# Patient Record
Sex: Female | Born: 1955 | Race: Black or African American | Hispanic: No | Marital: Single | Smoking: Former smoker
Health system: Southern US, Community
[De-identification: ages and names within clinical notes are randomized; demographics above are authoritative.]

## PROBLEM LIST (undated history)

## (undated) DIAGNOSIS — I10 Essential (primary) hypertension: Secondary | ICD-10-CM

## (undated) DIAGNOSIS — F32A Depression, unspecified: Secondary | ICD-10-CM

## (undated) DIAGNOSIS — J449 Chronic obstructive pulmonary disease, unspecified: Secondary | ICD-10-CM

## (undated) DIAGNOSIS — F329 Major depressive disorder, single episode, unspecified: Secondary | ICD-10-CM

## (undated) DIAGNOSIS — M1990 Unspecified osteoarthritis, unspecified site: Secondary | ICD-10-CM

## (undated) HISTORY — DX: Unspecified osteoarthritis, unspecified site: M19.90

## (undated) HISTORY — PX: ECTOPIC PREGNANCY SURGERY: SHX613

## (undated) HISTORY — PX: ABDOMINAL HYSTERECTOMY: SHX81

---

## 2009-02-09 ENCOUNTER — Emergency Department (HOSPITAL_COMMUNITY): Admission: EM | Admit: 2009-02-09 | Discharge: 2009-02-09 | Payer: Self-pay | Admitting: Emergency Medicine

## 2009-08-26 IMAGING — CR DG CHEST 2V
2 series · 2 of 2 positions shown · non-contrast
Comparison: None

CLINICAL DATA: Syncope

CHEST - 2 VIEW

[w chest pa]
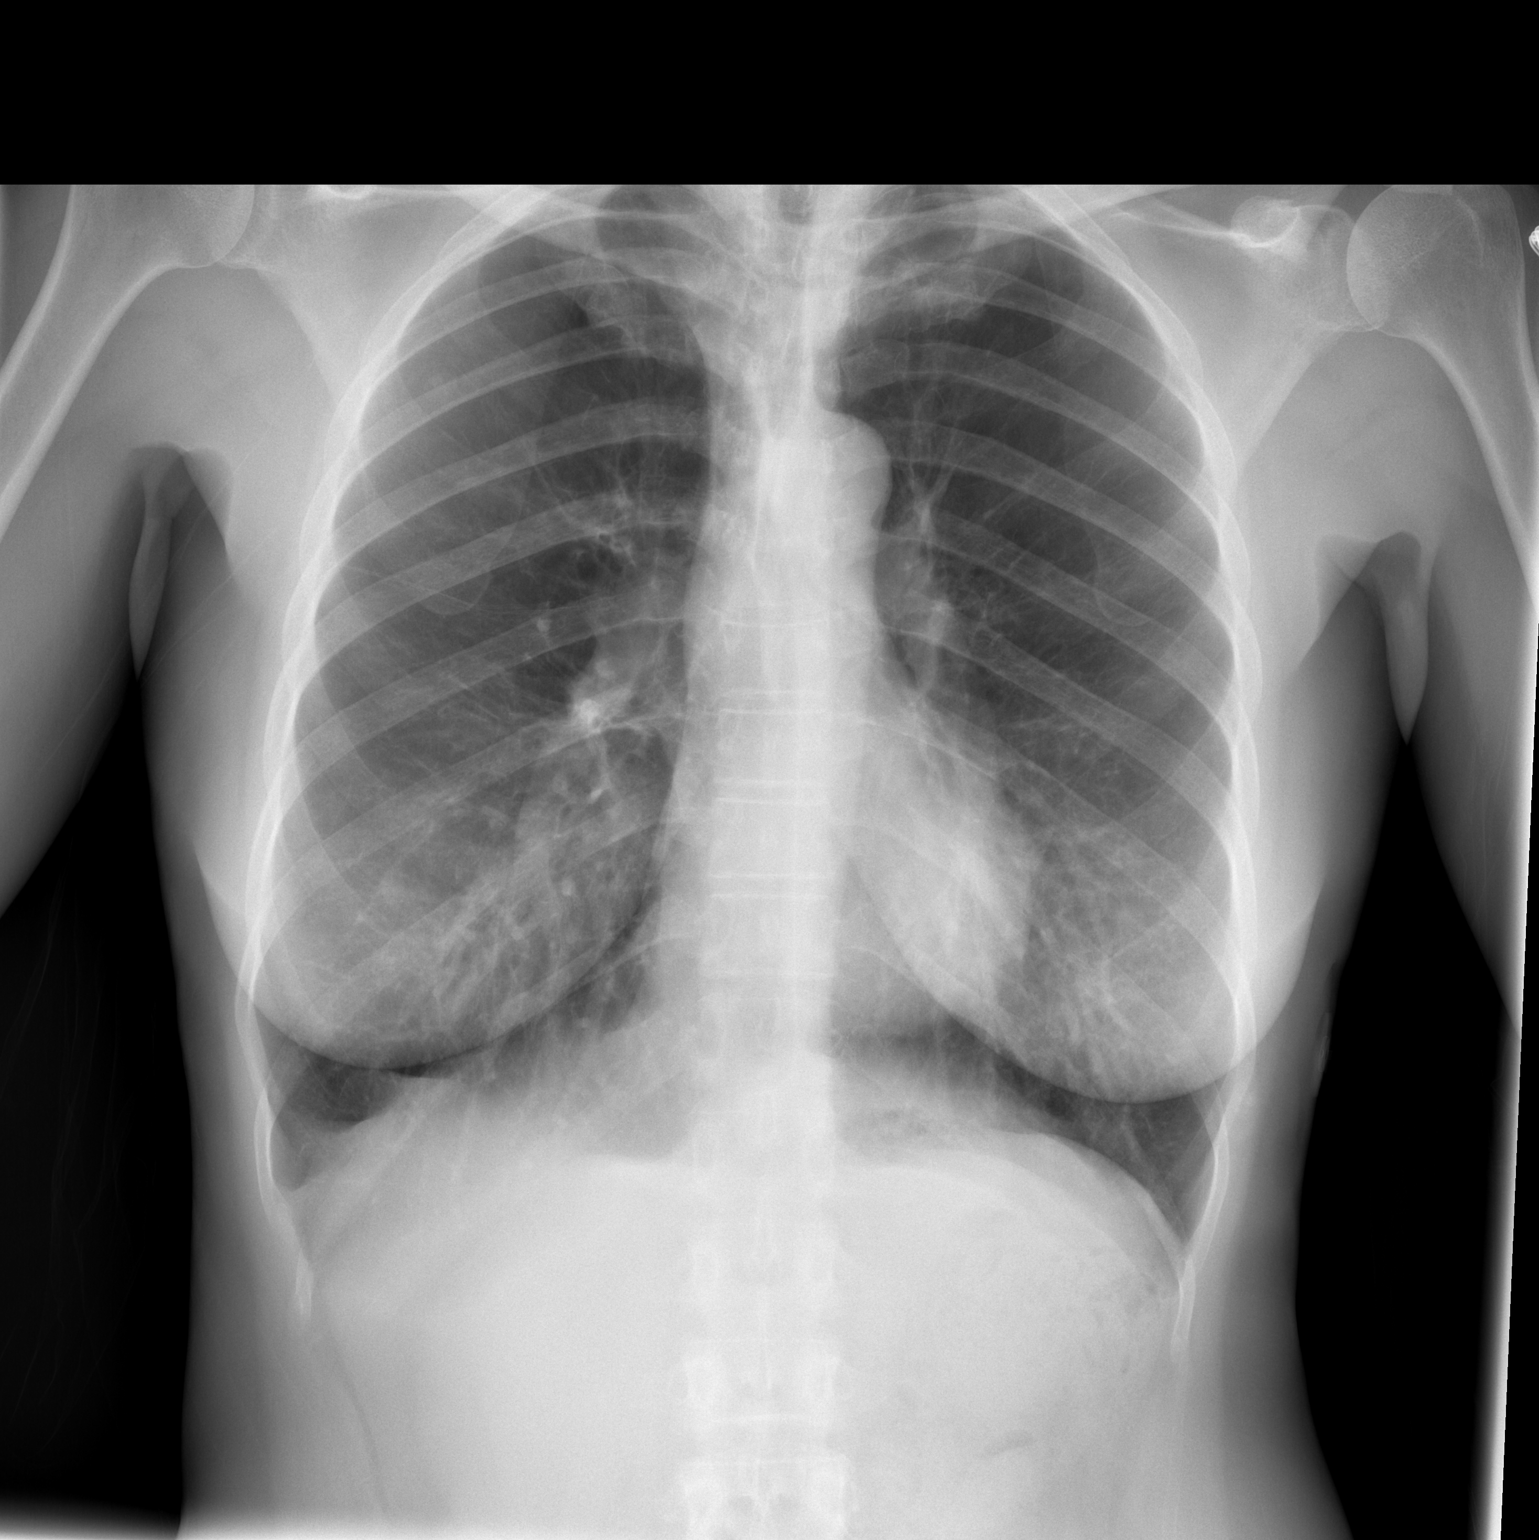

[w chest lat]
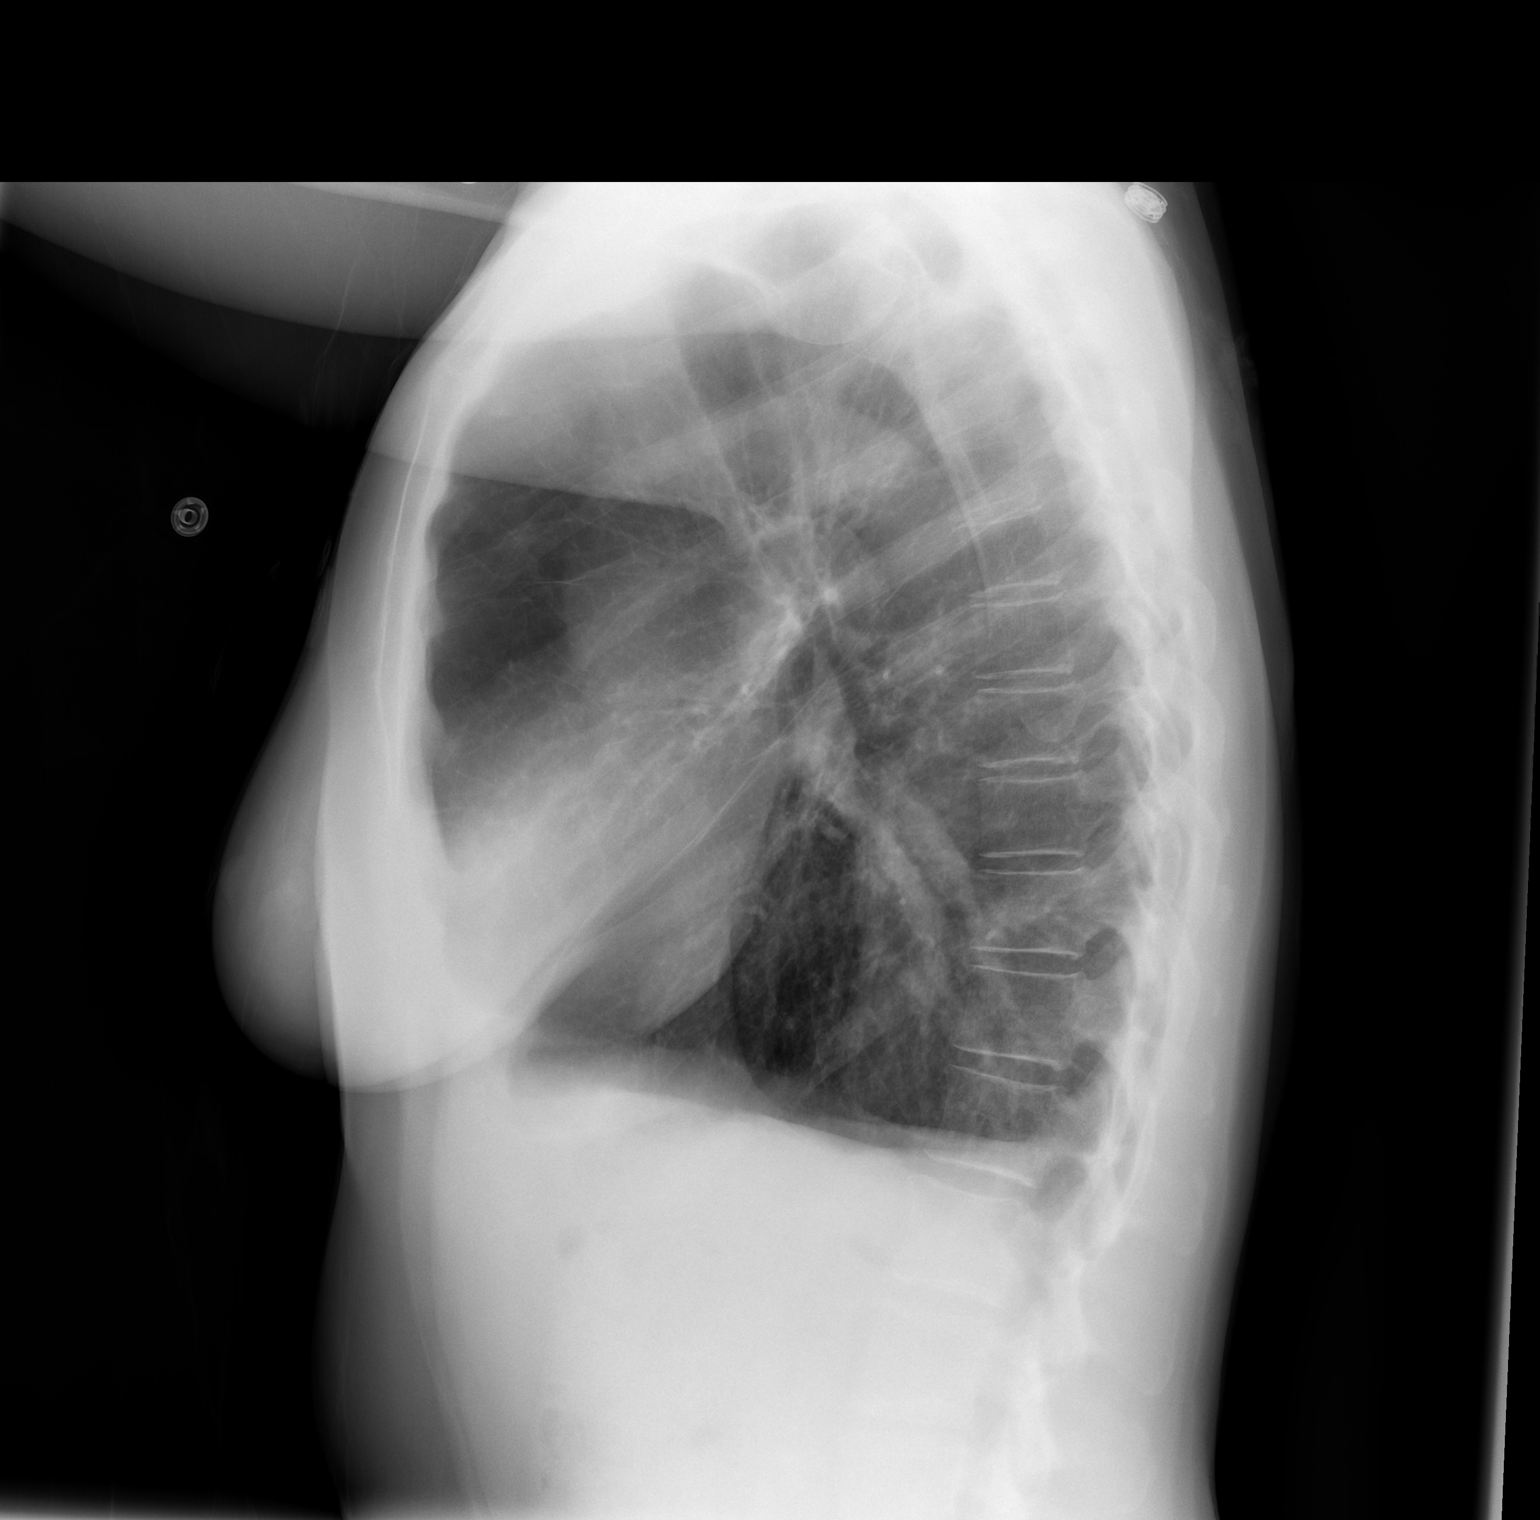

[2 of 2 positions shown; findings below may reference images not displayed]

FINDINGS: Both lungs are hyperinflated.  Flattening of the
hemidiaphragms and increased AP diameter of the chest are noted.
Attenuation of the lung markings in the upper lung fields is
present.  All of these findings suggest underlying COPD.  The heart
and mediastinal contours are within normal limits.  There is mild
bronchial wall thickening.  No airspace disease, effusion,
lymphadenopathy or pneumothorax is identified.

No acute osseous abnormality identified.
IMPRESSION: COPD.

## 2011-01-11 LAB — POCT I-STAT, CHEM 8
Calcium, Ion: 1.01 mmol/L — ABNORMAL LOW (ref 1.12–1.32)
Chloride: 102 mEq/L (ref 96–112)
HCT: 39 % (ref 36.0–46.0)
Potassium: 3.8 mEq/L (ref 3.5–5.1)

## 2011-01-11 LAB — CBC
MCHC: 33.4 g/dL (ref 30.0–36.0)
RDW: 13.3 % (ref 11.5–15.5)

## 2011-01-11 LAB — DIFFERENTIAL
Basophils Absolute: 0 10*3/uL (ref 0.0–0.1)
Basophils Relative: 0 % (ref 0–1)
Neutro Abs: 10.7 10*3/uL — ABNORMAL HIGH (ref 1.7–7.7)
Neutrophils Relative %: 91 % — ABNORMAL HIGH (ref 43–77)

## 2013-02-04 ENCOUNTER — Inpatient Hospital Stay: Payer: Self-pay | Admitting: Internal Medicine

## 2013-02-04 LAB — COMPREHENSIVE METABOLIC PANEL
Alkaline Phosphatase: 56 U/L (ref 50–136)
BUN: 6 mg/dL — ABNORMAL LOW (ref 7–18)
Bilirubin,Total: 0.4 mg/dL (ref 0.2–1.0)
Creatinine: 0.52 mg/dL — ABNORMAL LOW (ref 0.60–1.30)
EGFR (Non-African Amer.): >60
Glucose: 114 mg/dL — ABNORMAL HIGH (ref 65–99)
Osmolality: 278 (ref 275–301)
SGOT(AST): 43 U/L — ABNORMAL HIGH (ref 15–37)
SGPT (ALT): 37 U/L (ref 12–78)
Sodium: 140 mmol/L (ref 136–145)
Total Protein: 7.7 g/dL (ref 6.4–8.2)

## 2013-02-04 LAB — CBC
HGB: 13.9 g/dL (ref 12.0–16.0)
MCHC: 33.3 g/dL (ref 32.0–36.0)
MCV: 87 fL (ref 80–100)
RBC: 4.82 10*6/uL (ref 3.80–5.20)
RDW: 14.2 % (ref 11.5–14.5)
WBC: 3.1 10*3/uL — ABNORMAL LOW (ref 3.6–11.0)

## 2013-02-04 LAB — MAGNESIUM: Magnesium: 1.8 mg/dL

## 2013-02-04 LAB — TROPONIN I: Troponin-I: <0.02 ng/mL

## 2013-02-05 LAB — BASIC METABOLIC PANEL
Anion Gap: 6 — ABNORMAL LOW (ref 7–16)
BUN: 14 mg/dL (ref 7–18)
Calcium, Total: 9.1 mg/dL (ref 8.5–10.1)
Glucose: 144 mg/dL — ABNORMAL HIGH (ref 65–99)
Potassium: 4.1 mmol/L (ref 3.5–5.1)
Sodium: 141 mmol/L (ref 136–145)

## 2013-02-05 LAB — CBC WITH DIFFERENTIAL/PLATELET
Basophil %: 0.4 %
Eosinophil #: 0 10*3/uL (ref 0.0–0.7)
Eosinophil %: 0.1 %
MCHC: 33.2 g/dL (ref 32.0–36.0)
MCV: 87 fL (ref 80–100)
Monocyte #: 0.2 x10 3/mm (ref 0.2–0.9)
Monocyte %: 4.4 %
Neutrophil #: 2.9 10*3/uL (ref 1.4–6.5)
Neutrophil %: 78.8 %
Platelet: 261 10*3/uL (ref 150–440)
RBC: 4.48 10*6/uL (ref 3.80–5.20)
RDW: 14.3 % (ref 11.5–14.5)

## 2013-02-05 LAB — LIPID PANEL
Ldl Cholesterol, Calc: 128 mg/dL — ABNORMAL HIGH (ref 0–100)
VLDL Cholesterol, Calc: 10 mg/dL (ref 5–40)

## 2013-08-21 IMAGING — CR DG CHEST 1V PORT
1 series · 2 of 2 positions shown · non-contrast
Comparison: none

REASON FOR EXAM: dyspnea
COMMENTS:

PROCEDURE:     DXR - DXR PORTABLE CHEST SINGLE VIEW  - February 04, 2013 [DATE]
RESULT:     Comparison: None.

[Series 1: ap · 0.17mm/px · 2 of 2 slices shown]
[im 1/2]
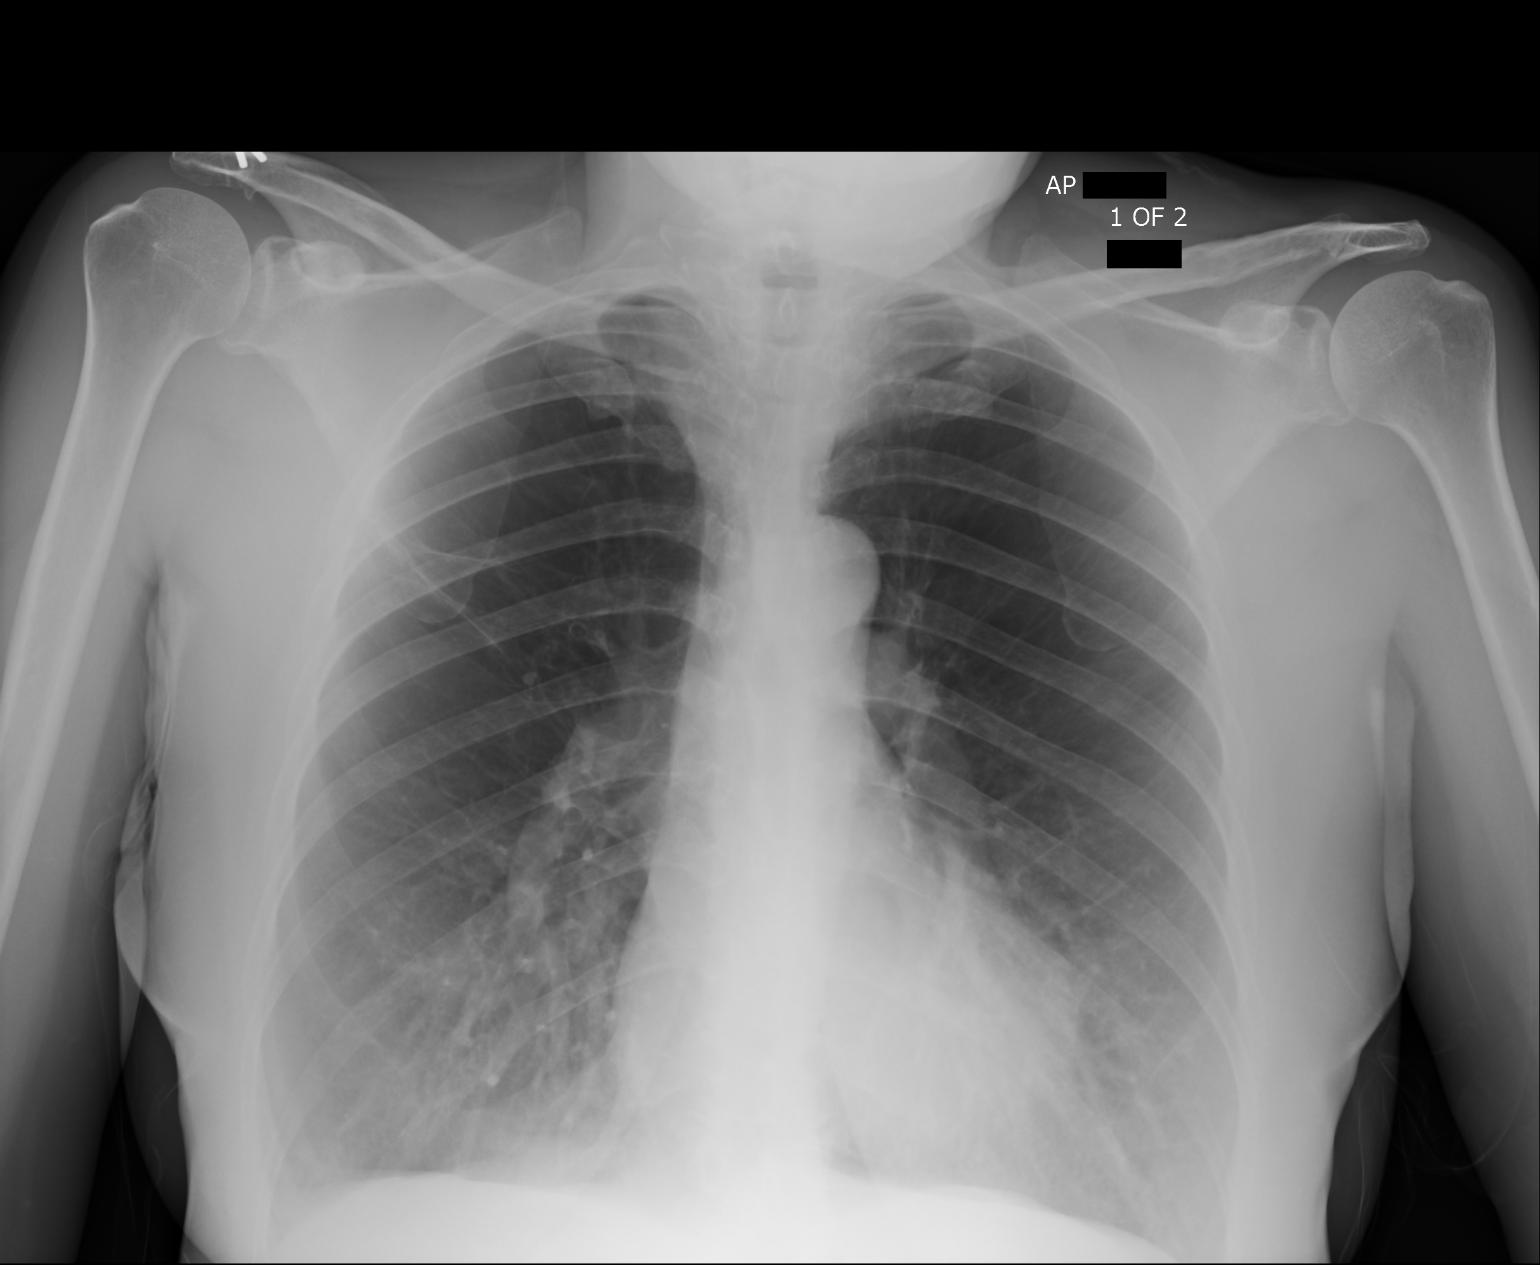
[im 2/2]
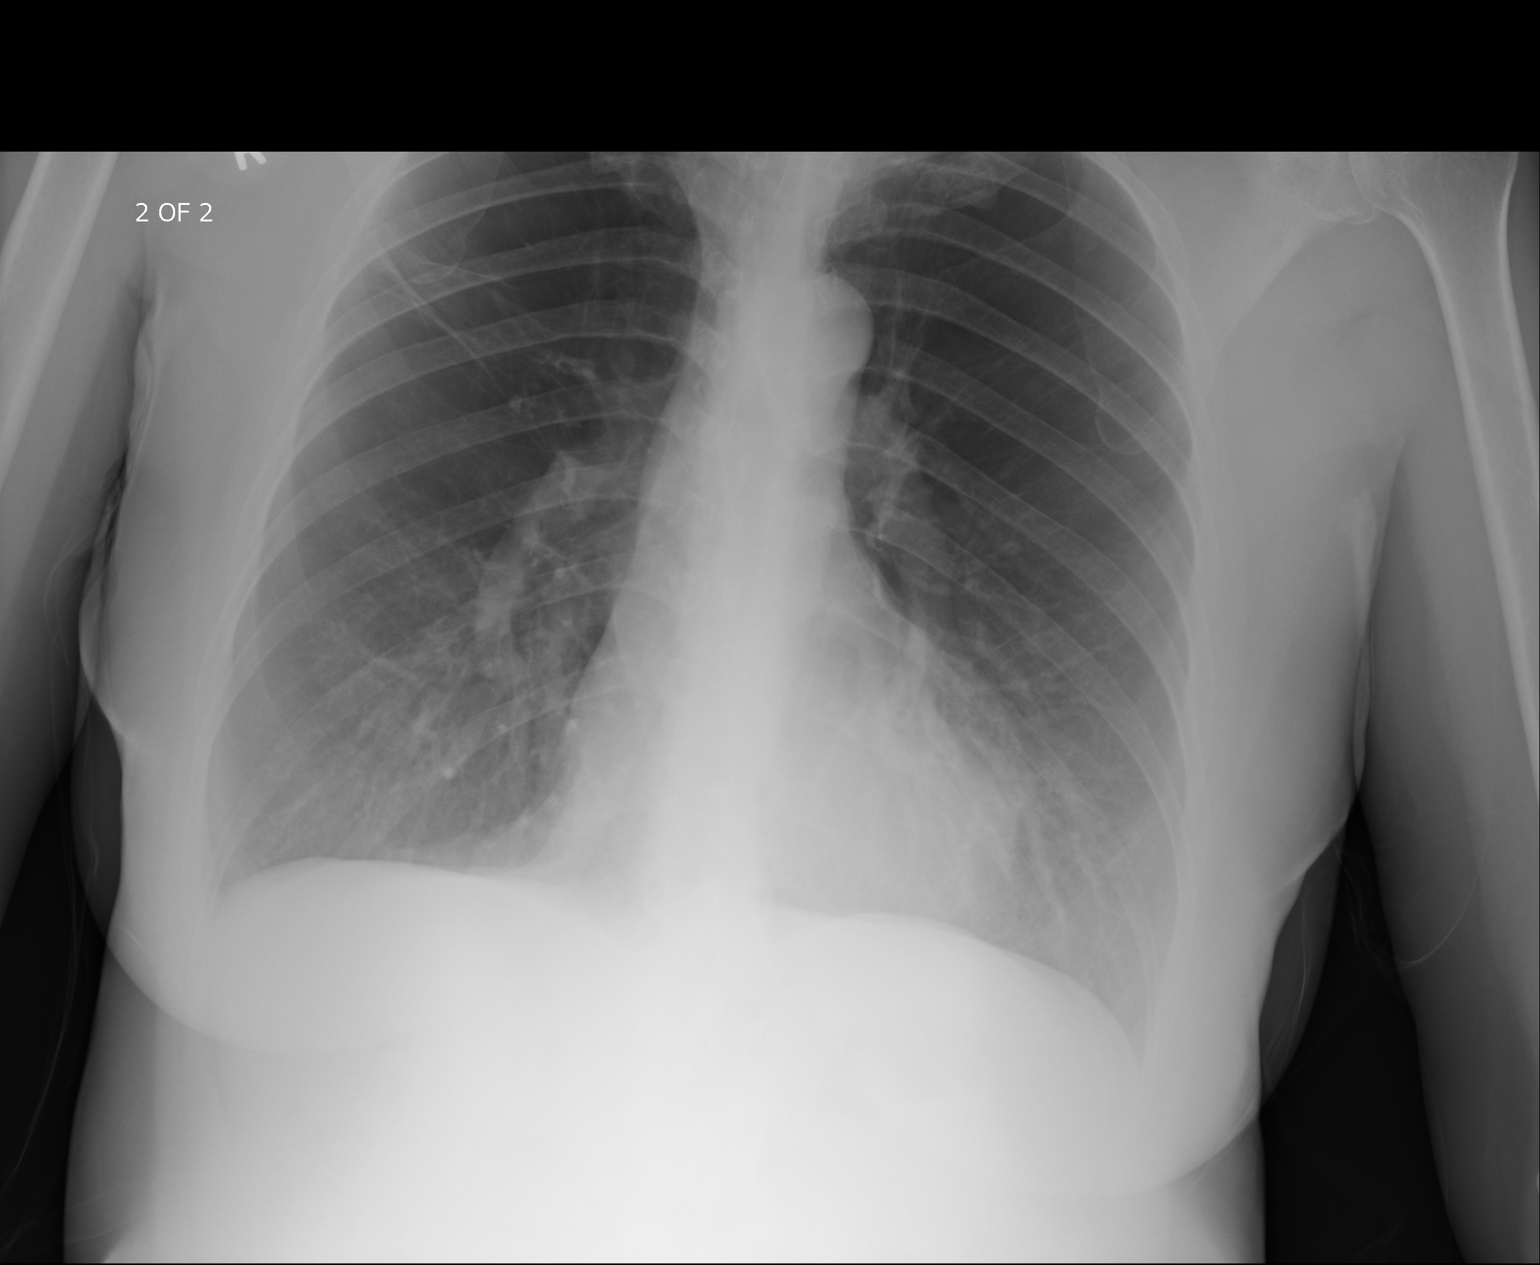

[2 of 2 positions shown; findings below may reference images not displayed]

FINDINGS: The heart and mediastinum are within normal limits. The lungs are
hyperinflated. Fine reticular opacities in the right mid upper lung are
likely secondary to atelectasis or scarring. Otherwise, no focal pulmonary
opacities.
IMPRESSION: Findings of COPD. Otherwise, no acute cardiopulmonary disease.

[REDACTED]

## 2013-08-26 IMAGING — CT CT CHEST W/ CM
1 series · 15 of 32 positions shown, 19 images · IV contrast (agent unspecified)
Comparison: none

REASON FOR EXAM: r/o PE, worsening shortness of breath and hypoxia
COMMENTS:

PROCEDURE:     CT  - CT CHEST WITH CONTRAST  - February 09, 2013 [DATE]
RESULT:
TECHNIQUE: Helical 3 mm sections were obtained from the thoracic inlet
through the lung bases status post intravenous administration of 100 mL of
Wsovue-CBQ.

[Series 7: lung windows · axial · 0.93mm/px · z∈[+784,+1036]mm · 15 of 96 slices shown, 19 images]
[im 8/96  mediastinal]
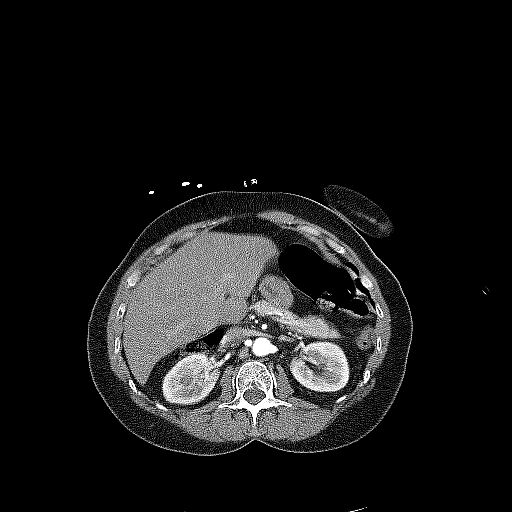
[im 8/96  lung]
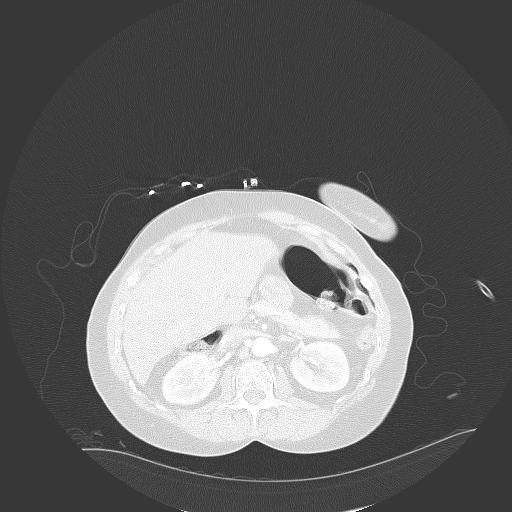
[im 15/96  lung]
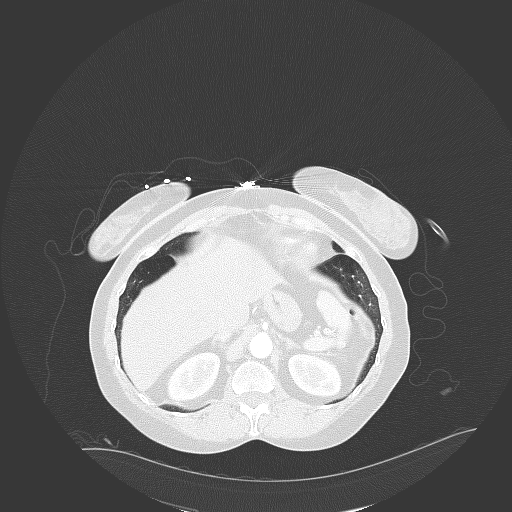
[im 20/96  lung]
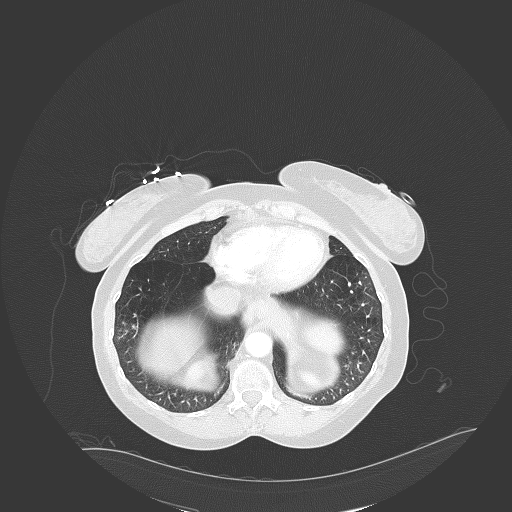
[im 25/96  lung]
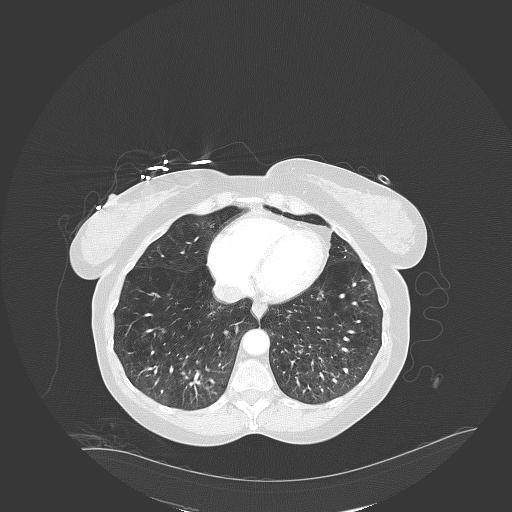
[im 32/96  mediastinal]
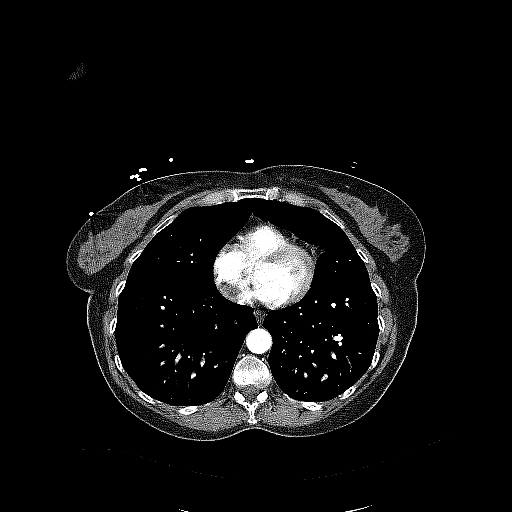
[im 32/96  lung]
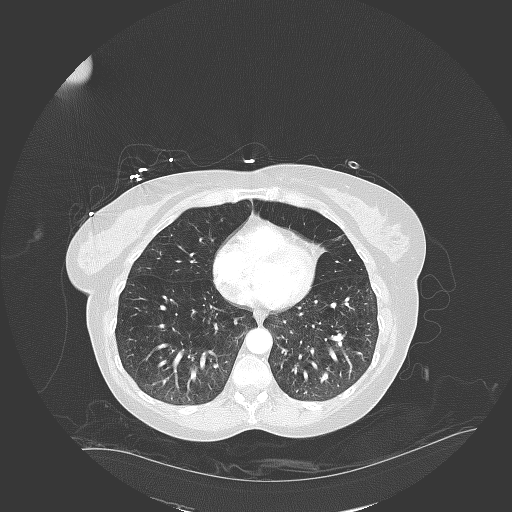
[im 39/96  lung]
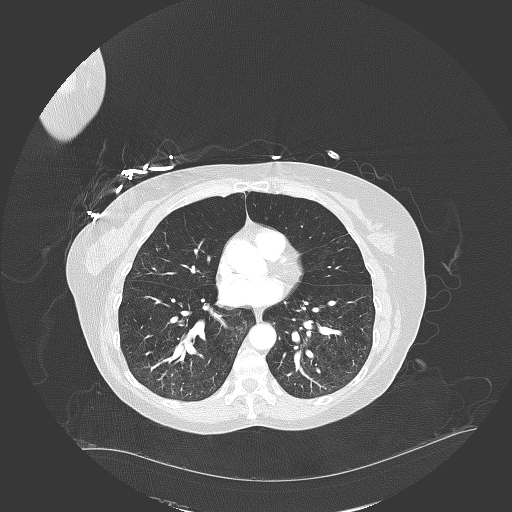
[im 46/96  lung]
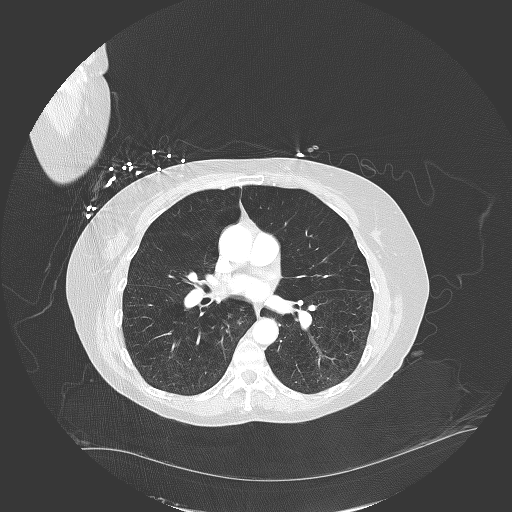
[im 51/96  lung]
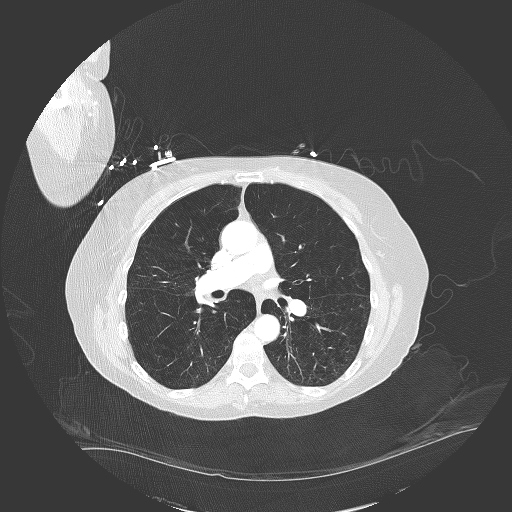
[im 57/96  mediastinal]
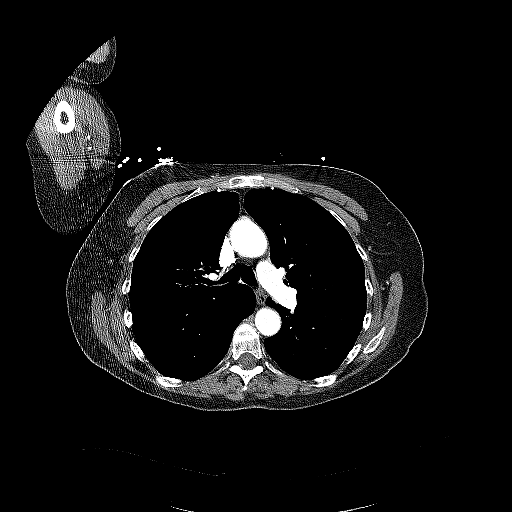
[im 57/96  lung]
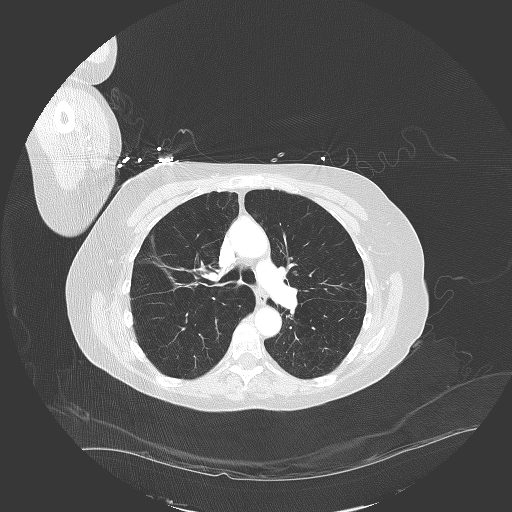
[im 60/96  lung]
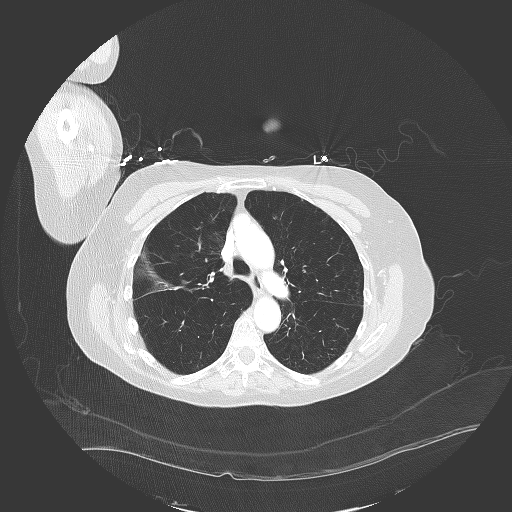
[im 67/96  lung]
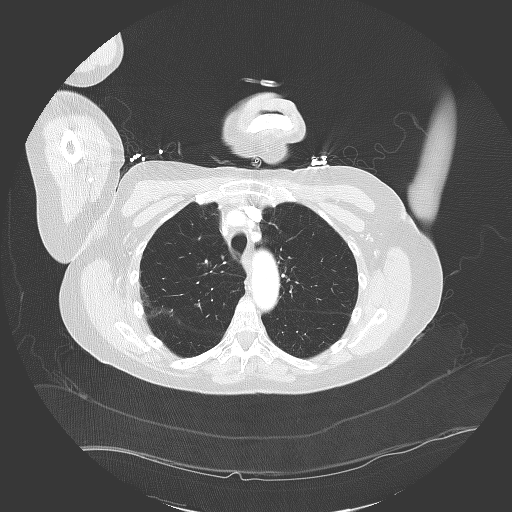
[im 74/96  lung]
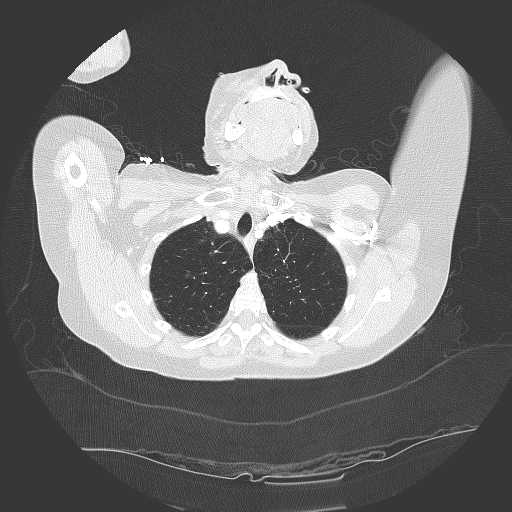
[im 78/96  mediastinal]
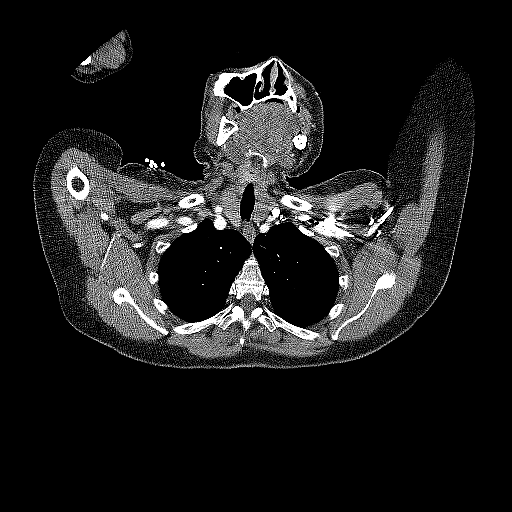
[im 78/96  lung]
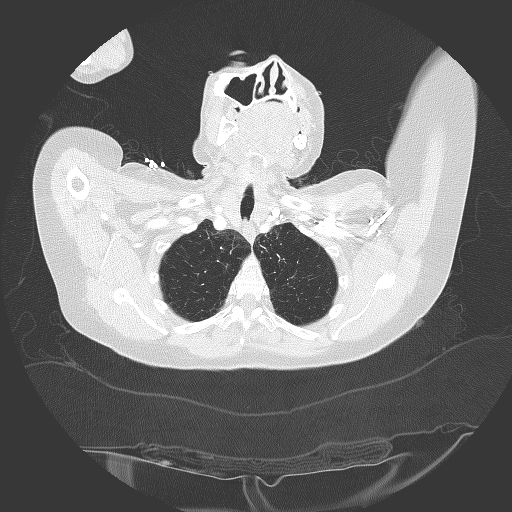
[im 85/96  lung]
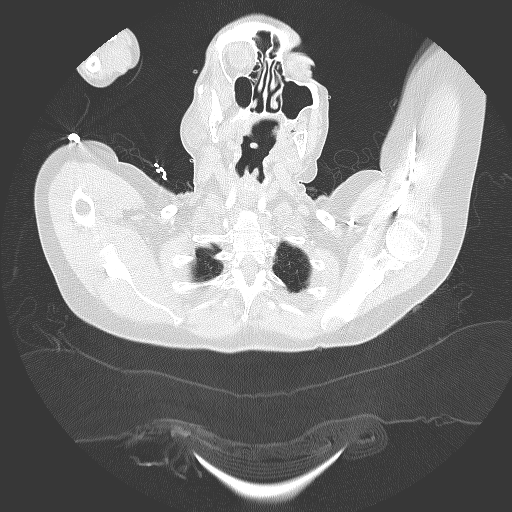
[im 92/96  lung]
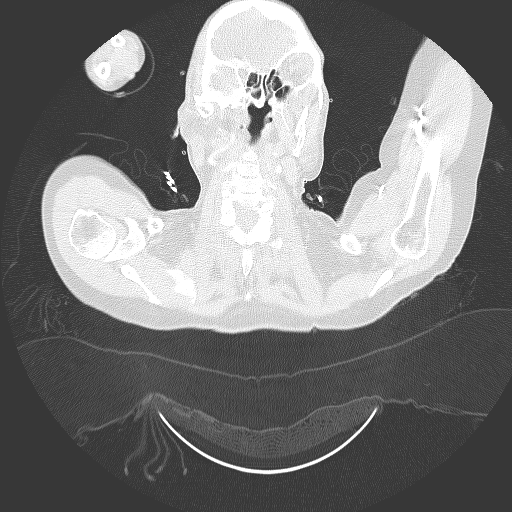

[15 of 32 positions shown; findings below may reference images not displayed]

FINDINGS: Evaluation of the oropharynx demonstrates findings which may
represent increased soft tissue density along the posterolateral
parapharyngeal region on the left. This finding may be secondary to
positioning and clinical correlation recommended.

The mediastinum and hilar regions and structures demonstrate no evidence of
mediastinal or hilar masses or adenopathy. There is no evidence of filling
defects within the main, lobar, or segmental pulmonary arteries.

Emphysematous changes are identified throughout both lungs with greatest
confluence in the lung apices. Interstitial and emphysematous changes are
identified within the lung bases. A 7.5 mm nodule projects in the posterior
to central portion of the left lower lobe in the region of the medial median
basal segment measured on image #75 of the lung window series. A linear area
of increased density projects within the lateral segment right middle lobe.

The visualized upper abdominal viscera demonstrate low attenuating foci
within the kidneys like representing cysts and otherwise grossly
unremarkable.
IMPRESSION: 1. No CT evidence of pulmonary arterial embolic disease.
2. Moderate emphysematous changes within the upper lobe regions.
3. Moderate emphysematous and interstitial changes within the lung bases.
4. Indeterminate pulmonary nodule left lung base. Clinical correlation
recommended and, if warranted, surveillance evaluation is recommended.
5. Likely scarring within the right middle lobe.

## 2013-11-09 ENCOUNTER — Inpatient Hospital Stay: Payer: Self-pay | Admitting: Internal Medicine

## 2013-11-09 LAB — COMPREHENSIVE METABOLIC PANEL
ALT: 18 U/L (ref 12–78)
ANION GAP: 9 (ref 7–16)
AST: 23 U/L (ref 15–37)
Albumin: 3 g/dL — ABNORMAL LOW (ref 3.4–5.0)
Alkaline Phosphatase: 99 U/L
BUN: 18 mg/dL (ref 7–18)
Bilirubin,Total: 1.1 mg/dL — ABNORMAL HIGH (ref 0.2–1.0)
CALCIUM: 9 mg/dL (ref 8.5–10.1)
Chloride: 97 mmol/L — ABNORMAL LOW (ref 98–107)
Co2: 25 mmol/L (ref 21–32)
Creatinine: 0.91 mg/dL (ref 0.60–1.30)
EGFR (Non-African Amer.): >60
Glucose: 97 mg/dL (ref 65–99)
OSMOLALITY: 264 (ref 275–301)
Potassium: 3.2 mmol/L — ABNORMAL LOW (ref 3.5–5.1)
SODIUM: 131 mmol/L — AB (ref 136–145)
TOTAL PROTEIN: 7.5 g/dL (ref 6.4–8.2)

## 2013-11-09 LAB — CBC WITH DIFFERENTIAL/PLATELET
Basophil #: 0.1 10*3/uL (ref 0.0–0.1)
Basophil %: 0.5 %
EOS ABS: 0 10*3/uL (ref 0.0–0.7)
Eosinophil %: 0 %
HCT: 39.6 % (ref 35.0–47.0)
HGB: 13 g/dL (ref 12.0–16.0)
Lymphocyte #: 0.9 10*3/uL — ABNORMAL LOW (ref 1.0–3.6)
Lymphocyte %: 3.9 %
MCH: 28 pg (ref 26.0–34.0)
MCHC: 32.8 g/dL (ref 32.0–36.0)
MCV: 86 fL (ref 80–100)
MONOS PCT: 3.7 %
Monocyte #: 0.9 x10 3/mm (ref 0.2–0.9)
NEUTROS ABS: 21.8 10*3/uL — AB (ref 1.4–6.5)
Neutrophil %: 91.9 %
Platelet: 238 10*3/uL (ref 150–440)
RBC: 4.63 10*6/uL (ref 3.80–5.20)
RDW: 15 % — ABNORMAL HIGH (ref 11.5–14.5)
WBC: 23.8 10*3/uL — AB (ref 3.6–11.0)

## 2013-11-09 LAB — TROPONIN I: Troponin-I: <0.02 ng/mL

## 2013-11-10 LAB — CBC WITH DIFFERENTIAL/PLATELET
BASOS ABS: 0.1 10*3/uL (ref 0.0–0.1)
Basophil %: 0.5 %
EOS PCT: 0 %
Eosinophil #: 0 10*3/uL (ref 0.0–0.7)
HCT: 38.8 % (ref 35.0–47.0)
HGB: 12.7 g/dL (ref 12.0–16.0)
Lymphocyte #: 0.9 10*3/uL — ABNORMAL LOW (ref 1.0–3.6)
Lymphocyte %: 4.4 %
MCH: 28.7 pg (ref 26.0–34.0)
MCHC: 32.7 g/dL (ref 32.0–36.0)
MCV: 88 fL (ref 80–100)
MONO ABS: 0.8 x10 3/mm (ref 0.2–0.9)
Monocyte %: 3.6 %
NEUTROS PCT: 91.5 %
Neutrophil #: 19.5 10*3/uL — ABNORMAL HIGH (ref 1.4–6.5)
PLATELETS: 214 10*3/uL (ref 150–440)
RBC: 4.42 10*6/uL (ref 3.80–5.20)
RDW: 15.3 % — ABNORMAL HIGH (ref 11.5–14.5)
WBC: 21.3 10*3/uL — AB (ref 3.6–11.0)

## 2013-11-10 LAB — BASIC METABOLIC PANEL
ANION GAP: 15 (ref 7–16)
BUN: 19 mg/dL — ABNORMAL HIGH (ref 7–18)
CHLORIDE: 100 mmol/L (ref 98–107)
CREATININE: 1.05 mg/dL (ref 0.60–1.30)
Calcium, Total: 8.7 mg/dL (ref 8.5–10.1)
Co2: 20 mmol/L — ABNORMAL LOW (ref 21–32)
GFR CALC NON AF AMER: 58 — AB
Glucose: 72 mg/dL (ref 65–99)
OSMOLALITY: 271 (ref 275–301)
Potassium: 2.9 mmol/L — ABNORMAL LOW (ref 3.5–5.1)
Sodium: 135 mmol/L — ABNORMAL LOW (ref 136–145)

## 2013-11-10 LAB — CK-MB
CK-MB: 1.8 ng/mL (ref 0.5–3.6)
CK-MB: 2.6 ng/mL (ref 0.5–3.6)

## 2013-11-10 LAB — THEOPHYLLINE LEVEL: Theophylline: 7.2 ug/mL — ABNORMAL LOW (ref 10.0–20.0)

## 2013-11-10 LAB — TROPONIN I

## 2013-11-10 LAB — MAGNESIUM: MAGNESIUM: 1.3 mg/dL — AB

## 2013-11-11 LAB — MAGNESIUM: Magnesium: 2.6 mg/dL — ABNORMAL HIGH

## 2013-11-11 LAB — BASIC METABOLIC PANEL
ANION GAP: 7 (ref 7–16)
BUN: 13 mg/dL (ref 7–18)
CALCIUM: 8.7 mg/dL (ref 8.5–10.1)
CREATININE: 0.72 mg/dL (ref 0.60–1.30)
Chloride: 99 mmol/L (ref 98–107)
Co2: 25 mmol/L (ref 21–32)
GLUCOSE: 85 mg/dL (ref 65–99)
OSMOLALITY: 262 (ref 275–301)
Potassium: 2.9 mmol/L — ABNORMAL LOW (ref 3.5–5.1)
SODIUM: 131 mmol/L — AB (ref 136–145)

## 2013-11-11 LAB — POTASSIUM: Potassium: 3.6 mmol/L (ref 3.5–5.1)

## 2013-11-12 LAB — BASIC METABOLIC PANEL
ANION GAP: 6 — AB (ref 7–16)
BUN: 14 mg/dL (ref 7–18)
CALCIUM: 8.9 mg/dL (ref 8.5–10.1)
CO2: 24 mmol/L (ref 21–32)
CREATININE: 0.7 mg/dL (ref 0.60–1.30)
Chloride: 102 mmol/L (ref 98–107)
EGFR (African American): >60
EGFR (Non-African Amer.): >60
GLUCOSE: 125 mg/dL — AB (ref 65–99)
Osmolality: 266 (ref 275–301)
Potassium: 3.4 mmol/L — ABNORMAL LOW (ref 3.5–5.1)
SODIUM: 132 mmol/L — AB (ref 136–145)

## 2013-11-12 LAB — CBC WITH DIFFERENTIAL/PLATELET
BASOS ABS: 0 10*3/uL (ref 0.0–0.1)
Basophil %: 0.1 %
EOS ABS: 0 10*3/uL (ref 0.0–0.7)
EOS PCT: 0 %
HCT: 34.3 % — ABNORMAL LOW (ref 35.0–47.0)
HGB: 11.5 g/dL — ABNORMAL LOW (ref 12.0–16.0)
LYMPHS ABS: 0.3 10*3/uL — AB (ref 1.0–3.6)
LYMPHS PCT: 2.6 %
MCH: 28.8 pg (ref 26.0–34.0)
MCHC: 33.4 g/dL (ref 32.0–36.0)
MCV: 86 fL (ref 80–100)
MONOS PCT: 6.2 %
Monocyte #: 0.8 x10 3/mm (ref 0.2–0.9)
NEUTROS ABS: 11.8 10*3/uL — AB (ref 1.4–6.5)
NEUTROS PCT: 91.1 %
Platelet: 225 10*3/uL (ref 150–440)
RBC: 3.99 10*6/uL (ref 3.80–5.20)
RDW: 15.1 % — ABNORMAL HIGH (ref 11.5–14.5)
WBC: 12.9 10*3/uL — ABNORMAL HIGH (ref 3.6–11.0)

## 2013-11-14 LAB — CULTURE, BLOOD (SINGLE)

## 2014-05-26 IMAGING — CR DG CHEST 2V
1 series · 2 of 2 positions shown · non-contrast
Comparison: 02/04/2013 chest radiograph

CLINICAL DATA: 57-year-old female with shortness of breath.

EXAM:
CHEST  2 VIEW

[Series 1: w chest lat · 0.14mm/px · 2 of 2 slices shown]
[im 1/2]
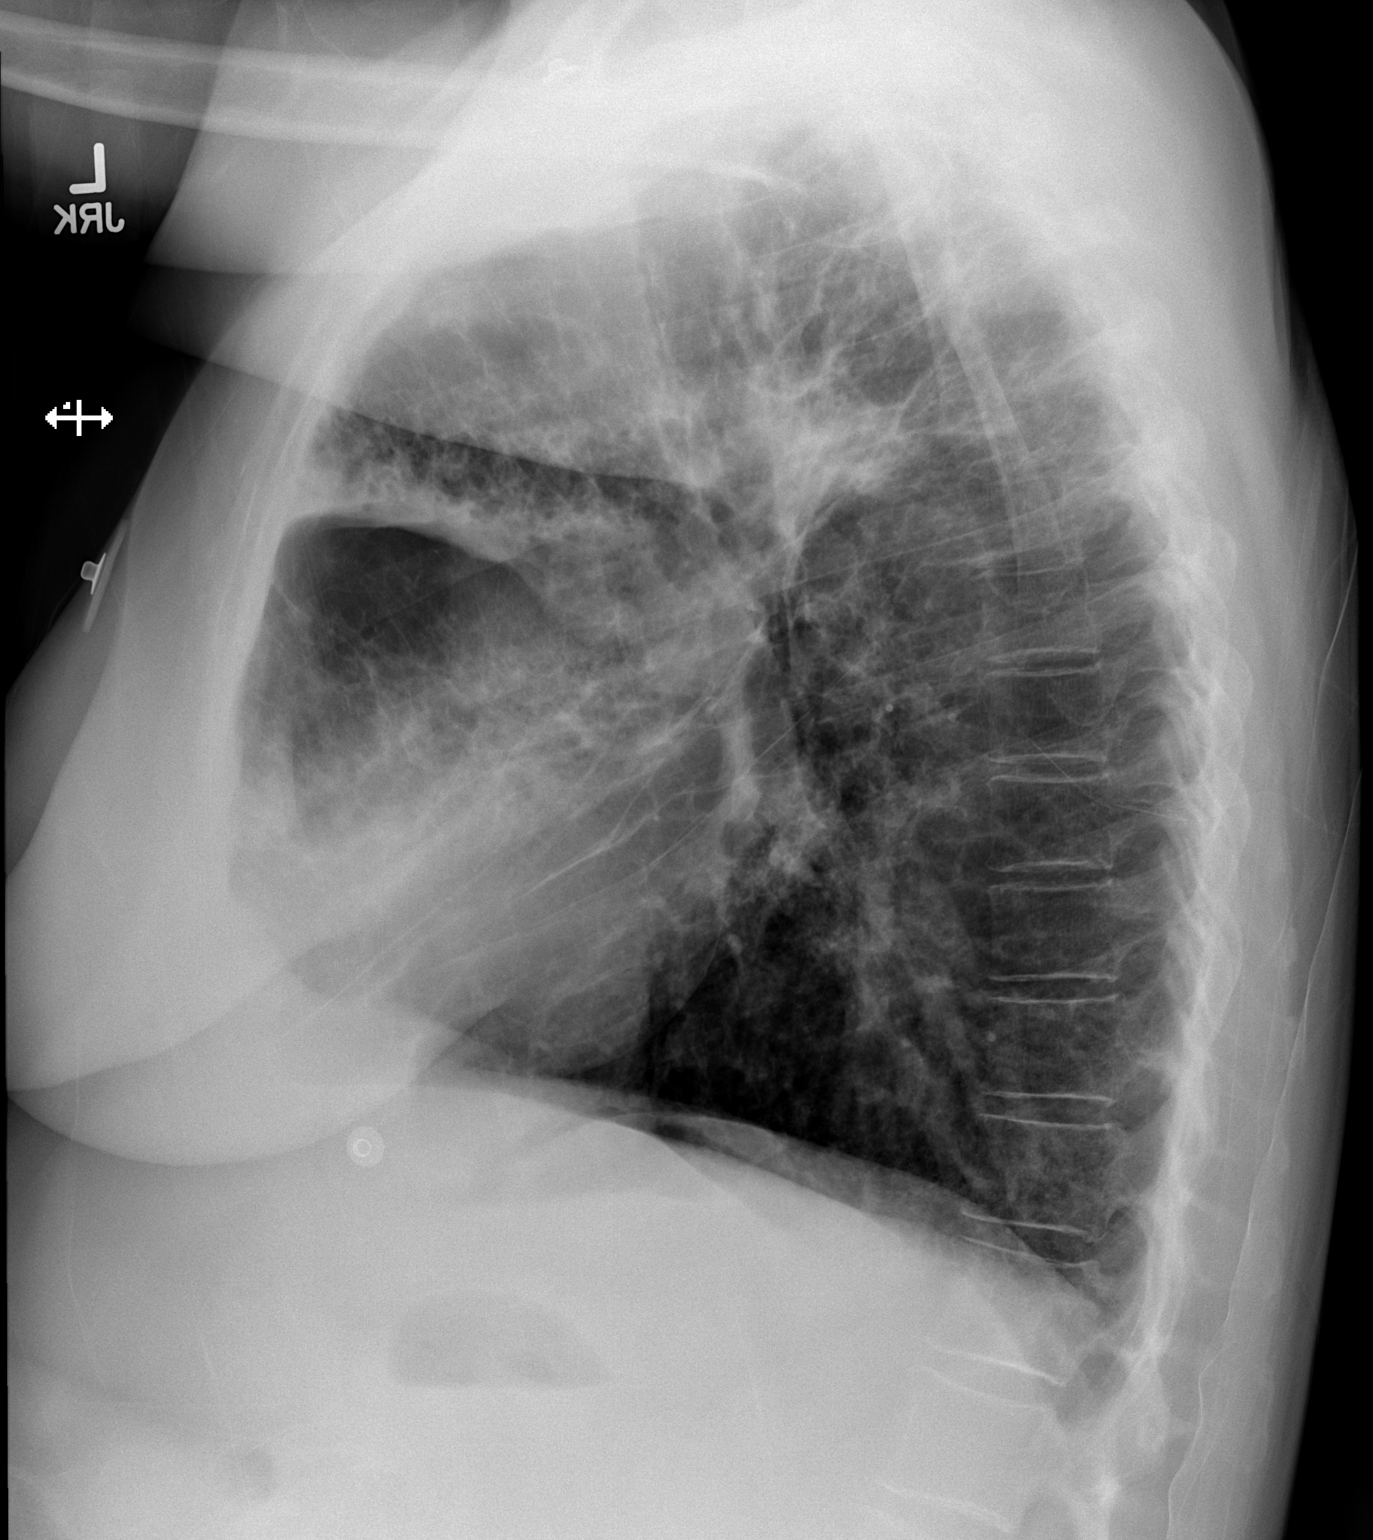
[im 2/2]
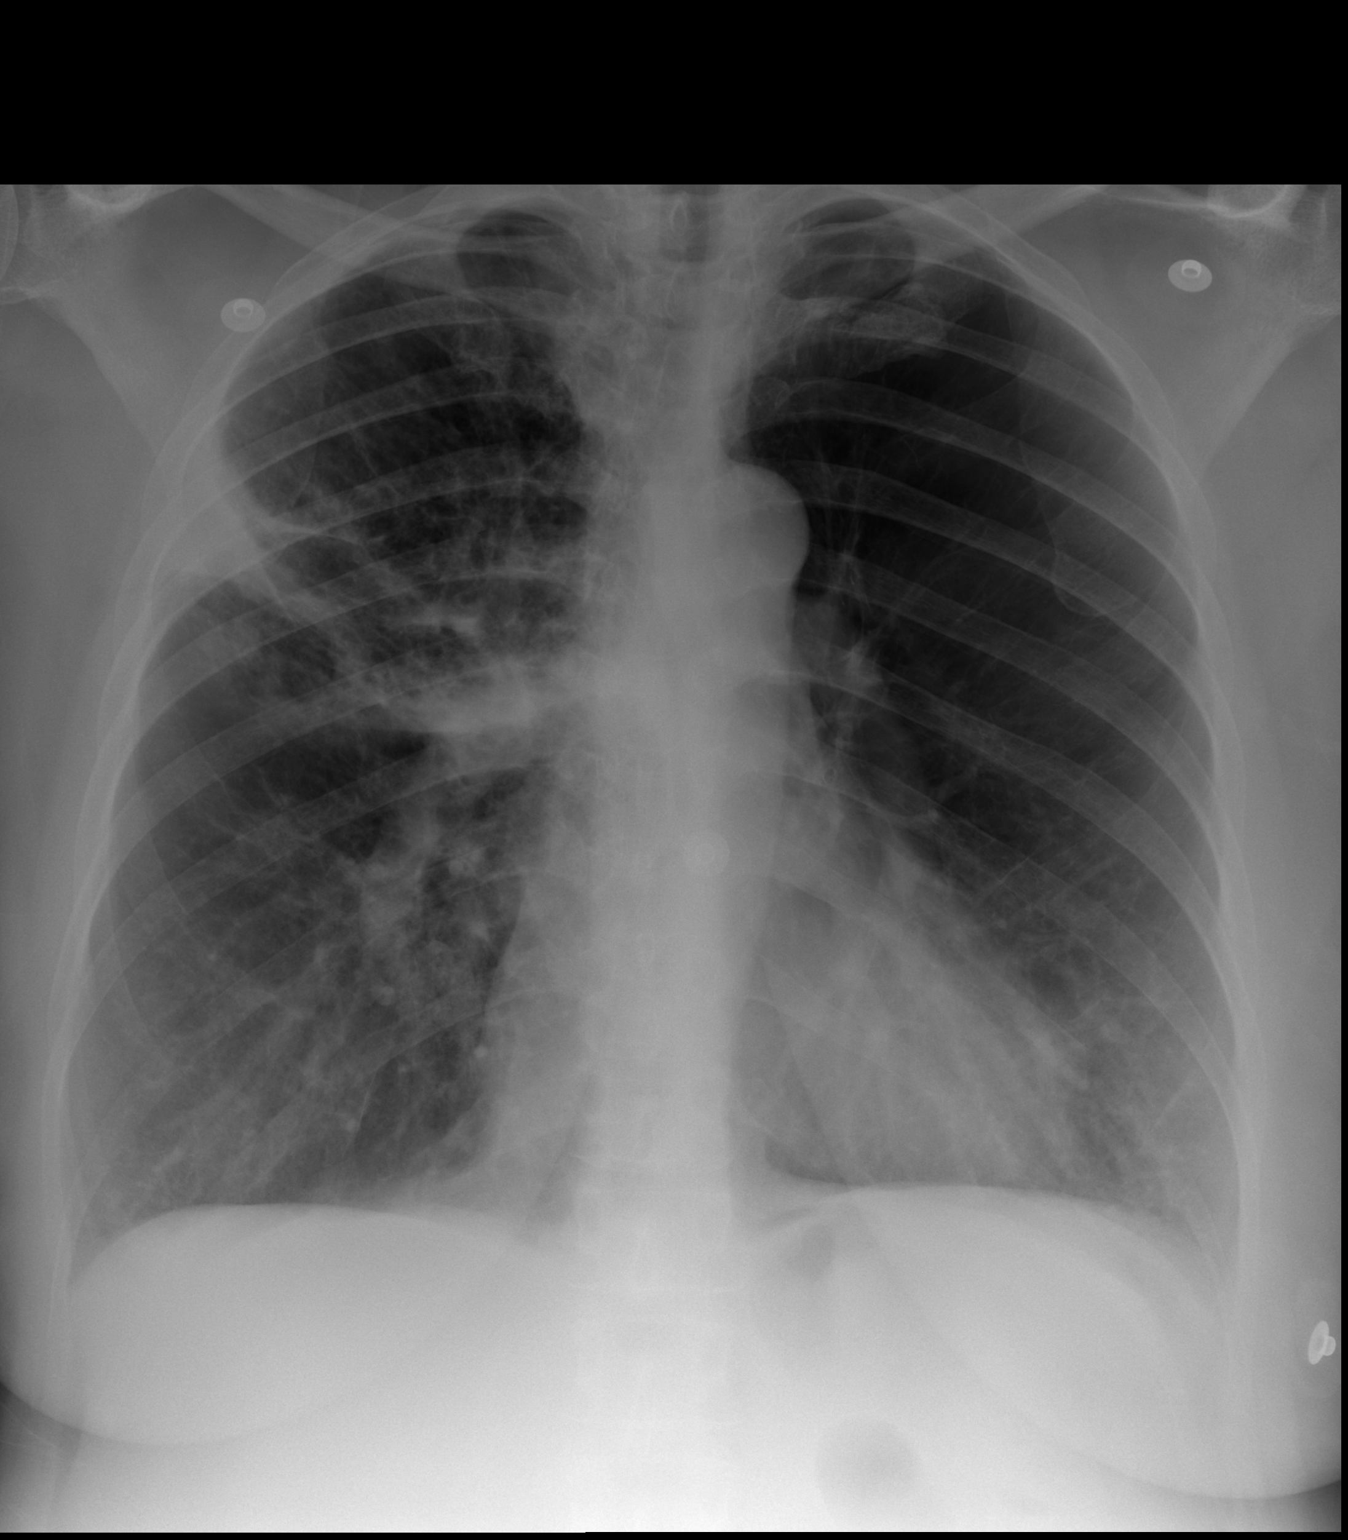

[2 of 2 positions shown; findings below may reference images not displayed]

FINDINGS: Airspace disease within the right upper and middle lobes noted
compatible with pneumonia.

Cardiomediastinal silhouette is otherwise clear.

The left lung is clear.

COPD/ emphysema identified.

There is no evidence of pneumothorax or pleural effusion.
IMPRESSION: Right upper and middle lobe airspace disease likely representing
pneumonia. Radiographic follow-up to resolution is recommended.

COPD/emphysema.

## 2015-01-23 NOTE — Discharge Summary (Signed)
PATIENT NAME:  Carla Rivera, Carla Rivera MR#:  [id number] DATE OF BIRTH:  1956/04/22  DATE OF ADMISSION:  02/04/2013 DATE OF DISCHARGE:  02/10/2013  DISCHARGE DIAGNOSES: 1.  Acute respiratory failure, likely due to chronic obstructive pulmonary disease exacerbation, now much-improved.  2. Chronic obstructive pulmonary disease exacerbation, improving on steroids and other medication.  3.  Hypertension, improving on hydrochlorothiazide.  4.  Hypokalemia, repleted and resolved.  5.  Depression, on Celexa and improving mood. 6.  Hypoxia, due to chronic obstructive pulmonary and respiratory failure, requires oxygen on ambulation.   SECONDARY DIAGNOSIS: Chronic obstructive pulmonary disease.   CONSULTATIONS: None.   PROCEDURES/RADIOLOGY: CT scan of the chest with contrast on 10th of May showed: Moderate emphysematous disease in the upper lobes. Moderate emphysematous and interstitial changes within the lung bases. Indeterminate pulmonary nodule at the left lung base. Scarring  within the right middle lobe.   Chest x-ray on 5th of May showed findings consistent with COPD. No acute cardiopulmonary disease.   MAJOR LABORATORY PANEL: None.   HISTORY AND SHORT HOSPITAL COURSE: The patient is a 59 year old female with the above-mentioned medical problems who was admitted for acute-on-chronic respiratory failure secondary to COPD exacerbation. Please see Dr. Mathews Robinsons dictated history and physical for further details.   The patient was started on steroids, Symbicort, Spiriva, and breathing treatments. CT scan of the chest was also obtained for ruling out pulmonary embolism and evaluate her lungs. It was negative for a pulmonary embolism. She was found to have uncontrolled hypertension, for which hydrochlorothiazide was started and seemed to help her blood pressure. She also had some hypokalemia which was repleted and resolved, and she was found to be depressed, for which she was started on Celexa at a low dose,  and she seemed to be responding well. On the 11th of May she was close to her baseline and is being discharged on  1 liter of oxygen via nasal cannula, mainly at ambulation, in stable condition.   PHYSICAL EXAMINATION: VITAL SIGNS: On the date of discharge her vital signs were as follows: Temperature 98.5, heart rate 89 per minute, respirations 18 per minute, blood pressure 134/82. She was saturating 95% on room air at rest, but she was desaturating to 85% to 87% on exertion and was placed on 1 liter of   oxygen via nasal cannula.   Pertinent physical examination on the date of discharge:   CARDIOVASCULAR: S1, S2 normal. No murmurs, rubs, or gallops.  LUNGS: Clear to auscultation bilaterally. No wheezing, rales, rhonchi, or crepitations.  ABDOMEN: Soft, benign.  NEUROLOGIC: Nonfocal examination.   Rest of the examination remained at baseline.   DISCHARGE MEDICATIONS: 1.  Symbicort 2 puffs inhaled twice a day.  2.  Prednisone 60 mg p.o. daily, taper 10 mg daily until finished.  3.  Citalopram 10 mg p.o. daily.  4.  Theophylline 200 mg p.o. b.i.d.  5.  Spiriva, once daily.  6.  Hydrochlorothiazide 25 mg p.o. daily.   DISCHARGE DIET: Low-sodium, low-fat, low-cholesterol.   DISCHARGE ACTIVITY: As tolerated.   DISCHARGE INSTRUCTIONS AND FOLLOWUP: The patient was instructed to follow up with her primary care physician, Dr. Maryellen Rivera, in 1 to 2 weeks. She will need followup with pulmonary, Dr. Meredeth Rivera, in 2 to 4 weeks. She will need 1 liter of oxygen via nasal cannula, mainly on ambulation, set up by care management.   Total time discharging this patient was 45 minutes.    ____________________________ Carla Rivera. Sherryll Burger, MD vss:dm D: 02/10/2013 11:40:38 ET T: 02/11/2013  08:54:48 ET JOB#: [id number]  cc: Carla Rivera Rivera. Sherryll BurgerShah, MD, <Dictator> Carla ShellerErnest B. Maryellen PileEason, MD Carla E. Meredeth IdeFleming, MD Carla SiaVIPUL Rivera [facility] MD ELECTRONICALLY SIGNED 02/15/2013 14:14

## 2015-01-23 NOTE — H&P (Signed)
PATIENT NAME:  Carla Rivera, Carla Rivera MR#:  [id number] DATE OF BIRTH:  [birth year]  DATE OF ADMISSION:  02/04/2013  PRIMARY CARE PHYSICIAN:  Toy Cookey, MD  CHIEF COMPLAINT: Shortness of breath.   HISTORY OF PRESENT ILLNESS: This is a 59 year old female who has been having trouble with bronchitis and COPD. She let it get out of hand.  It has been going on for a week, gradually getting worse throughout the week. She went to urgent care today and was sent in to the ER for further evaluation. She got to the point where she just could not breathe. She has been congested, coughing up whitish phlegm. She was found to have a pulse ox of 88% on room air and dropped down to 86 with ambulation. Hospitalist services were contacted for further evaluation.   PAST MEDICAL HISTORY: Chronic obstructive pulmonary disease.   PAST SURGICAL HISTORY: Tubal pregnancy.   ALLERGIES: No known drug allergies.   MEDICATIONS: As per prescription writer include Symbicort 164.5 two puffs twice a day.   SOCIAL HISTORY: Smoking history: One pack per day, then cut back to half a day. States that she stopped smoking around 2 weeks ago. No alcohol. No drug use. She works in English as a second language teacher at [employer].   FAMILY HISTORY: Mother died at 38 of multiple issues. She was on dialysis and had hypertension. Father unknown medical history.   REVIEW OF SYSTEMS: CONSTITUTIONAL: No fever and no sweats, but she does have hot flashes. Positive for weakness.  EYES: She does wear reading glasses.  EARS, NOSE, MOUTH, AND THROAT: No hearing loss. No sore throat. No difficulty swallowing.  CARDIOVASCULAR: No chest pain. No palpitations.  RESPIRATORY: Positive for shortness of breath. Positive for cough, whitish phlegm. No hemoptysis.  GASTROINTESTINAL: No nausea. No vomiting. No abdominal pain. No diarrhea. No constipation. No bright red blood per rectum. No melena.  GENITOURINARY: No burning on urination. No hematuria.  MUSCULOSKELETAL:  No joint pain or muscle pain.  INTEGUMENT: No rashes or eruptions.  NEUROLOGIC: No fainting or blackouts.  PSYCHIATRIC: Positive for depression.  No thoughts of suicidal ideation or homicidal ideation.  ENDOCRINE: No thyroid problems.  HEMATOLOGIC/LYMPHATIC:  No anemia.   PHYSICAL EXAMINATION: VITAL SIGNS:  Temperature 98, pulse 108, respirations 20, blood pressure 153/70, and pulse ox 88% on room air, dropped to 86% with ambulation.  GENERAL: No respiratory distress.  EYES: Conjunctivae and lids normal. Pupils equal, round and reactive to light. Extraocular muscles intact. No nystagmus.  EARS, NOSE, MOUTH, AND THROAT: Tympanic membranes no erythema. Nasal mucosa no erythema. Throat no erythema. No exudate seen. Lips and gums no lesions. Positive whitish exudate on tongue. NECK: No JVD. No bruits. No lymphadenopathy. No thyromegaly. No thyroid nodules palpated.  LUNGS:  Decreased breath sounds bilaterally. Poor inspiratory effort. Wheeze throughout entire lung field. No use of accessory muscles to breathe.  HEART:  S1 and S2 normal. No gallops, rubs or murmurs heard. Carotid upstroke 2+ bilaterally. No bruits. Dorsalis pedis pulses 2+ bilaterally. No edema of the lower extremities.  ABDOMEN: Soft, nontender. No organosplenomegaly. Normoactive bowel sounds. No masses felt.  LYMPHATIC: No lymph nodes in the neck.  MUSCULOSKELETAL: No clubbing, edema or cyanosis.  SKIN: No rashes or lesions.  NEUROLOGIC: Cranial nerves II through XII grossly intact. Deep tendon reflexes 2+ bilateral lower extremities.  PSYCHIATRIC: The patient is oriented to person, place and time.   LABORATORY AND DIAGNOSTICS:  Troponin negative. Glucose 114, BUN 6, creatinine 0.52, sodium 140, potassium 3.1, chloride 105, CO2 29  AND calcium 8.9. Liver function tests: AST slightly elevated at 43. White blood cell count 3.1, H and H 13.9 and 41.7 and platelet count of 242.   Chest x-ray:  Findings of COPD.  No acute  cardiopulmonary disease.   Venous pH shows a pH of 7.45.   EKG shows sinus tachycardia at 102 beats per minute.  No acute ST-T wave changes.   ASSESSMENT AND PLAN: 1.  Acute respiratory failure: Pulse ox 88% on room air, 86% with ambulation. We will give oxygen supplementation.  2.  Chronic obstructive pulmonary disease exacerbation.  We will give Solu-Medrol 125 mg IV x 1 and 40 mg IV q. 6 hours, DuoNeb nebulizer solution, continue Symbicort, add Spiriva.  3.  Tobacco abuse. Smoking cessation counseling done, 3 minutes by me. Continue smoking cessation. No need for a nicotine patch.  4.  Hypokalemia. Will replace potassium IV and orally.  5.  Depression.  The patient is interested in a medication for this.  Will start low-dose Celexa 10 mg daily.  6.  Possible thrush versus denture glue. We will give nystatin swish and swallow.  TIME SPENT ON ADMISSION: 50 minutes.  ____________________________ Herschell Dimesichard J. Renae GlossWieting, MD rjw:sb D: 02/04/2013 15:45:24 ET T: 02/04/2013 15:56:48 ET JOB#: [id number]  cc: Herschell Dimesichard J. Renae GlossWieting, MD, <Dictator> Serita ShellerErnest B. Maryellen PileEason, MD Salley ScarletICHARD J Ajai Harville MD ELECTRONICALLY SIGNED 02/04/2013 21:36

## 2015-01-24 NOTE — Discharge Summary (Signed)
PATIENT NAME:  Carla Rivera, KAPLER MR#:  [id number] DATE OF BIRTH:  Dec 19, 1955  DATE OF ADMISSION:  11/09/2013 DATE OF DISCHARGE:  11/12/2013  DISCHARGE DIAGNOSES:  1. Pneumonia and chronic obstructive pulmonary disease exacerbation, on home oxygen chronically at 1 to 1.5 liters per minute.  2.  Diarrhea and nausea, viral gastroenteritis.  3.  Dehydration. 4.  Malnutrition.   CONDITION ON DISCHARGE: Stable.   STATUS CODE:  FULL CODE.   MEDICATIONS ON DISCHARGE: 1.  Theophylline 200 mg oral tablet extended-release every 12 hours.  2.  Citalopram 10 mg oral tablet once a day.  3.  Spiriva 10 mg capsule, inhalation once a day.  4.  Prednisone 10 mg, start at 59 and taper x 10 mg daily until complete.  5.  Loperamide 2 mg oral capsule 3 times a day. 6.  Metoclopramide 5 mg oral 4 times a day as needed for nausea and vomiting.  7.  Symbicort 2 puffs inhalation 2 times a day. 9.  KCl 20 mEq extended-release for 7 days.  10.  Levaquin 750 mg every 24 hours for three days.  11.  Ensure 240 mg oral 2 times a day.   HOME HEALTH ON DISCHARGE: Yes.   HOME HEALTH SERVICES: Physical therapy and nurse aide.  HOME OXYGEN ON DISCHARGE:  Yes, oxygen delivery at home: 2 liters nasal cannula.   DIET ON DISCHARGE:  Regular.   DIET SUPPLEMENT: Ensure; she was advised to have 2 times a day.   DISCHARGE ACTIVITY:  Activity limitation as tolerated.   TIME FRAME TO FOLLOWUP:  Within 1 to 2 weeks with primary care physician, Dr. Tim Lair.   HISTORY OF PRESENT ILLNESS:  A 59 year old female with past medical history of  COPD and hypertension is presenting to ER with chief complaint of cough and shortness of breath for the past 2 days. The patient also has reported that she has been vomiting for the past 1 day and feeling dry and tired, and denies any fever or sick contacts. No further complaint. In ER, the patient had chest x-ray which showed right upper and middle lobe pneumonia. She was given IV  fluid and blood cultures were obtained. First dose of IV levofloxacin was given in ER.   HOSPITAL COURSE AND STAY:   1.  Right upper lobe and middle lobe pneumonia. Blood cultures were negative. She was on IV levofloxacin. She came back to her baseline after treatment with levofloxacin to 1 to 2 liters and so we discharged her home minutes, completing levofloxacin course at home.  2.  Hypokalemia.  Replaced IV and orally, discharge at home.  3. Mild malnutrition. The albumin was low, we started on Ensure and advised to continue at home.  4. Dehydration from vomiting. Gave her Zofran for symptomatic support. She also had some loose stool in the hospital, maybe it was viral gastroenteritis, but with IV fluids, she improved and we discharged with Imodium.  5.  Hypertension. Blood pressure was normal in the hospital, we held hydrochlorothiazide.  6. Chronic pulmonary disease exacerbation. We continued Duo-Neb and IV steroids. We changed to oral on discharge. Theophylline was continued.  Discharged home with home health aide.   LABORATORY DATA: Troponin was on admission less than 0.02. WBC was 23.8, hemoglobin 13 and platelet count was 238. Creatinine 0.91, sodium 131 and potassium 3.2. Blood cultures was negative. Chest x-ray, PA and lateral, on 7 February; right upper and middle lobe airspace disease, likely representing pneumonia. Potassium was 2.9 on admission.  Magnesium was 1.3, (Dictation Anomaly)<<MISSING TEXT was 8.4 on 10th of February, so we decided to discharge.   Total time spent on this discharge: 40 minutes.   ____________________________ Hope PigeonVaibhavkumar G. Elisabeth PigeonVachhani, MD vgv:NTS D: 11/16/2013 23:56:19 ET T: 11/17/2013 01:22:32 ET JOB#: [id number]  cc: Hope PigeonVaibhavkumar G. Elisabeth PigeonVachhani, MD, <Dictator> Altamese DillingVAIBHAVKUMAR Owynn Mosqueda MD ELECTRONICALLY SIGNED 11/23/2013 8:41

## 2015-01-24 NOTE — H&P (Signed)
PATIENT NAME:  Carla Rivera, Tyreka MR#:  [id number] DATE OF BIRTH:  [birth year]  DATE OF ADMISSION:  11/09/2013  PRIMARY CARE PHYSICIAN:  Dr. Toy CookeyErnest Eason.  REFERRING PHYSICIAN:  Dr. Shaune PollackLord.   CHIEF COMPLAINT:  Cough and shortness of breath.   HISTORY OF PRESENT ILLNESS:  The patient is a 59 year old African American female with past medical history of COPD and hypertension is presenting to the ER with a chief complaint of cough and shortness of breath for the past two days.  The patient also has reported that she has been vomiting for the past one day and feeling dry and tired.  Denies any fever or sick contacts.  No other complaints.  In the ER, the patient's chest x-ray has revealed right upper and middle lobe pneumonia.  She was given IV fluids and also blood cultures were obtained.  The patient was given first dose of IV levofloxacin for pneumonia.  The patient's potassium is low, regarding which IV fluids are started with potassium supplementation.  During my examination the patient is still feeling weak and tired, but denies any shortness of breath.  Her son's girlfriend is at bedside.  Denies any chest pain, dizziness or loss of consciousness.  No other complaints.   PAST MEDICAL HISTORY:  COPD and hypertension.   PAST SURGICAL HISTORY:  Ectopic pregnancy removal.   ALLERGIES:  NO KNOWN ALLERGIES.   PSYCHOSOCIAL HISTORY:  Lives at home.  She used to smoke and cut back to half pack a day.  Denies alcohol or illicit drug usage.   FAMILY HISTORY:  Mother died at age 60 from multiple issues.  She had end-stage renal disease on dialysis and hypertension.   HOME MEDICATIONS:  Theophylline 1 tablet by mouth every 12 hours, hydrochlorothiazide 25 mg once daily, citalopram 10 mg by mouth once daily.   REVIEW OF SYSTEMS:  CONSTITUTIONAL:  Denies any fever, but complaining of fatigue and weakness.  EYES:  Denies blurry vision, double vision.  EARS, NOSE, THROAT:  Denies epistaxis, discharge.   RESPIRATION:  Complaining of cough and shortness of breath, has history of COPD.  CARDIOVASCULAR:  No chest pain, palpitations.  GASTROINTESTINAL:  Complaining of nausea and vomiting.  Denies diarrhea or abdominal pain.  No hematemesis.  GENITOURINARY:  No dysuria or hematuria.  GYNECOLOGIC AND BREAST:  Denies breast mass or vaginal discharge. ENDOCRINE:  Denies polyuria, nocturia, thyroid problems.  HEMATOLOGIC AND LYMPHATIC:  No anemia, easy bruising, bleeding.  INTEGUMENTARY:  No acne, rash, lesions.  MUSCULOSKELETAL:  No joint pain in the neck and back.  Denies gout.  NEUROLOGIC:  No vertigo or ataxia.  PSYCHIATRIC:  No ADD, OCD.   PHYSICAL EXAMINATION: VITAL SIGNS:  Temperature 99.4, pulse 112, respirations 22, blood pressure 113/56, pulse ox 93%.  GENERAL APPEARANCE:  Not under acute distress, dry-looking thin African American female in no acute distress.  HEENT:  Normocephalic, atraumatic.  Pupils are equally reacting to light and accommodation.  No scleral icterus.  No conjunctival injection.  No sinus tenderness.  No postnasal drip.  Dry mucous membranes.  NECK:  Supple.  No JVD.  No thyromegaly.  Range of motion is intact.  LUNGS:  Positive crackles on the right side of the lung, moderate air entry.  No wheezing.  CARDIAC:  S1, S2 normal.  Regular rate and rhythm.  No murmurs.  GASTROINTESTINAL:  Soft.  Bowel sounds are positive in all four quadrants.  Nontender, nondistended.  No hepatosplenomegaly.  No masses felt.  NEUROLOGIC:  Awake, alert,  oriented x 3.  Motor and sensory grossly intact.  Reflexes are 2+.  Cranial nerves II through XII are intact.  EXTREMITIES:  No edema.  No cyanosis.  No clubbing.  SKIN:  Warm to touch.  Dry in nature.  No rashes.  No lesions.  MUSCULOSKELETAL:  No joint effusion, tenderness, erythema. PSYCHIATRIC:  Normal mood and affect.   LABORATORY AND IMAGING STUDIES:  LFTs are normal except albumin at 3.0, bili total is at 1.1.  Troponin less than  0.02.  WBC 23.8, hemoglobin 13.0, hematocrit 39.6, platelets 238.  Chem-8, sodium 131, potassium 3.2, chloride 97, CO2 25.  The rest of the Chem-8 is normal.  A 12-lead EKG has revealed sinus tachycardia at 120 beats per minute.  Normal PR and QRS interval.  No acute ST-T wave changes.  Chest x-ray, PA and lateral views:  Right upper and middle lobe air space disease likely representing pneumonia, positive COPD and emphysema.   ASSESSMENT AND PLAN:  A 59 year old African American female who came to the ER with a chief complaint of shortness of breath, productive cough for two days and vomiting for one day will be admitted with the following assessment and plan.  1.  Right upper lobe and middle lobe pneumonia.  Blood cultures and sputum cultures were ordered.  The patient will be on IV levofloxacin.  2.  Hypokalemia.  We will provide her IV fluids with potassium supplement.  3.  Mild malnutrition.  Albumin is at 3.0.  We will check prealbumin. 4.  Dehydration from vomiting.  We will provide her Zofran and IV fluids.  5.  Hypertension.  Blood pressure is on the low-normal side.  We will hold off on the hydrochlorothiazide as the patient is dehydrated and also hypokalemic.  6.  Chronic obstructive pulmonary disease, not in exacerbation.  We will provide her DuoNeb nebulizer treatments q. 6 hours as needed.   Diagnosis and plan of care was discussed in detail with the patient and her son's girlfriend at bedside.    We will provide her gastrointestinal and deep vein thrombosis prophylaxis.   Total time spent on admission is 45 minutes.    ____________________________ Ramonita Lab, MD ag:ea D: 11/10/2013 01:17:00 ET T: 11/10/2013 03:11:06 ET JOB#: [id number]  cc: Alden Server B. Maryellen Pile, MD Ramonita Lab, MD, <Dictator>  Ramonita Lab MD ELECTRONICALLY SIGNED 11/22/2013 1:06

## 2016-12-11 ENCOUNTER — Emergency Department: Payer: Self-pay

## 2016-12-11 ENCOUNTER — Encounter: Payer: Self-pay | Admitting: Emergency Medicine

## 2016-12-11 ENCOUNTER — Inpatient Hospital Stay
Admission: EM | Admit: 2016-12-11 | Discharge: 2016-12-12 | DRG: 192 | Disposition: A | Discharge status: Home / Self Care | Payer: Self-pay | Attending: Internal Medicine | Admitting: Internal Medicine

## 2016-12-11 DIAGNOSIS — Z7951 Long term (current) use of inhaled steroids: Secondary | ICD-10-CM

## 2016-12-11 DIAGNOSIS — F329 Major depressive disorder, single episode, unspecified: Secondary | ICD-10-CM | POA: Diagnosis present

## 2016-12-11 DIAGNOSIS — J441 Chronic obstructive pulmonary disease with (acute) exacerbation: Principal | ICD-10-CM | POA: Diagnosis present

## 2016-12-11 DIAGNOSIS — F32A Depression, unspecified: Secondary | ICD-10-CM | POA: Diagnosis present

## 2016-12-11 DIAGNOSIS — Z8249 Family history of ischemic heart disease and other diseases of the circulatory system: Secondary | ICD-10-CM

## 2016-12-11 DIAGNOSIS — I10 Essential (primary) hypertension: Secondary | ICD-10-CM | POA: Diagnosis present

## 2016-12-11 DIAGNOSIS — Z79899 Other long term (current) drug therapy: Secondary | ICD-10-CM

## 2016-12-11 DIAGNOSIS — Z880 Allergy status to penicillin: Secondary | ICD-10-CM

## 2016-12-11 DIAGNOSIS — R0602 Shortness of breath: Secondary | ICD-10-CM

## 2016-12-11 DIAGNOSIS — Z87891 Personal history of nicotine dependence: Secondary | ICD-10-CM

## 2016-12-11 HISTORY — DX: Chronic obstructive pulmonary disease, unspecified: J44.9

## 2016-12-11 HISTORY — DX: Major depressive disorder, single episode, unspecified: F32.9

## 2016-12-11 HISTORY — DX: Depression, unspecified: F32.A

## 2016-12-11 HISTORY — DX: Essential (primary) hypertension: I10

## 2016-12-11 LAB — CBC
HCT: 38.8 % (ref 35.0–47.0)
Hemoglobin: 13.5 g/dL (ref 12.0–16.0)
MCH: 29.9 pg (ref 26.0–34.0)
MCHC: 34.8 g/dL (ref 32.0–36.0)
MCV: 85.9 fL (ref 80.0–100.0)
PLATELETS: 241 10*3/uL (ref 150–440)
RBC: 4.52 MIL/uL (ref 3.80–5.20)
RDW: 13.3 % (ref 11.5–14.5)
WBC: 3.8 10*3/uL (ref 3.6–11.0)

## 2016-12-11 LAB — BASIC METABOLIC PANEL
Anion gap: 6 (ref 5–15)
BUN: 13 mg/dL (ref 6–20)
CO2: 31 mmol/L (ref 22–32)
CREATININE: 0.59 mg/dL (ref 0.44–1.00)
Calcium: 9 mg/dL (ref 8.9–10.3)
Chloride: 103 mmol/L (ref 101–111)
GFR calc Af Amer: >60 mL/min (ref >60)
GFR calc non Af Amer: >60 mL/min (ref >60)
GLUCOSE: 120 mg/dL — AB (ref 65–99)
Potassium: 3.1 mmol/L — ABNORMAL LOW (ref 3.5–5.1)
Sodium: 140 mmol/L (ref 135–145)

## 2016-12-11 LAB — TROPONIN I

## 2016-12-11 LAB — BRAIN NATRIURETIC PEPTIDE: B Natriuretic Peptide: 19 pg/mL (ref 0.0–100.0)

## 2016-12-11 LAB — FIBRIN DERIVATIVES D-DIMER (ARMC ONLY): Fibrin derivatives D-dimer (ARMC): 579.71 — ABNORMAL HIGH (ref 0.00–499.00)

## 2016-12-11 MED ORDER — IPRATROPIUM-ALBUTEROL 0.5-2.5 (3) MG/3ML IN SOLN
3.0000 mL | Freq: Once | RESPIRATORY_TRACT | Status: AC
  Administered 2016-12-11: 3 mL via RESPIRATORY_TRACT
  Filled 2016-12-11: qty 3

## 2016-12-11 MED ORDER — PREDNISONE 20 MG PO TABS
60.0000 mg | ORAL_TABLET | Freq: Once | ORAL | Status: AC
  Administered 2016-12-11: 60 mg via ORAL
  Filled 2016-12-11: qty 3

## 2016-12-11 MED ORDER — IOPAMIDOL (ISOVUE-370) INJECTION 76%
75.0000 mL | Freq: Once | INTRAVENOUS | Status: AC | PRN
  Administered 2016-12-11: 75 mL via INTRAVENOUS

## 2016-12-11 MED ORDER — POTASSIUM CHLORIDE CRYS ER 20 MEQ PO TBCR
20.0000 meq | EXTENDED_RELEASE_TABLET | Freq: Once | ORAL | Status: AC
  Administered 2016-12-11: 20 meq via ORAL
  Filled 2016-12-11: qty 1

## 2016-12-11 MED ORDER — ALBUTEROL SULFATE (2.5 MG/3ML) 0.083% IN NEBU: 5.0000 mg | INHALATION_SOLUTION | Freq: Once | RESPIRATORY_TRACT | Status: DC

## 2016-12-11 NOTE — H&P (Signed)
[facility] [place] at [facility]   PATIENT NAME: Carla Rivera    MR#:  [id number]  DATE OF BIRTH:  1956-07-15  DATE OF ADMISSION:  12/11/2016  PRIMARY CARE PHYSICIAN: Derwood Kaplan, MD   REQUESTING/REFERRING PHYSICIAN: Huel Cote, MD  CHIEF COMPLAINT:   Chief Complaint  Patient presents with  . Shortness of Breath    HISTORY OF PRESENT ILLNESS:  Carla Rivera  is a 61 y.o. female who presents with Aggressive shortness of breath over the last 2-3 days. Patient states this is primarily worse when she is up and about, but that it has been getting worse. She has a history of COPD and in the ED her workup was largely within normal limits in terms of infection or cardiac pathology. She is felt to have COPD exacerbation and hospitalists were called for admission.  PAST MEDICAL HISTORY:   Past Medical History:  Diagnosis Date  . COPD (chronic obstructive pulmonary disease) (HCC)   . Depression   . HTN (hypertension)     PAST SURGICAL HISTORY:   Past Surgical History:  Procedure Laterality Date  . ABDOMINAL HYSTERECTOMY    . ECTOPIC PREGNANCY SURGERY      SOCIAL HISTORY:   Social History  Substance Use Topics  . Smoking status: Former Games developer  . Smokeless tobacco: Never Used  . Alcohol use No    FAMILY HISTORY:   Family History  Problem Relation Age of Onset  . Renal Disease Mother   . Hypertension Mother     DRUG ALLERGIES:   Allergies  Allergen Reactions  . Penicillins Rash and Other (See Comments)    Has patient had a PCN reaction causing immediate rash, facial/tongue/throat swelling, SOB or lightheadedness with hypotension: No Has patient had a PCN reaction causing severe rash involving mucus membranes or skin necrosis: No Has patient had a PCN reaction that required hospitalization No Has patient had a PCN reaction occurring within the last 10 years: No If all of the above answers are "NO", then may proceed with  Cephalosporin use.     MEDICATIONS AT HOME:   Prior to Admission medications   Medication Sig Start Date End Date Taking? Authorizing Provider  citalopram (CELEXA) 10 MG tablet Take 10 mg by mouth daily.  11/26/16  Yes Historical Provider, MD    REVIEW OF SYSTEMS:  Review of Systems  Constitutional: Negative for chills, fever, malaise/fatigue and weight loss.  HENT: Negative for ear pain, hearing loss and tinnitus.   Eyes: Negative for blurred vision, double vision, pain and redness.  Respiratory: Positive for shortness of breath and wheezing. Negative for cough and hemoptysis.   Cardiovascular: Negative for chest pain, palpitations, orthopnea and leg swelling.  Gastrointestinal: Negative for abdominal pain, constipation, diarrhea, nausea and vomiting.  Genitourinary: Negative for dysuria, frequency and hematuria.  Musculoskeletal: Negative for back pain, joint pain and neck pain.  Skin:       No acne, rash, or lesions  Neurological: Negative for dizziness, tremors, focal weakness and weakness.  Endo/Heme/Allergies: Negative for polydipsia. Does not bruise/bleed easily.  Psychiatric/Behavioral: Negative for depression. The patient is not nervous/anxious and does not have insomnia.      VITAL SIGNS:   Vitals:   12/11/16 2030 12/11/16 2100 12/11/16 2200 12/11/16 2226  BP: (!) 141/80 (!) 147/68 (!) 149/81   Pulse: 66 68 78 (!) 130  Resp: 16 (!) 23  (!) 40  Temp:      TempSrc:  SpO2: 100% 96% 98% (!) 85%  Weight:      Height:       Wt Readings from Last 3 Encounters:  12/11/16 63.5 kg (140 lb)    PHYSICAL EXAMINATION:  Physical Exam  Vitals reviewed. Constitutional: She is oriented to person, place, and time. She appears well-developed and well-nourished. No distress.  HENT:  Head: Normocephalic and atraumatic.  Mouth/Throat: Oropharynx is clear and moist.  Eyes: Conjunctivae and EOM are normal. Pupils are equal, round, and reactive to light. No scleral icterus.   Neck: Normal range of motion. Neck supple. No JVD present. No thyromegaly present.  Cardiovascular: Normal rate, regular rhythm and intact distal pulses.  Exam reveals no gallop and no friction rub.   No murmur heard. Respiratory: Effort normal. No respiratory distress. She has wheezes. She has no rales.  GI: Soft. Bowel sounds are normal. She exhibits no distension. There is no tenderness.  Musculoskeletal: Normal range of motion. She exhibits no edema.  No arthritis, no gout  Lymphadenopathy:    She has no cervical adenopathy.  Neurological: She is alert and oriented to person, place, and time. No cranial nerve deficit.  No dysarthria, no aphasia  Skin: Skin is warm and dry. No rash noted. No erythema.  Psychiatric: She has a normal mood and affect. Her behavior is normal. Judgment and thought content normal.    LABORATORY PANEL:   CBC  Recent Labs Lab 12/11/16 1911  WBC 3.8  HGB 13.5  HCT 38.8  PLT 241   ------------------------------------------------------------------------------------------------------------------  Chemistries   Recent Labs Lab 12/11/16 1911  NA 140  K 3.1*  CL 103  CO2 31  GLUCOSE 120*  BUN 13  CREATININE 0.59  CALCIUM 9.0   ------------------------------------------------------------------------------------------------------------------  Cardiac Enzymes  Recent Labs Lab 12/11/16 1911  TROPONINI <0.03   ------------------------------------------------------------------------------------------------------------------  RADIOLOGY:  Dg Chest 2 View  Result Date: 12/11/2016 CLINICAL DATA:  COPD, shortness of breath, chest pain EXAM: CHEST  2 VIEW COMPARISON:  11/09/2013 FINDINGS: Scarring in the right upper lobe. No focal consolidation. No pleural effusion or pneumothorax. The heart is normal in size. Visualized osseous structures are within normal limits. IMPRESSION: No evidence of acute cardiopulmonary disease. Electronically Signed    By: Charline Bills M.D.   On: 12/11/2016 19:38   Ct Angio Chest Pe W Or Wo Contrast  Result Date: 12/11/2016 CLINICAL DATA:  Increased shortness of breath x3 days EXAM: CT ANGIOGRAPHY CHEST WITH CONTRAST TECHNIQUE: Multidetector CT imaging of the chest was performed using the standard protocol during bolus administration of intravenous contrast. Multiplanar CT image reconstructions and MIPs were obtained to evaluate the vascular anatomy. CONTRAST:  75 mL Isovue 370 IV COMPARISON:  Chest radiographs dated 12/11/2016. CTA chest dated 02/09/2013. FINDINGS: Cardiovascular: Satisfactory evaluation of the bilateral pulmonary arteries to the lobar level. No evidence of pulmonary embolism. No evidence of thoracic aortic aneurysm or dissection. The heart is normal in size.  No pericardial effusion. Mediastinum/Nodes: No suspicious mediastinal lymphadenopathy. Visualized right thyroid is mildly enlarged/ heterogeneous. Lungs/Pleura: Mild linear scarring in the right upper lobe. Moderate to severe centrilobular emphysematous changes, upper lobe predominant. No suspicious pulmonary nodules. No focal consolidation. No pleural effusion or pneumothorax. Upper Abdomen: Visualized upper abdomen is unremarkable. Musculoskeletal: Visualized osseous structures are within normal limits. Review of the MIP images confirms the above findings. IMPRESSION: No evidence of pulmonary embolism. Moderate to severe centrilobular emphysema. No evidence of acute cardiopulmonary disease. Electronically Signed   By: Roselie Awkward.D.  On: 12/11/2016 21:44    EKG:   Orders placed or performed during the hospital encounter of 12/11/16  . EKG 12-Lead  . EKG 12-Lead  . ED EKG  . ED EKG    IMPRESSION AND PLAN:  Principal Problem:   COPD exacerbation (HCC) - we will treat her with steroids, when necessary duo nebs, azithromycin, when necessary antitussive Active Problems:   HTN (hypertension) - patient not on medication at home  for this, we will treat when necessary here   Depression - continue home dose antidepressant  All the records are reviewed and case discussed with ED provider. Management plans discussed with the patient and/or family.  DVT PROPHYLAXIS: SubQ lovenox  GI PROPHYLAXIS: None  ADMISSION STATUS: Inpatient  CODE STATUS: Full Code Status History    This patient does not have a recorded code status. Please follow your organizational policy for patients in this situation.      TOTAL TIME TAKING CARE OF THIS PATIENT: 45 minutes.    Tosh Glaze FIELDING 12/11/2016, 10:53 PM  [facility]  Office  [phone]  CC: Primary care physician; Derwood KaplanEason,  Ernest B, MD

## 2016-12-11 NOTE — ED Triage Notes (Signed)
Pt to triage via wheelchair. Pt reports hx of copd and has been having increasing shortness of breath for the last 3 days. Pt denies cough or congestion. Pt does report chest pain earlier this morning but none at this time. Pt talking in full and complete sentences at this time.

## 2016-12-11 NOTE — ED Notes (Signed)
Pt taken to ct 

## 2016-12-11 NOTE — ED Notes (Signed)
Pt reports that she started having some sob late Thursday into Friday morning, pt states that she has felt this way before, pt has small amount of fluid in her legs bilat, pt reports that she does take a fluid pill, pt reports hx of copd. Pt is sitting upright for comfort with breathing. Pt reports that she is feeling better. Breath sounds are diminished throughout

## 2016-12-11 NOTE — ED Provider Notes (Signed)
Time Seen: Approximately 1918*  I have reviewed the triage notes  Chief Complaint: Shortness of Breath   History of Present Illness: Carla Rivera is a 61 y.o. female who has a history of COPD and is had previous home oxygen therapy. She states she was taken off the oxygen after she went back to work. She states that she has no nebulizer, etc. at home for treatment of her COPD. She's had some increased shortness of breath over the last 3 days especially with exertion. Some brief chest discomfort earlier today seemingly related to being short of breath. She denies any current chest pain, nausea, vomiting. She denies any previous cardiac history. She denies any leg pain or swelling or history of pulmonary emboli risk factors and states that she has stopped smoking.   Past Medical History:  Diagnosis Date  . COPD (chronic obstructive pulmonary disease) (HCC)     There are no active problems to display for this patient.   Past Surgical History:  Procedure Laterality Date  . ABDOMINAL HYSTERECTOMY    . ECTOPIC PREGNANCY SURGERY      Past Surgical History:  Procedure Laterality Date  . ABDOMINAL HYSTERECTOMY    . ECTOPIC PREGNANCY SURGERY        Allergies:  Penicillins  Family History: History reviewed. No pertinent family history.  Social History: Social History  Substance Use Topics  . Smoking status: Former Games developer  . Smokeless tobacco: Never Used  . Alcohol use No     Review of Systems:   10 point review of systems was performed and was otherwise negative:  Constitutional: NoObjective fever Eyes: No visual disturbances ENT: No sore throat, ear pain Cardiac: No chest pain Respiratory: Shortness of breath especially with exertion Abdomen: No abdominal pain, no vomiting, No diarrhea Endocrine: No weight loss, No night sweats Extremities: No peripheral edema, cyanosis Skin: No rashes, easy bruising Neurologic: No focal weakness, trouble with speech or  swollowing Urologic: No dysuria, Hematuria, or urinary frequency   Physical Exam:  ED Triage Vitals  Enc Vitals Group     BP 12/11/16 1904 (!) 171/71     Pulse Rate 12/11/16 1904 84     Resp 12/11/16 1904 18     Temp 12/11/16 1904 98.3 F (36.8 C)     Temp Source 12/11/16 1904 Oral     SpO2 12/11/16 1904 93 %     Weight 12/11/16 1905 140 lb (63.5 kg)     Height 12/11/16 1905 5' (1.524 m)     Head Circumference --      Peak Flow --      Pain Score 12/11/16 1905 0     Pain Loc --      Pain Edu? --      Excl. in GC? --     General: Awake , Alert , and Oriented times 3; GCS 15 No signs of respiratory distress and speaks in interrupted sentences. No audible wheezing at the bedside Head: Normal cephalic , atraumatic Eyes: Pupils equal , round, reactive to light Nose/Throat: No nasal drainage, patent upper airway without erythema or exudate.  Neck: Supple, Full range of motion, No anterior adenopathy or palpable thyroid masses Lungs: Mild and expiratory wheezing heard primarily at the apices without rales or rhonchi noted  Heart: Regular rate, regular rhythm without murmurs , gallops , or rubs Abdomen: Soft, non tender without rebound, guarding , or rigidity; bowel sounds positive and symmetric in all 4 quadrants. No organomegaly .  Extremities: 2 plus symmetric pulses. No edema, clubbing or cyanosis Neurologic: normal ambulation, Motor symmetric without deficits, sensory intact Skin: warm, dry, no rashes   Labs:   All laboratory work was reviewed including any pertinent negatives or positives listed below:  Labs Reviewed  BASIC METABOLIC PANEL - Abnormal; Notable for the following:       Result Value   Potassium 3.1 (*)    Glucose, Bld 120 (*)    All other components within normal limits  CBC  TROPONIN I  BRAIN NATRIURETIC PEPTIDE  FIBRIN DERIVATIVES D-DIMER ([facility] ONLY)    EKG: * ED ECG REPORT I, Jennye MoccasinBrian S Quigley, the attending physician, personally viewed and  interpreted this ECG.  Date: 12/11/2016 EKG Time:1907 Rate: *80 Rhythm: normal sinus rhythm QRS Axis: normal Intervals: normal ST/T Wave abnormalities: Nonspecific T wave abnormalities Narrative Interpretation: unremarkable No significant change from previous EKGs  Radiology:  ""Dg Chest 2 View  Result Date: 12/11/2016 CLINICAL DATA:  COPD, shortness of breath, chest pain EXAM: CHEST  2 VIEW COMPARISON:  11/09/2013 FINDINGS: Scarring in the right upper lobe. No focal consolidation. No pleural effusion or pneumothorax. The heart is normal in size. Visualized osseous structures are within normal limits. IMPRESSION: No evidence of acute cardiopulmonary disease. Electronically Signed   By: Charline BillsSriyesh  Krishnan M.D.   On: 12/11/2016 19:38   Ct Angio Chest Pe W Or Wo Contrast  Result Date: 12/11/2016 CLINICAL DATA:  Increased shortness of breath x3 days EXAM: CT ANGIOGRAPHY CHEST WITH CONTRAST TECHNIQUE: Multidetector CT imaging of the chest was performed using the standard protocol during bolus administration of intravenous contrast. Multiplanar CT image reconstructions and MIPs were obtained to evaluate the vascular anatomy. CONTRAST:  75 mL Isovue 370 IV COMPARISON:  Chest radiographs dated 12/11/2016. CTA chest dated 02/09/2013. FINDINGS: Cardiovascular: Satisfactory evaluation of the bilateral pulmonary arteries to the lobar level. No evidence of pulmonary embolism. No evidence of thoracic aortic aneurysm or dissection. The heart is normal in size.  No pericardial effusion. Mediastinum/Nodes: No suspicious mediastinal lymphadenopathy. Visualized right thyroid is mildly enlarged/ heterogeneous. Lungs/Pleura: Mild linear scarring in the right upper lobe. Moderate to severe centrilobular emphysematous changes, upper lobe predominant. No suspicious pulmonary nodules. No focal consolidation. No pleural effusion or pneumothorax. Upper Abdomen: Visualized upper abdomen is unremarkable. Musculoskeletal:  Visualized osseous structures are within normal limits. Review of the MIP images confirms the above findings. IMPRESSION: No evidence of pulmonary embolism. Moderate to severe centrilobular emphysema. No evidence of acute cardiopulmonary disease. Electronically Signed   By: Charline BillsSriyesh  Krishnan M.D.   On: 12/11/2016 21:44  " "  I personally reviewed the radiologic studies    ED Course: Patient received 2 DuoNeb labs without much in improvement. Her pulse oximetry on room air remains at 96% though with any form of ambulation she drops down to 80-85% on room air. Lung exam shows improvement. Her D-dimer test is borderline elevated and she underwent chest CT which did not show any signs of pulmonary embolism though shows more extensive emphysematous findings and just a plain x-ray.     Assessment: * Acute exacerbation of chronic obstructive pulmonary disease    Plan:  Inpatient            Jennye MoccasinBrian S Quigley, MD 12/11/16 2239

## 2016-12-11 NOTE — ED Notes (Signed)
Pt ambulated around nsg station, pt unable to walk all the way around due to sob and labored breathing, rate up to 40, sats down to 82% on RA, rate of 120, dr Huel CoteQuigley aware

## 2016-12-11 NOTE — ED Notes (Signed)
Pt back from ct

## 2016-12-11 NOTE — ED Notes (Signed)
Pt ambulated around nsg station, pt's sat down to 85% on RA, heart rate of 130, and resp of 40

## 2016-12-12 LAB — CBC
HCT: 37.6 % (ref 35.0–47.0)
HEMOGLOBIN: 12.7 g/dL (ref 12.0–16.0)
MCH: 29.4 pg (ref 26.0–34.0)
MCHC: 33.7 g/dL (ref 32.0–36.0)
MCV: 87.2 fL (ref 80.0–100.0)
Platelets: 247 10*3/uL (ref 150–440)
RBC: 4.31 MIL/uL (ref 3.80–5.20)
RDW: 13.8 % (ref 11.5–14.5)
WBC: 4.1 10*3/uL (ref 3.6–11.0)

## 2016-12-12 LAB — BASIC METABOLIC PANEL
ANION GAP: 9 (ref 5–15)
BUN: 13 mg/dL (ref 6–20)
CALCIUM: 8.8 mg/dL — AB (ref 8.9–10.3)
CHLORIDE: 106 mmol/L (ref 101–111)
CO2: 25 mmol/L (ref 22–32)
Creatinine, Ser: 0.7 mg/dL (ref 0.44–1.00)
GFR calc non Af Amer: >60 mL/min (ref >60)
Glucose, Bld: 234 mg/dL — ABNORMAL HIGH (ref 65–99)
Potassium: 3.5 mmol/L (ref 3.5–5.1)
Sodium: 140 mmol/L (ref 135–145)

## 2016-12-12 MED ORDER — ONDANSETRON HCL 4 MG/2ML IJ SOLN: 4.0000 mg | Freq: Four times a day (QID) | INTRAMUSCULAR | Status: DC | PRN

## 2016-12-12 MED ORDER — IPRATROPIUM-ALBUTEROL 0.5-2.5 (3) MG/3ML IN SOLN
3.0000 mL | RESPIRATORY_TRACT | Status: DC | PRN
  Administered 2016-12-12: 3 mL via RESPIRATORY_TRACT
  Filled 2016-12-12: qty 3

## 2016-12-12 MED ORDER — ACETAMINOPHEN 650 MG RE SUPP: 650.0000 mg | Freq: Four times a day (QID) | RECTAL | Status: DC | PRN

## 2016-12-12 MED ORDER — AZITHROMYCIN 500 MG PO TABS
500.0000 mg | ORAL_TABLET | Freq: Every day | ORAL | Status: DC
  Administered 2016-12-12 (×2): 500 mg via ORAL
  Filled 2016-12-12 (×2): qty 1

## 2016-12-12 MED ORDER — CITALOPRAM HYDROBROMIDE 20 MG PO TABS
10.0000 mg | ORAL_TABLET | Freq: Every day | ORAL | Status: DC
  Administered 2016-12-12: 10 mg via ORAL
  Filled 2016-12-12: qty 1

## 2016-12-12 MED ORDER — HYDRALAZINE HCL 20 MG/ML IJ SOLN: 10.0000 mg | INTRAMUSCULAR | Status: DC | PRN

## 2016-12-12 MED ORDER — ENOXAPARIN SODIUM 40 MG/0.4ML ~~LOC~~ SOLN: 40.0000 mg | SUBCUTANEOUS | Status: DC

## 2016-12-12 MED ORDER — ACETAMINOPHEN 325 MG PO TABS: 650.0000 mg | ORAL_TABLET | Freq: Four times a day (QID) | ORAL | Status: DC | PRN

## 2016-12-12 MED ORDER — GUAIFENESIN-DM 100-10 MG/5ML PO SYRP
5.0000 mL | ORAL_SOLUTION | ORAL | Status: DC | PRN
  Filled 2016-12-12: qty 5

## 2016-12-12 MED ORDER — IPRATROPIUM-ALBUTEROL 0.5-2.5 (3) MG/3ML IN SOLN: 3.0000 mL | RESPIRATORY_TRACT | 0 refills | Status: DC | PRN

## 2016-12-12 MED ORDER — PREDNISONE 20 MG PO TABS
40.0000 mg | ORAL_TABLET | Freq: Every day | ORAL | Status: DC
  Administered 2016-12-12: 40 mg via ORAL
  Filled 2016-12-12: qty 2

## 2016-12-12 MED ORDER — ONDANSETRON HCL 4 MG PO TABS: 4.0000 mg | ORAL_TABLET | Freq: Four times a day (QID) | ORAL | Status: DC | PRN

## 2016-12-12 MED ORDER — PREDNISONE 10 MG PO TABS: 50.0000 mg | ORAL_TABLET | Freq: Every day | ORAL | 0 refills | Status: DC

## 2016-12-12 NOTE — Discharge Summary (Addendum)
[facility] - [place] at [facility]   PATIENT NAME: Carla Rivera    MR#:  [id number]  DATE OF BIRTH:  08-20-1956  DATE OF ADMISSION:  12/11/2016 ADMITTING PHYSICIAN: Oralia Manis, MD  DATE OF DISCHARGE: 12/12/16  PRIMARY CARE PHYSICIAN: Derwood Kaplan, MD    ADMISSION DIAGNOSIS:  Shortness of breath [R06.02] COPD exacerbation (HCC) [J44.1]  DISCHARGE DIAGNOSIS:  COPD exacerbation acute on chronic  SECONDARY DIAGNOSIS:   Past Medical History:  Diagnosis Date  . COPD (chronic obstructive pulmonary disease) (HCC)   . Depression   . HTN (hypertension)     HOSPITAL COURSE:   Carla Rivera  is a 61 y.o. female who presents with Aggressive shortness of breath over the last 2-3 days. Patient states this is primarily worse when she is up and about, but that it has been getting worse. She has a history of COPD.  * COPD exacerbation (HCC) - we will treat her with steroids, when necessary duo nebs,  when necessary antitussive -no indications for abxs. Afebrile sats 100% on RA  * HTN (hypertension) - patient not on medication at home for this, we will treat when necessary here  * Depression - continue home dose antidepressant  *DVT prophylaxis lovenox  Stale for d/c  CONSULTS OBTAINED:    DRUG ALLERGIES:   Allergies  Allergen Reactions  . Penicillins Rash and Other (See Comments)    Has patient had a PCN reaction causing immediate rash, facial/tongue/throat swelling, SOB or lightheadedness with hypotension: No Has patient had a PCN reaction causing severe rash involving mucus membranes or skin necrosis: No Has patient had a PCN reaction that required hospitalization No Has patient had a PCN reaction occurring within the last 10 years: No If all of the above answers are "NO", then may proceed with Cephalosporin use.     DISCHARGE MEDICATIONS:   Current Discharge Medication List    START taking these medications   Details   ipratropium-albuterol (DUONEB) 0.5-2.5 (3) MG/3ML SOLN Take 3 mLs by nebulization every 4 (four) hours as needed. Qty: 360 mL, Refills: 0    predniSONE (DELTASONE) 10 MG tablet Take 5 tablets (50 mg total) by mouth daily with breakfast. Start 50 mg daily then taper 10 mg daily and stop Qty: 15 tablet, Refills: 0      CONTINUE these medications which have NOT CHANGED   Details  albuterol (PROVENTIL HFA;VENTOLIN HFA) 108 (90 Base) MCG/ACT inhaler Inhale 2 puffs into the lungs every 6 (six) hours as needed for wheezing or shortness of breath.    budesonide-formoterol (SYMBICORT) 160-4.5 MCG/ACT inhaler Inhale 2 puffs into the lungs 2 (two) times daily.    citalopram (CELEXA) 10 MG tablet Take 10 mg by mouth daily.  Refills: 3        If you experience worsening of your admission symptoms, develop shortness of breath, life threatening emergency, suicidal or homicidal thoughts you must seek medical attention immediately by calling 911 or calling your MD immediately  if symptoms less severe.  You Must read complete instructions/literature along with all the possible adverse reactions/side effects for all the Medicines you take and that have been prescribed to you. Take any new Medicines after you have completely understood and accept all the possible adverse reactions/side effects.   Please note  You were cared for by a hospitalist during your hospital stay. If you have any questions about your discharge medications or the care you received while you were in the hospital after  you are discharged, you can call the unit and asked to speak with the hospitalist on call if the hospitalist that took care of you is not available. Once you are discharged, your primary care physician will handle any further medical issues. Please note that NO REFILLS for any discharge medications will be authorized once you are discharged, as it is imperative that you return to your primary care physician (or establish a  relationship with a primary care physician if you do not have one) for your aftercare needs so that they can reassess your need for medications and monitor your lab values. Today   SUBJECTIVE   Doing well  VITAL SIGNS:  Blood pressure (!) 145/77, pulse (!) 103, temperature 97.7 F (36.5 C), temperature source Oral, resp. rate 18, height 5' (1.524 m), weight 63.5 kg (140 lb), SpO2 100 %.  I/O:  No intake or output data in the 24 hours ending 12/12/16 1136  PHYSICAL EXAMINATION:  GENERAL:  61 y.o.-year-old patient lying in the bed with no acute distress.  EYES: Pupils equal, round, reactive to light and accommodation. No scleral icterus. Extraocular muscles intact.  HEENT: Head atraumatic, normocephalic. Oropharynx and nasopharynx clear.  NECK:  Supple, no jugular venous distention. No thyroid enlargement, no tenderness.  LUNGS: Normal breath sounds bilaterally, no wheezing, rales,rhonchi or crepitation. No use of accessory muscles of respiration.  CARDIOVASCULAR: S1, S2 normal. No murmurs, rubs, or gallops.  ABDOMEN: Soft, non-tender, non-distended. Bowel sounds present. No organomegaly or mass.  EXTREMITIES: No pedal edema, cyanosis, or clubbing.  NEUROLOGIC: Cranial nerves II through XII are intact. Muscle strength 5/5 in all extremities. Sensation intact. Gait not checked.  PSYCHIATRIC: The patient is alert and oriented x 3.  SKIN: No obvious rash, lesion, or ulcer.   DATA REVIEW:   CBC   Recent Labs Lab 12/12/16 0316  WBC 4.1  HGB 12.7  HCT 37.6  PLT 247    Chemistries   Recent Labs Lab 12/12/16 0316  NA 140  K 3.5  CL 106  CO2 25  GLUCOSE 234*  BUN 13  CREATININE 0.70  CALCIUM 8.8*    Microbiology Results   No results found for this or any previous visit (from the past 240 hour(s)).  RADIOLOGY:  Dg Chest 2 View  Result Date: 12/11/2016 CLINICAL DATA:  COPD, shortness of breath, chest pain EXAM: CHEST  2 VIEW COMPARISON:  11/09/2013 FINDINGS: Scarring  in the right upper lobe. No focal consolidation. No pleural effusion or pneumothorax. The heart is normal in size. Visualized osseous structures are within normal limits. IMPRESSION: No evidence of acute cardiopulmonary disease. Electronically Signed   By: Charline BillsSriyesh  Krishnan M.D.   On: 12/11/2016 19:38   Ct Angio Chest Pe W Or Wo Contrast  Result Date: 12/11/2016 CLINICAL DATA:  Increased shortness of breath x3 days EXAM: CT ANGIOGRAPHY CHEST WITH CONTRAST TECHNIQUE: Multidetector CT imaging of the chest was performed using the standard protocol during bolus administration of intravenous contrast. Multiplanar CT image reconstructions and MIPs were obtained to evaluate the vascular anatomy. CONTRAST:  75 mL Isovue 370 IV COMPARISON:  Chest radiographs dated 12/11/2016. CTA chest dated 02/09/2013. FINDINGS: Cardiovascular: Satisfactory evaluation of the bilateral pulmonary arteries to the lobar level. No evidence of pulmonary embolism. No evidence of thoracic aortic aneurysm or dissection. The heart is normal in size.  No pericardial effusion. Mediastinum/Nodes: No suspicious mediastinal lymphadenopathy. Visualized right thyroid is mildly enlarged/ heterogeneous. Lungs/Pleura: Mild linear scarring in the right upper lobe. Moderate to severe  centrilobular emphysematous changes, upper lobe predominant. No suspicious pulmonary nodules. No focal consolidation. No pleural effusion or pneumothorax. Upper Abdomen: Visualized upper abdomen is unremarkable. Musculoskeletal: Visualized osseous structures are within normal limits. Review of the MIP images confirms the above findings. IMPRESSION: No evidence of pulmonary embolism. Moderate to severe centrilobular emphysema. No evidence of acute cardiopulmonary disease. Electronically Signed   By: Charline Bills M.D.   On: 12/11/2016 21:44     Management plans discussed with the patient, family and they are in agreement.  CODE STATUS:     Code Status Orders         Start     Ordered   12/12/16 0024  Full code  Continuous     12/12/16 0023    Code Status History    Date Active Date Inactive Code Status Order ID Comments User Context   This patient has a current code status but no historical code status.      TOTAL TIME TAKING CARE OF THIS PATIENT: 40 minutes.    Austin Pongratz M.D on 12/12/2016 at 11:36 AM  Between 7am to 6pm - Pager - [phone] After 6pm go to [email or web address] - Social research officer, government  [facility]  Office  [phone]  CC: Primary care physician; Derwood Kaplan, MD

## 2016-12-12 NOTE — Care Management (Signed)
Patient discharged prior to [facility] evaluation.

## 2016-12-12 NOTE — ED Notes (Signed)
Report to debra for 146

## 2016-12-12 NOTE — Progress Notes (Signed)
Inpatient Diabetes Program Recommendations  AACE/ADA: New Consensus Statement on Inpatient Glycemic Control (2015)  Target Ranges:  Prepandial:   less than 140 mg/dL      Peak postprandial:   less than 180 mg/dL (1-2 hours)      Critically ill patients:  140 - 180 mg/dL   Results for Carla Rivera, Carla Rivera (MRN [id number]) as of 12/12/2016 08:57  Ref. Range 12/11/2016 19:11 12/12/2016 03:16  Glucose Latest Ref Range: 65 - 99 mg/dL 409120 (H) 811234 (H)   Review of Glycemic Control  Diabetes history: No Outpatient Diabetes medications: NA Current orders for Inpatient glycemic control: None  Inpatient Diabetes Program Recommendations: Correction (SSI): Lab glucose 234 mg/dl at 9:143:16 am today. While inpatient and ordered steroids, please consider ordering CBGs with Novolog correction scale ACHS. HgbA1C: Please consider ordering an A1C to evaluate glycemic control over the past 2-3 months.  Thanks, Orlando PennerMarie Melannie Metzner, RN, MSN, CDE Diabetes Coordinator Inpatient Diabetes Program [phone] (Team Pager from 8am to 5pm)

## 2016-12-12 NOTE — Progress Notes (Signed)
Discharge Instructions reviewed with patient and patient's visitor with patient's permission. Patient verbalized understanding. Patient to pick up meds at pharmacy indicated on D/C paperwork. Patient to be transported home by family via private vehicle.

## 2017-06-27 IMAGING — CR DG CHEST 2V
3 series · 3 of 3 positions shown · non-contrast
Comparison: 11/09/2013

CLINICAL DATA: COPD, shortness of breath, chest pain

EXAM:
CHEST  2 VIEW

[chest pa]
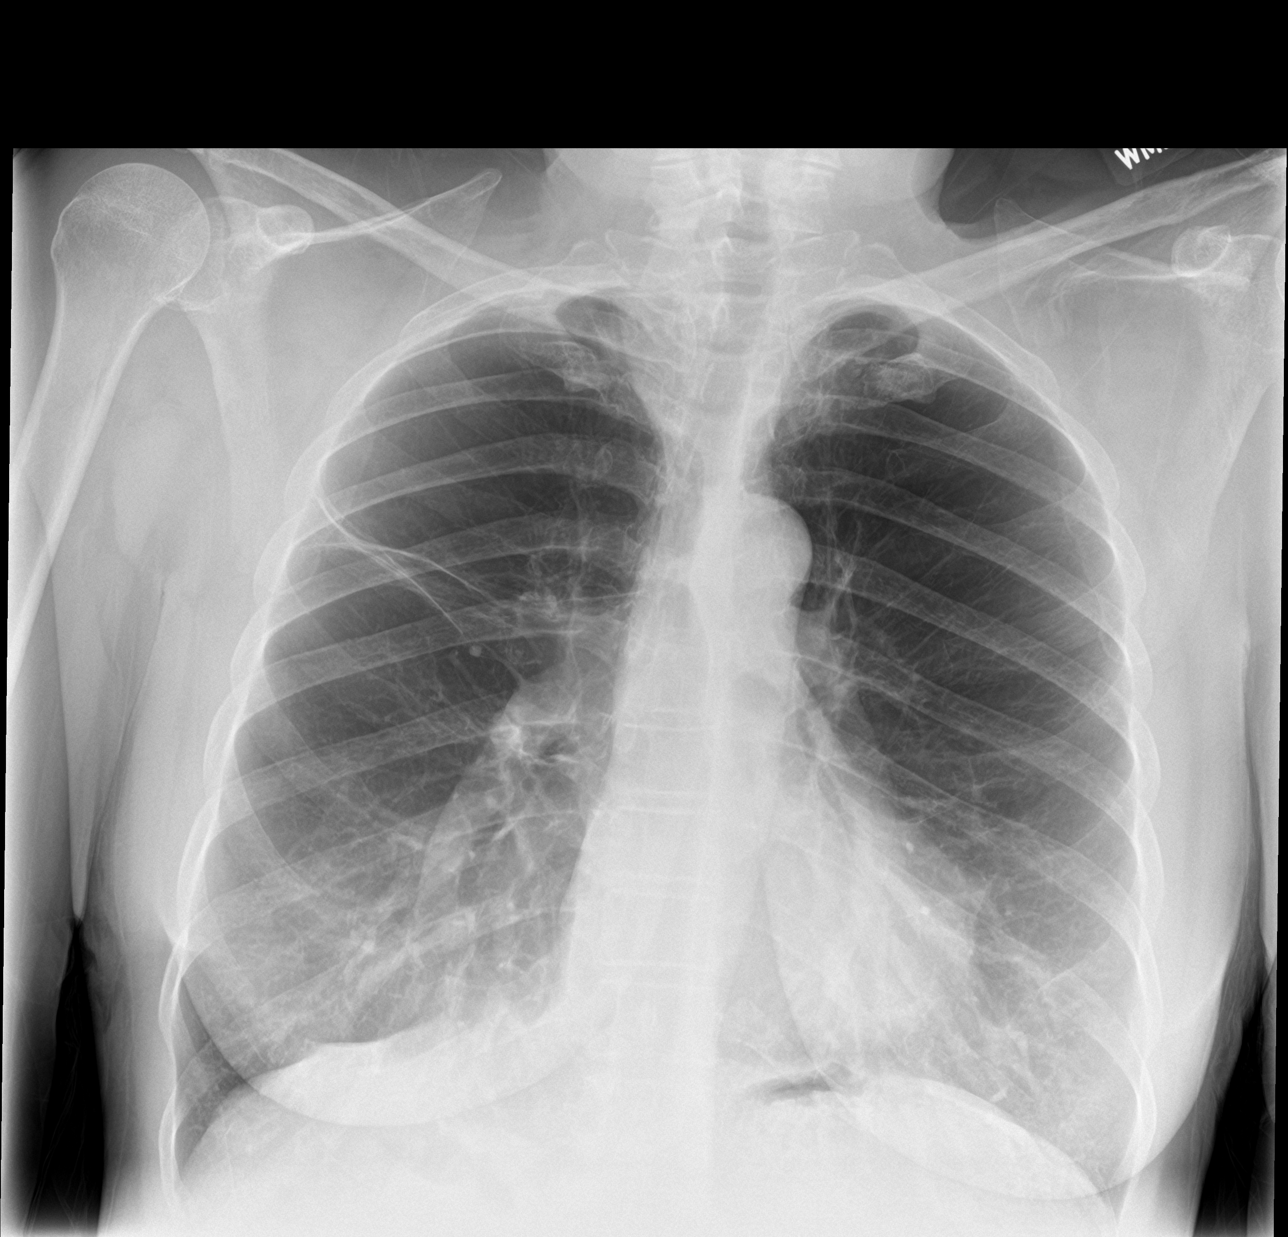

[chest lat (1 of 2)]
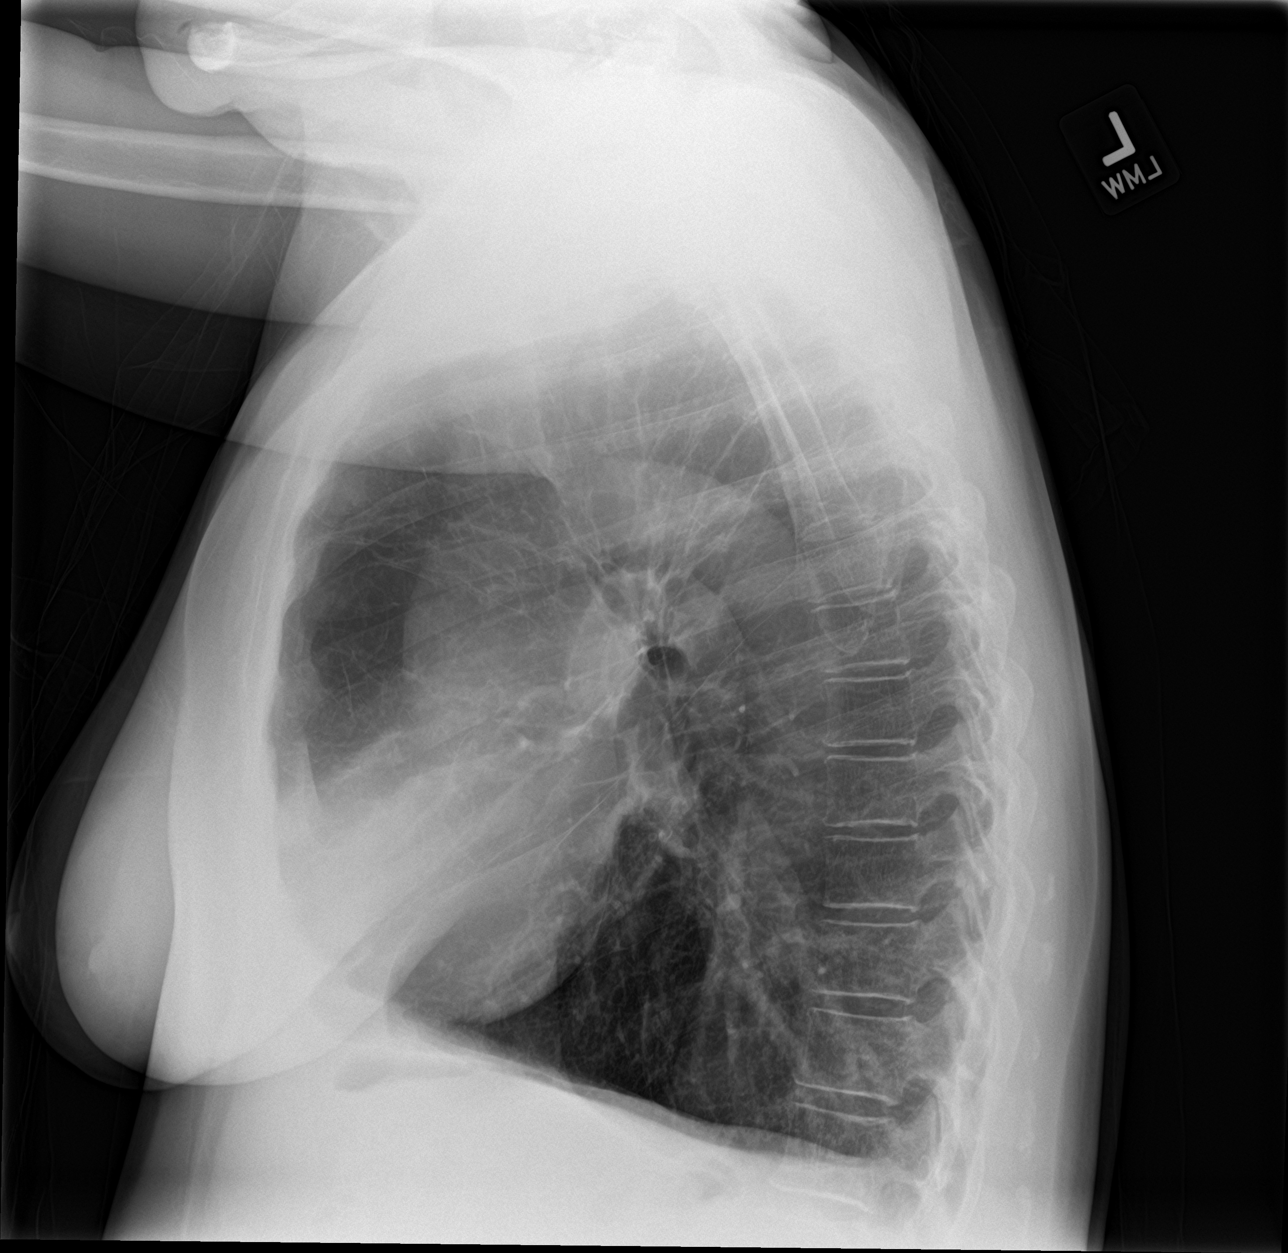

[chest lat (2 of 2)]
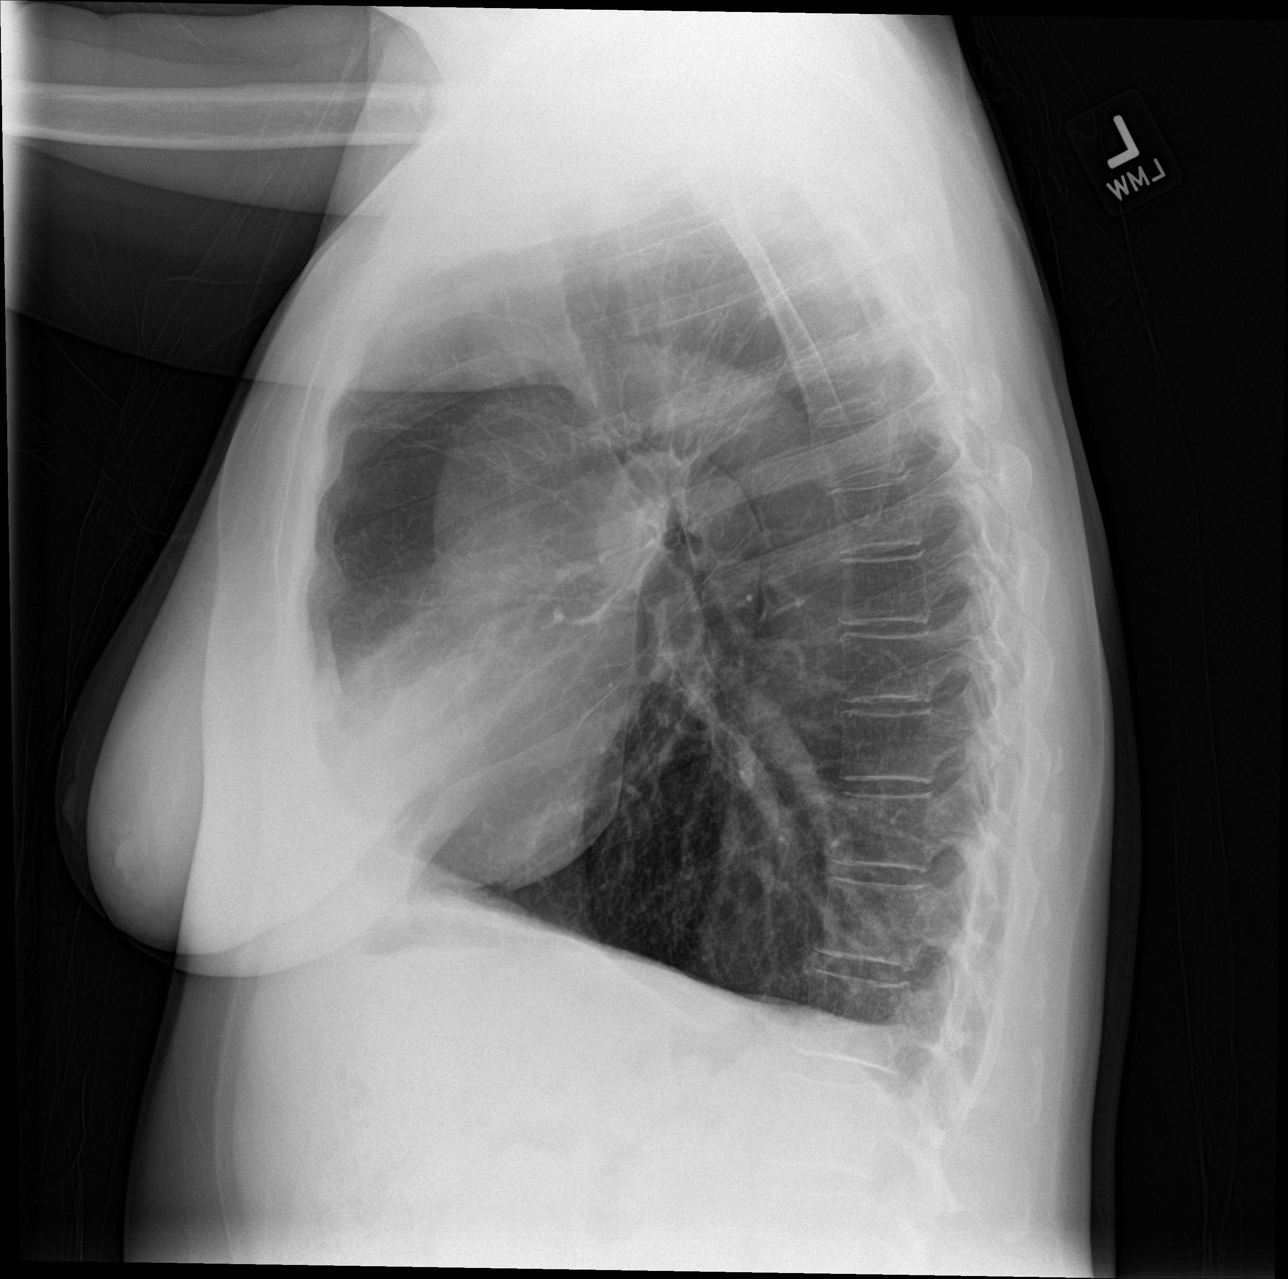

[3 of 3 positions shown; findings below may reference images not displayed]

FINDINGS: Scarring in the right upper lobe. No focal consolidation. No pleural
effusion or pneumothorax.

The heart is normal in size.

Visualized osseous structures are within normal limits.
IMPRESSION: No evidence of acute cardiopulmonary disease.

## 2017-06-27 IMAGING — CT CT ANGIO CHEST
2 of 6 series · 19 of 46 positions shown · IV contrast (APPLIED)
Comparison: Chest radiographs dated 12/11/2016. CTA chest dated
02/09/2013.

CLINICAL DATA: Increased shortness of breath x3 days

EXAM:
CT ANGIOGRAPHY CHEST WITH CONTRAST
TECHNIQUE: Multidetector CT imaging of the chest was performed using the
standard protocol during bolus administration of intravenous
contrast. Multiplanar CT image reconstructions and MIPs were
obtained to evaluate the vascular anatomy.
CONTRAST:  75 mL Isovue 370 IV

[Series 5: thins · axial · 0.65mm/px · z∈[-246,-10]mm · 17 of 260 slices shown]
[im 12/260  lung]
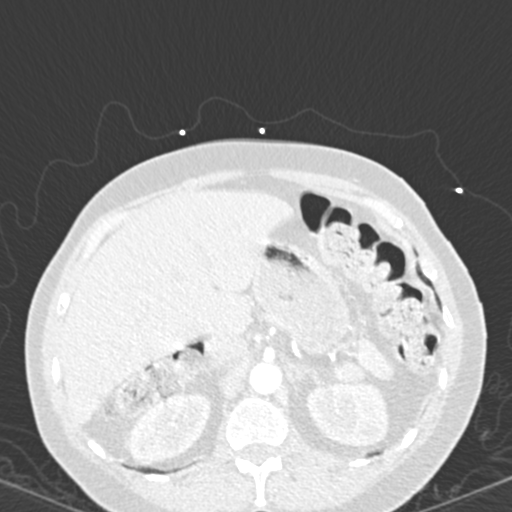
[im 23/260  soft-tissue]
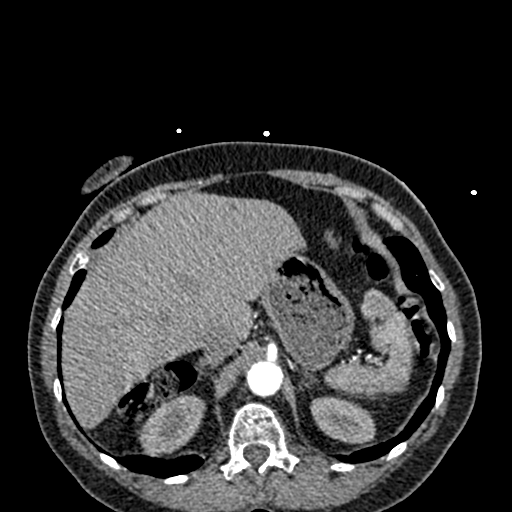
[im 46/260  lung]
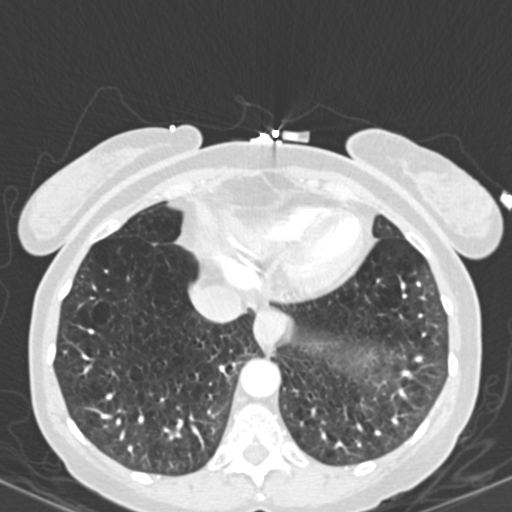
[im 57/260  soft-tissue]
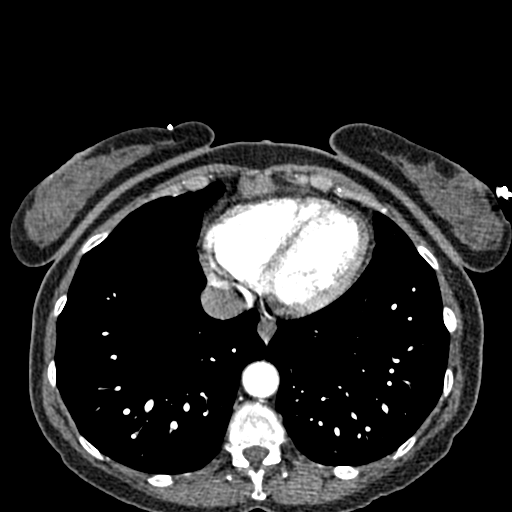
[im 68/260  lung]
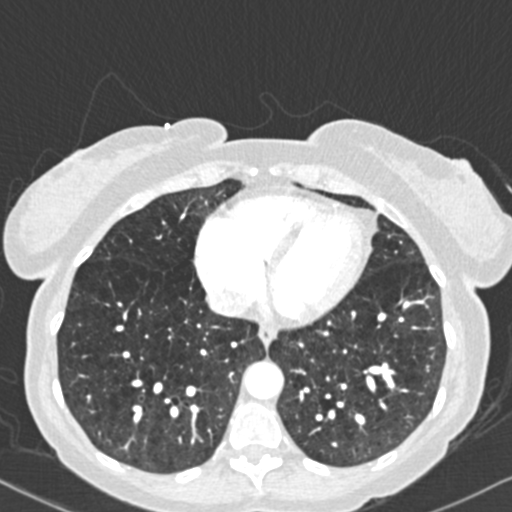
[im 91/260  soft-tissue]
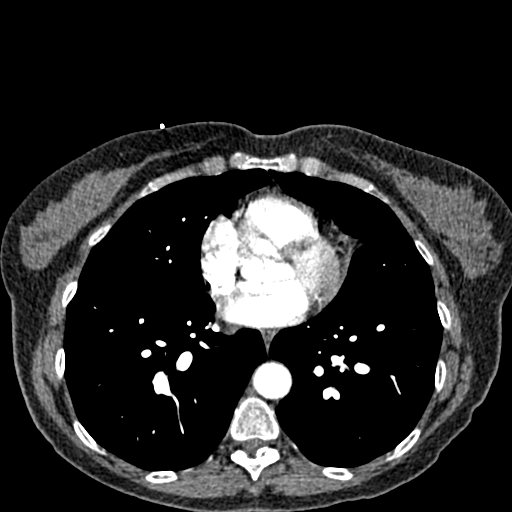
[im 102/260  lung]
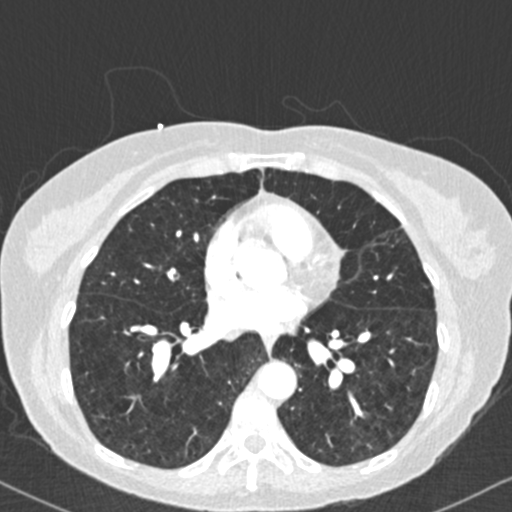
[im 113/260  soft-tissue]
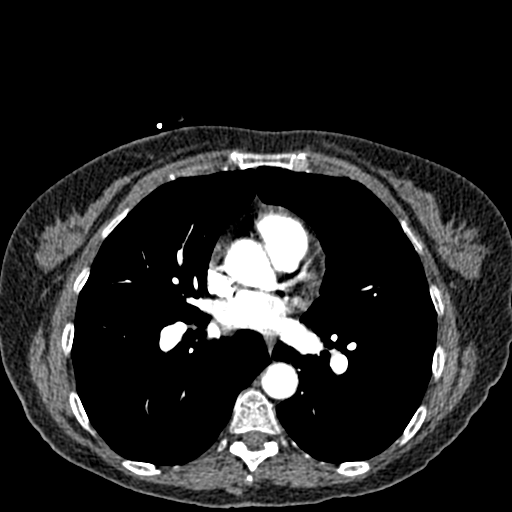
[im 136/260  lung]
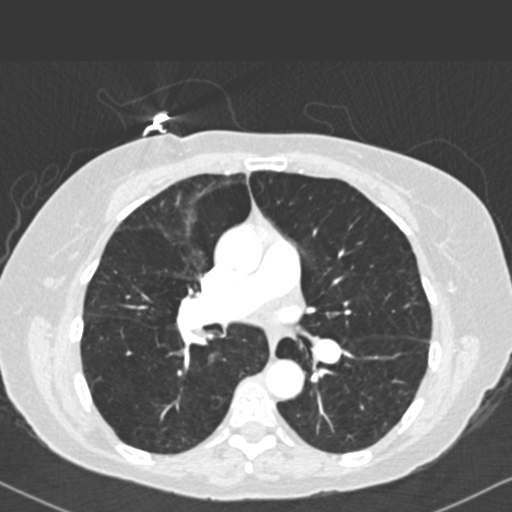
[im 147/260  soft-tissue]
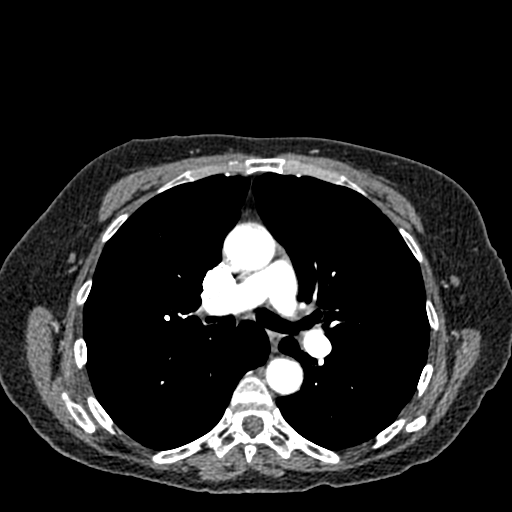
[im 158/260  lung]
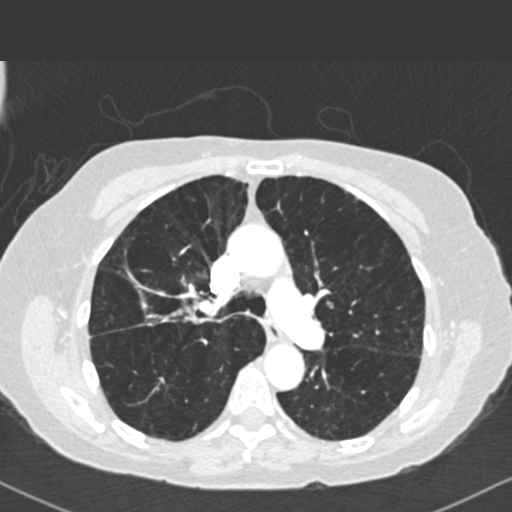
[im 169/260  soft-tissue]
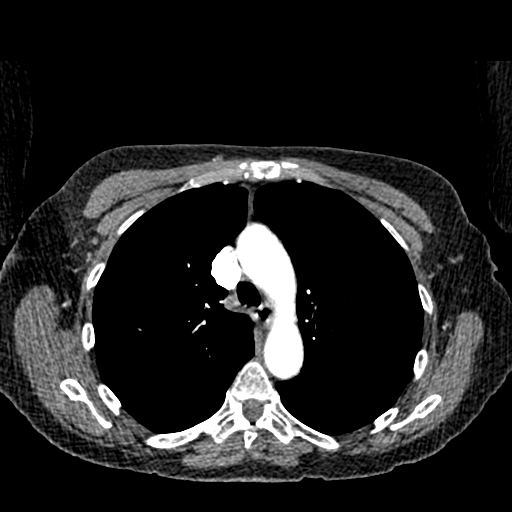
[im 192/260  lung]
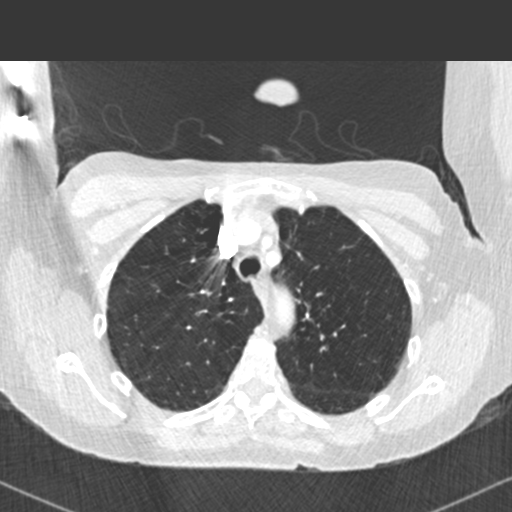
[im 203/260  soft-tissue]
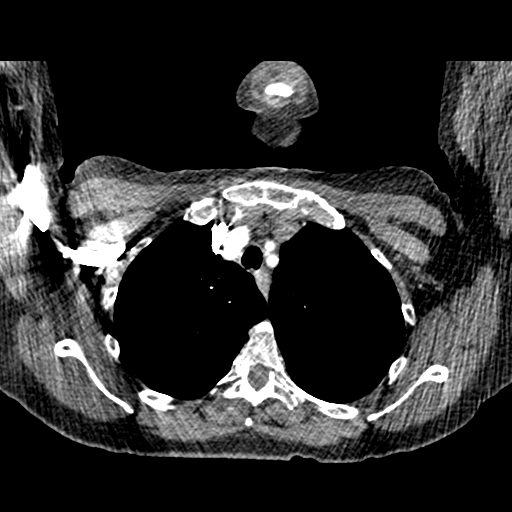
[im 214/260  lung]
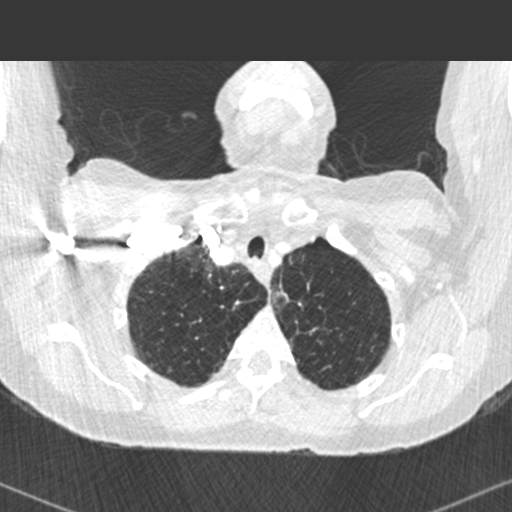
[im 237/260  soft-tissue]
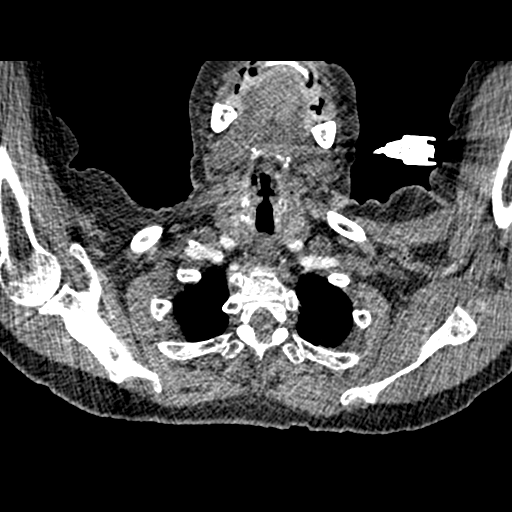
[im 248/260  lung]
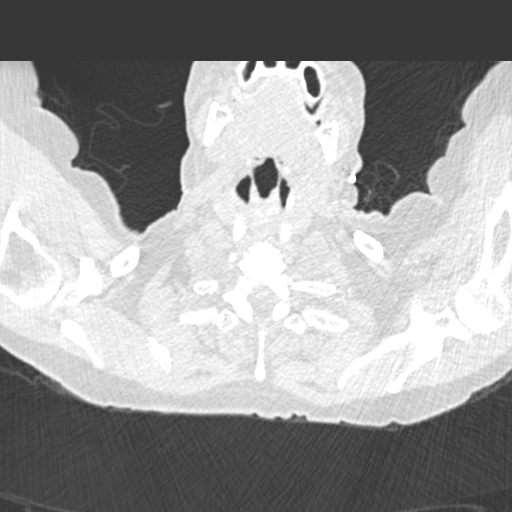

[Series 7: coronal mpr · coronal · 0.51mm/px · 2 of 81 slices shown]
[im 27/81  soft-tissue]
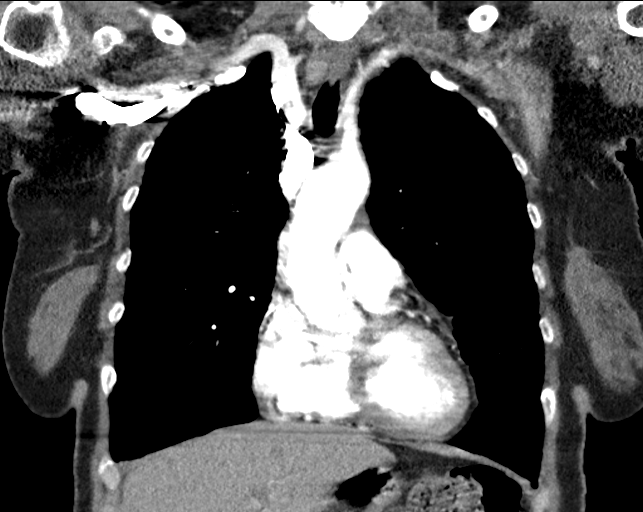
[im 54/81  soft-tissue]
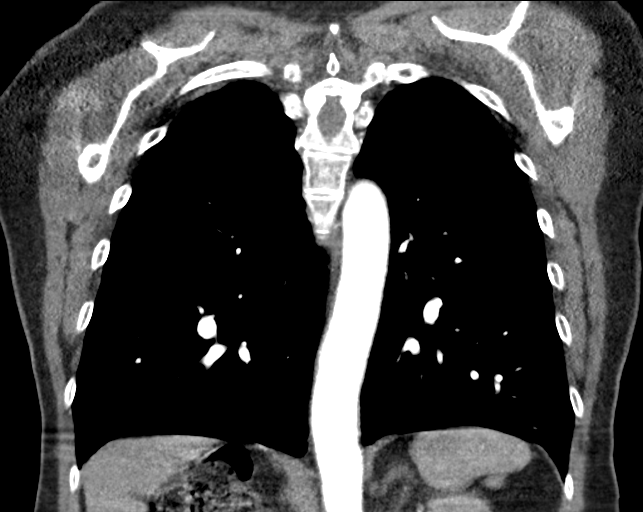

[19 of 46 positions shown; findings below may reference images not displayed]

FINDINGS: Cardiovascular: Satisfactory evaluation of the bilateral pulmonary
arteries to the lobar level. No evidence of pulmonary embolism.

No evidence of thoracic aortic aneurysm or dissection.

The heart is normal in size.  No pericardial effusion.

Mediastinum/Nodes: No suspicious mediastinal lymphadenopathy.

Visualized right thyroid is mildly enlarged/ heterogeneous.

Lungs/Pleura: Mild linear scarring in the right upper lobe.

Moderate to severe centrilobular emphysematous changes, upper lobe
predominant.

No suspicious pulmonary nodules.

No focal consolidation.

No pleural effusion or pneumothorax.

Upper Abdomen: Visualized upper abdomen is unremarkable.

Musculoskeletal: Visualized osseous structures are within normal
limits.

Review of the MIP images confirms the above findings.
IMPRESSION: No evidence of pulmonary embolism.

Moderate to severe centrilobular emphysema.

No evidence of acute cardiopulmonary disease.

## 2017-09-06 ENCOUNTER — Encounter: Payer: Self-pay | Admitting: Emergency Medicine

## 2017-09-06 ENCOUNTER — Emergency Department
Admission: EM | Admit: 2017-09-06 | Discharge: 2017-09-06 | Disposition: A | Discharge status: Home / Self Care | Payer: Self-pay | Attending: Emergency Medicine | Admitting: Emergency Medicine

## 2017-09-06 ENCOUNTER — Emergency Department: Payer: Self-pay

## 2017-09-06 DIAGNOSIS — J441 Chronic obstructive pulmonary disease with (acute) exacerbation: Secondary | ICD-10-CM | POA: Insufficient documentation

## 2017-09-06 DIAGNOSIS — I10 Essential (primary) hypertension: Secondary | ICD-10-CM | POA: Insufficient documentation

## 2017-09-06 DIAGNOSIS — Z87891 Personal history of nicotine dependence: Secondary | ICD-10-CM | POA: Insufficient documentation

## 2017-09-06 LAB — CBC
HCT: 45.7 % (ref 35.0–47.0)
HEMOGLOBIN: 14.9 g/dL (ref 12.0–16.0)
MCH: 28.2 pg (ref 26.0–34.0)
MCHC: 32.6 g/dL (ref 32.0–36.0)
MCV: 86.5 fL (ref 80.0–100.0)
Platelets: 274 10*3/uL (ref 150–440)
RBC: 5.28 MIL/uL — ABNORMAL HIGH (ref 3.80–5.20)
RDW: 13.9 % (ref 11.5–14.5)
WBC: 3.5 10*3/uL — AB (ref 3.6–11.0)

## 2017-09-06 LAB — BASIC METABOLIC PANEL
ANION GAP: 10 (ref 5–15)
BUN: 10 mg/dL (ref 6–20)
CALCIUM: 9.6 mg/dL (ref 8.9–10.3)
CO2: 29 mmol/L (ref 22–32)
Chloride: 101 mmol/L (ref 101–111)
Creatinine, Ser: 0.69 mg/dL (ref 0.44–1.00)
GFR calc Af Amer: >60 mL/min (ref >60)
GLUCOSE: 114 mg/dL — AB (ref 65–99)
Potassium: 3.8 mmol/L (ref 3.5–5.1)
SODIUM: 140 mmol/L (ref 135–145)

## 2017-09-06 LAB — TROPONIN I

## 2017-09-06 MED ORDER — IPRATROPIUM-ALBUTEROL 0.5-2.5 (3) MG/3ML IN SOLN
RESPIRATORY_TRACT | Status: AC
  Administered 2017-09-06: 3 mL via RESPIRATORY_TRACT
  Filled 2017-09-06: qty 9

## 2017-09-06 MED ORDER — IPRATROPIUM-ALBUTEROL 0.5-2.5 (3) MG/3ML IN SOLN
3.0000 mL | Freq: Once | RESPIRATORY_TRACT | Status: AC
  Administered 2017-09-06: 3 mL via RESPIRATORY_TRACT
  Filled 2017-09-06: qty 3

## 2017-09-06 MED ORDER — BUDESONIDE-FORMOTEROL FUMARATE 160-4.5 MCG/ACT IN AERO: 2.0000 | INHALATION_SPRAY | Freq: Two times a day (BID) | RESPIRATORY_TRACT | 12 refills | Status: DC

## 2017-09-06 MED ORDER — PREDNISONE 20 MG PO TABS
60.0000 mg | ORAL_TABLET | Freq: Once | ORAL | Status: AC
  Administered 2017-09-06: 60 mg via ORAL

## 2017-09-06 MED ORDER — PREDNISONE 10 MG (21) PO TBPK: ORAL_TABLET | Freq: Every day | ORAL | 0 refills | Status: DC

## 2017-09-06 MED ORDER — PREDNISONE 20 MG PO TABS
ORAL_TABLET | ORAL | Status: AC
  Administered 2017-09-06: 60 mg via ORAL
  Filled 2017-09-06: qty 3

## 2017-09-06 MED ORDER — ALBUTEROL SULFATE HFA 108 (90 BASE) MCG/ACT IN AERS: 2.0000 | INHALATION_SPRAY | Freq: Four times a day (QID) | RESPIRATORY_TRACT | 2 refills | Status: DC | PRN

## 2017-09-06 NOTE — ED Triage Notes (Addendum)
Pt to ED via POV with c/o SOB, hx of COPD and ran out of inhaler xfew days ago. Pt 95%on RA, hr 114 , RR even and unlabored, expir wheezes noted.

## 2017-09-06 NOTE — ED Provider Notes (Signed)
[facility] Emergency Department Provider Note       Time seen: ----------------------------------------- 4:28 PM on 09/06/2017 -----------------------------------------   I have reviewed the triage vital signs and the nursing notes.  HISTORY   Chief Complaint Shortness of Breath    HPI Carla Rivera is a 61 y.o. female with a history of COPD and hypertension who presents to the ED for shortness of breath.  Patient reports a history of COPD but ran out of her inhaler a few days ago.  Her previous doctor had retired.  She was taking Symbicort as well as albuterol and has not has COPD exacerbation in many months.  She denies fevers or chills, denies other complaints.  Past Medical History:  Diagnosis Date  . COPD (chronic obstructive pulmonary disease) (HCC)   . Depression   . HTN (hypertension)     Patient Active Problem List   Diagnosis Date Noted  . COPD exacerbation (HCC) 12/11/2016  . HTN (hypertension) 12/11/2016  . Depression 12/11/2016    Past Surgical History:  Procedure Laterality Date  . ABDOMINAL HYSTERECTOMY    . ECTOPIC PREGNANCY SURGERY      Allergies Penicillins  Social History Social History   Tobacco Use  . Smoking status: Former Games developermoker  . Smokeless tobacco: Never Used  Substance Use Topics  . Alcohol use: No  . Drug use: No    Review of Systems Constitutional: Negative for fever. Cardiovascular: Negative for chest pain. Respiratory: Positive for shortness of breath and cough Gastrointestinal: Negative for abdominal pain, vomiting and diarrhea. Genitourinary: Negative for dysuria. Musculoskeletal: Negative for back pain. Skin: Negative for rash. Neurological: Negative for headaches, focal weakness or numbness.  All systems negative/normal/unremarkable except as stated in the HPI  ____________________________________________   PHYSICAL EXAM:  VITAL SIGNS: ED Triage Vitals [09/06/17 1534]  Enc Vitals  Group     BP (!) 186/85     Pulse Rate (!) 111     Resp (!) 22     Temp 98.2 F (36.8 C)     Temp Source Oral     SpO2 95 %     Weight 128 lb (58.1 kg)     Height 5\' 4"  (1.626 m)     Head Circumference      Peak Flow      Pain Score 0     Pain Loc      Pain Edu?      Excl. in GC?     Constitutional: Alert and oriented. Well appearing and in no distress. Eyes: Conjunctivae are normal. Normal extraocular movements. ENT   Head: Normocephalic and atraumatic.   Nose: No congestion/rhinnorhea.   Mouth/Throat: Mucous membranes are moist.   Neck: No stridor. Cardiovascular: Normal rate, regular rhythm. No murmurs, rubs, or gallops. Respiratory: No tachypnea but wheezing bilaterally with prolonged expiration Gastrointestinal: Soft and nontender. Normal bowel sounds Musculoskeletal: Nontender with normal range of motion in extremities. No lower extremity tenderness nor edema. Neurologic:  Normal speech and language. No gross focal neurologic deficits are appreciated.  Skin:  Skin is warm, dry and intact. No rash noted. Psychiatric: Mood and affect are normal. Speech and behavior are normal.  ____________________________________________  EKG: Interpreted by me.  Sinus tachycardia with a rate of 115 bpm, normal PR interval, normal QRS, normal QT.  ____________________________________________  ED COURSE:  Pertinent labs & imaging results that were available during my care of the patient were reviewed by me and considered in my medical decision making (see chart  for details). Patient presents for COPD exacerbation, we will assess with labs and imaging as indicated.   Procedures ____________________________________________   LABS (pertinent positives/negatives)  Labs Reviewed  BASIC METABOLIC PANEL - Abnormal; Notable for the following components:      Result Value   Glucose, Bld 114 (*)    All other components within normal limits  CBC - Abnormal; Notable for the  following components:   WBC 3.5 (*)    RBC 5.28 (*)    All other components within normal limits  TROPONIN I    RADIOLOGY  Chest x-ray IMPRESSION: Extensive bullous emphysematous type change with areas of scarring. No edema or consolidation. Stable cardiac silhouette.  Emphysema (ICD10-J43.9). ____________________________________________  DIFFERENTIAL DIAGNOSIS   Pneumonia, URI, COPD exacerbation, PE, pneumothorax  FINAL ASSESSMENT AND PLAN  COPD exacerbation   Plan: Patient had presented for difficulty breathing. Patient's labs are reassuring. Patient's imaging does reveal extensive emphysematous changes but no consolidation.  She has run out of her inhalers and I will prescribe inhalers as well as steroids.  She will be referred to medication management for help with filling her medicines.   Emily FilbertWilliams, Jaiyanna Safran E, MD   Note: This note was generated in part or whole with voice recognition software. Voice recognition is usually quite accurate but there are transcription errors that can and very often do occur. I apologize for any typographical errors that were not detected and corrected.     Emily FilbertWilliams, Yashar Inclan E, MD 09/06/17 [phone]

## 2018-03-23 IMAGING — CR DG CHEST 2V
2 series · 2 of 2 positions shown · non-contrast
Comparison: Chest radiograph December 11, 2016 and chest CT December 11, 2016

CLINICAL DATA: Shortness of breath.

EXAM:
CHEST  2 VIEW

[chest pa]
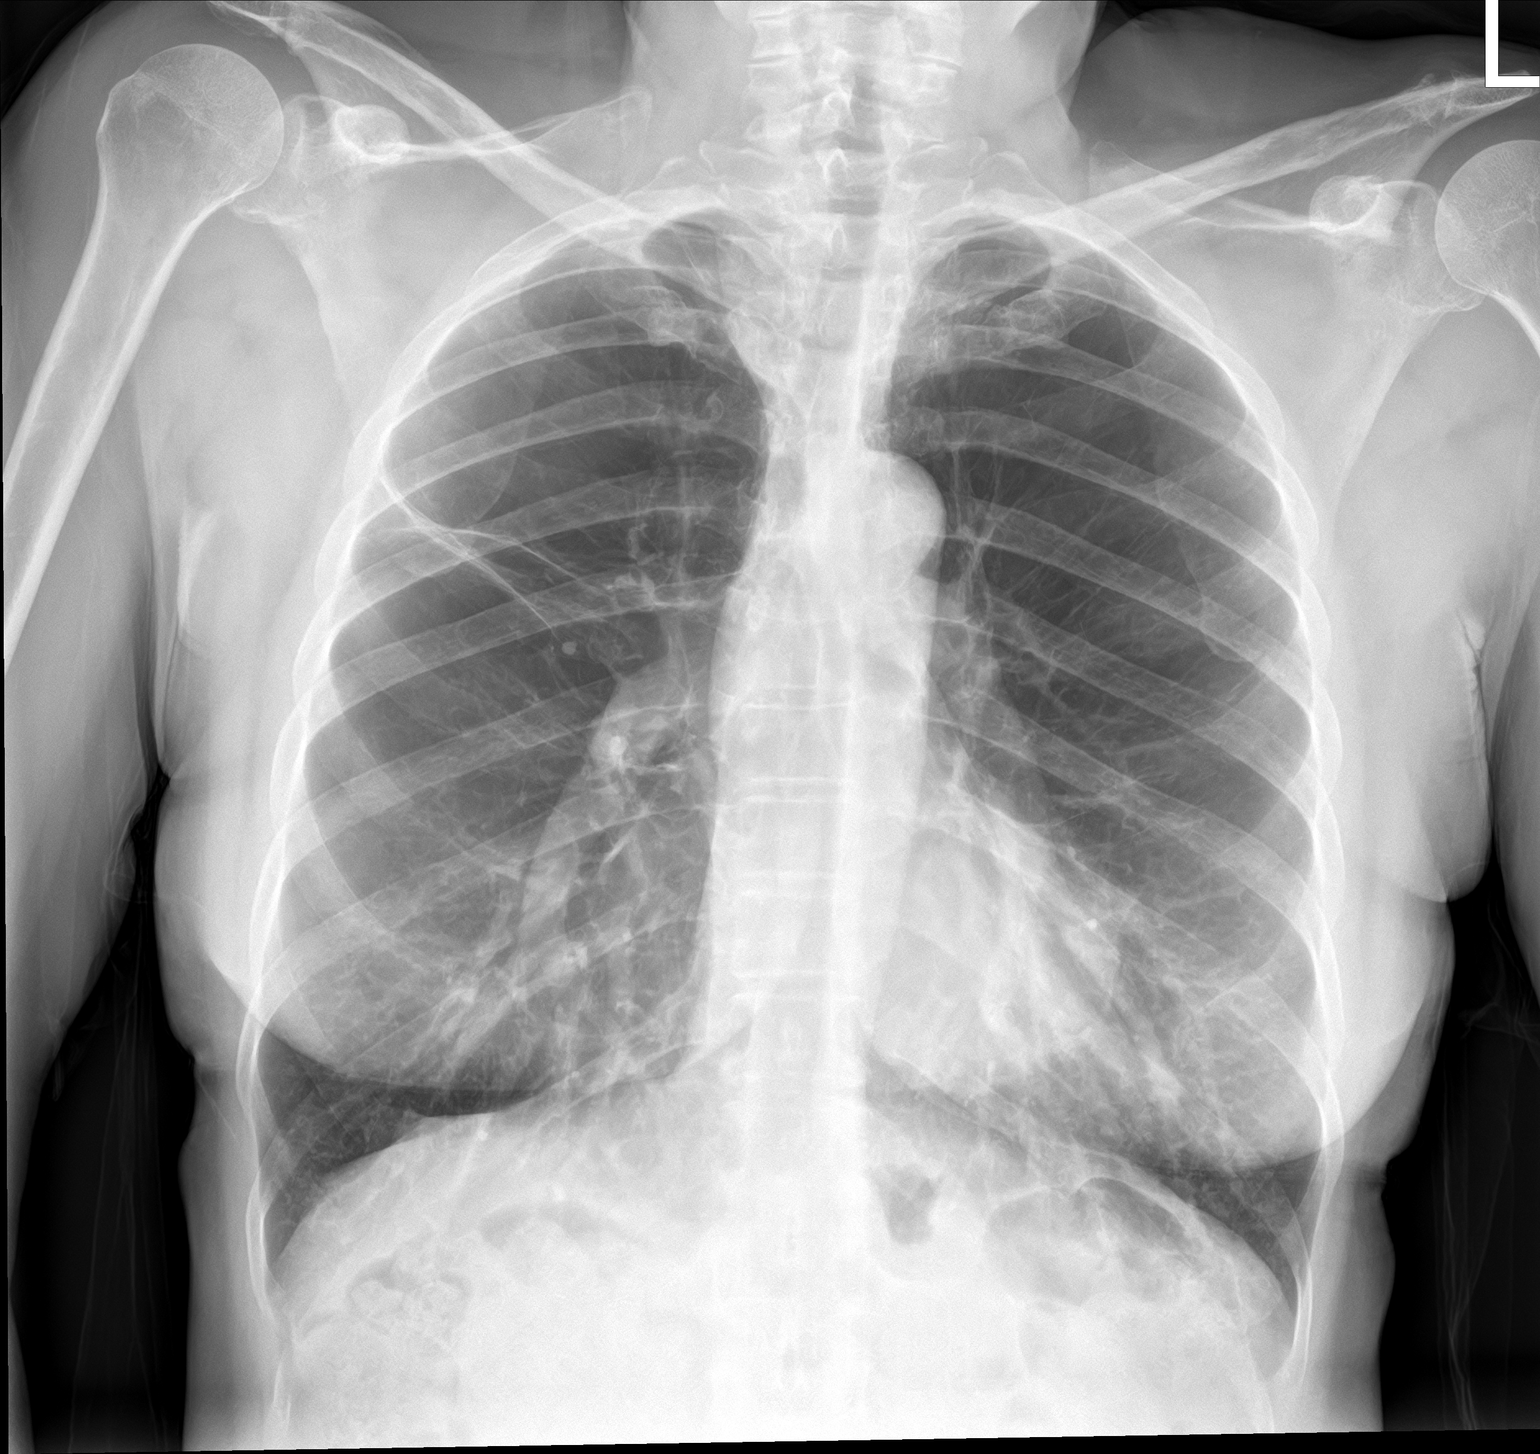

[chest lat]
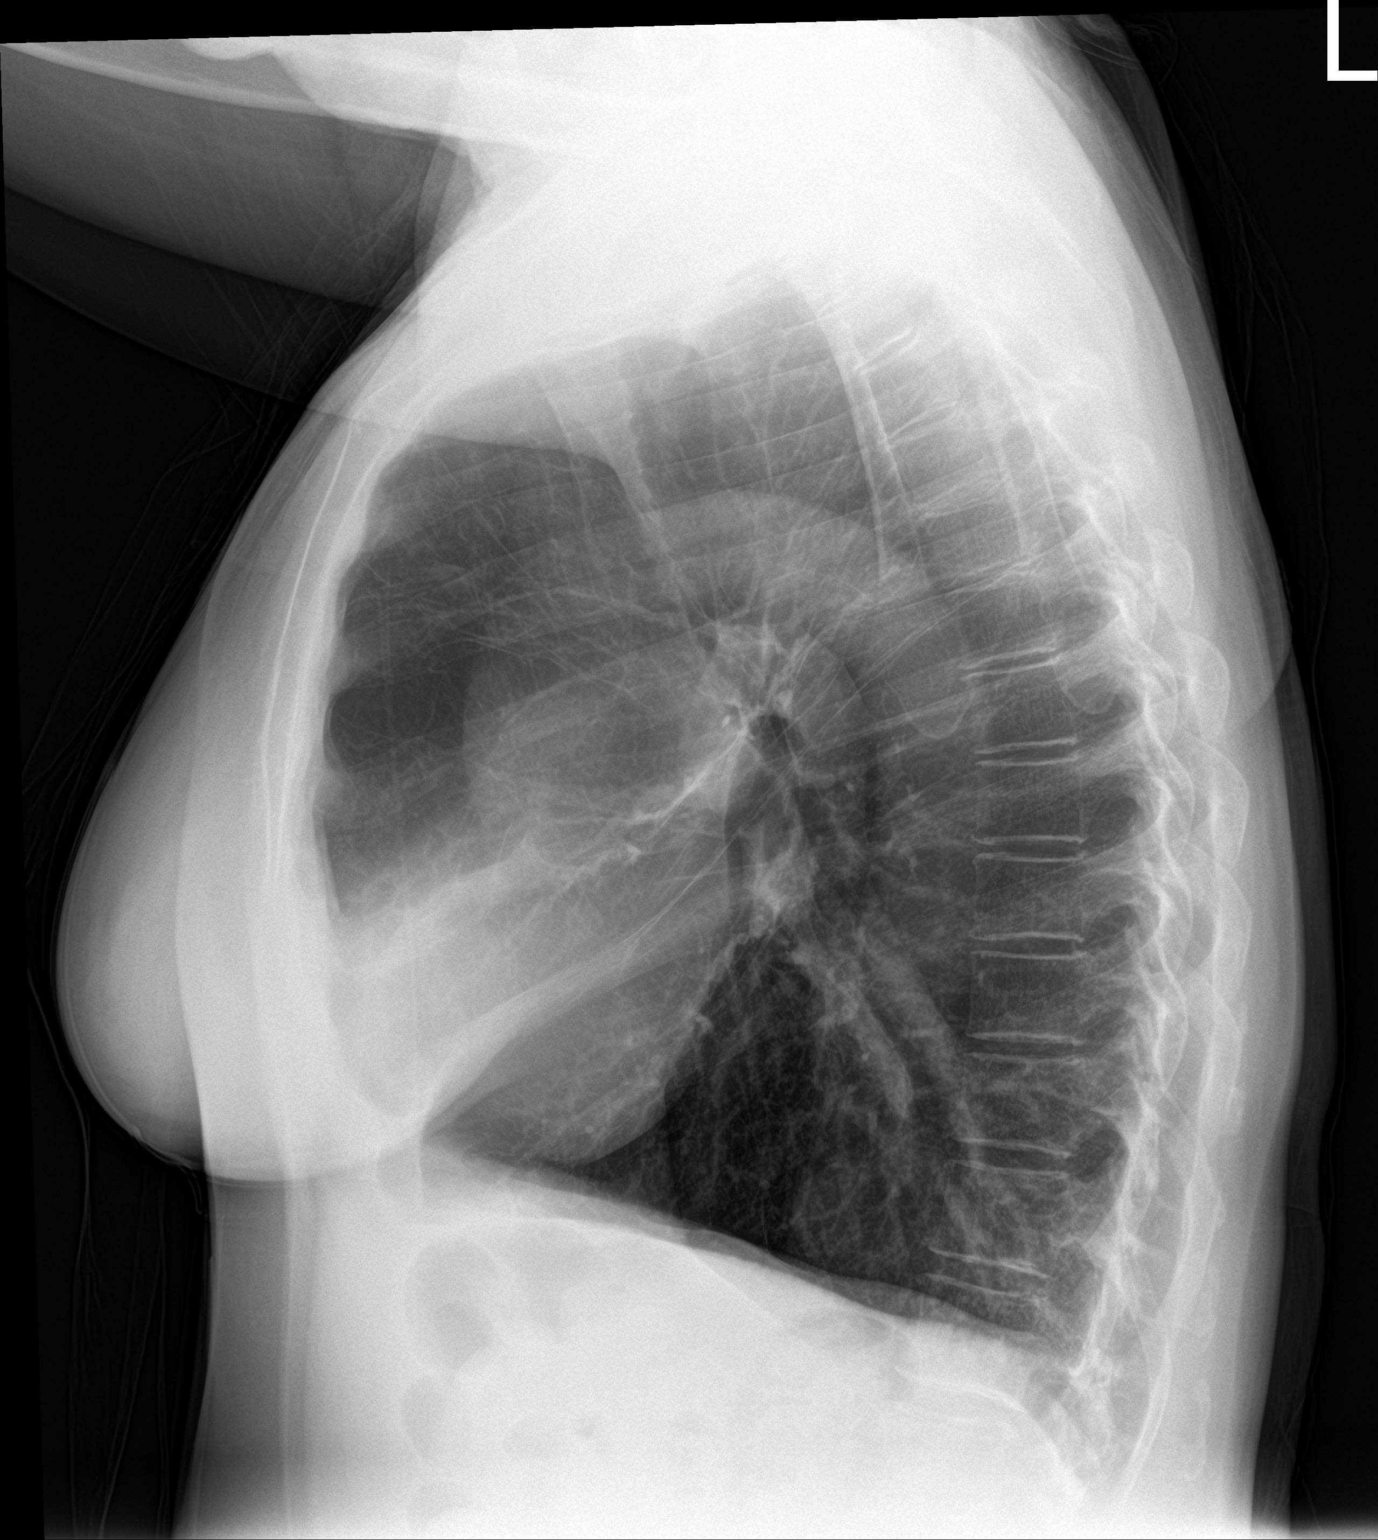

[2 of 2 positions shown; findings below may reference images not displayed]

FINDINGS: There is extensive bullous emphysematous change. There is scarring
in both upper lobes, more severe on the right than on the left.
There is no edema or consolidation. Heart size is normal. Pulmonary
vascularity reflects the underlying emphysematous change.
Interstitial prominence in the lung bases is likely due to
redistribution of blood flow to viable lung segments.

No adenopathy evident.  No bone lesions.
IMPRESSION: Extensive bullous emphysematous type change with areas of scarring.
No edema or consolidation. Stable cardiac silhouette.

Emphysema ([id number]).

## 2020-04-21 ENCOUNTER — Observation Stay (HOSPITAL_COMMUNITY)
Admission: EM | Admit: 2020-04-21 | Discharge: 2020-04-23 | Disposition: A | Discharge status: Home / Self Care | Payer: Medicare HMO | Attending: Internal Medicine | Admitting: Internal Medicine

## 2020-04-21 ENCOUNTER — Encounter (HOSPITAL_COMMUNITY): Payer: Self-pay | Admitting: Emergency Medicine

## 2020-04-21 ENCOUNTER — Other Ambulatory Visit: Payer: Self-pay

## 2020-04-21 DIAGNOSIS — Z20822 Contact with and (suspected) exposure to covid-19: Secondary | ICD-10-CM | POA: Insufficient documentation

## 2020-04-21 DIAGNOSIS — B370 Candidal stomatitis: Principal | ICD-10-CM | POA: Insufficient documentation

## 2020-04-21 DIAGNOSIS — E860 Dehydration: Secondary | ICD-10-CM | POA: Diagnosis not present

## 2020-04-21 DIAGNOSIS — J029 Acute pharyngitis, unspecified: Secondary | ICD-10-CM | POA: Diagnosis present

## 2020-04-21 DIAGNOSIS — B3781 Candidal esophagitis: Secondary | ICD-10-CM | POA: Diagnosis present

## 2020-04-21 DIAGNOSIS — I10 Essential (primary) hypertension: Secondary | ICD-10-CM | POA: Diagnosis not present

## 2020-04-21 DIAGNOSIS — J449 Chronic obstructive pulmonary disease, unspecified: Secondary | ICD-10-CM | POA: Insufficient documentation

## 2020-04-21 DIAGNOSIS — R000 Tachycardia, unspecified: Secondary | ICD-10-CM | POA: Insufficient documentation

## 2020-04-21 DIAGNOSIS — Z87891 Personal history of nicotine dependence: Secondary | ICD-10-CM | POA: Insufficient documentation

## 2020-04-21 DIAGNOSIS — R1310 Dysphagia, unspecified: Secondary | ICD-10-CM

## 2020-04-21 DIAGNOSIS — N179 Acute kidney failure, unspecified: Secondary | ICD-10-CM

## 2020-04-21 LAB — BASIC METABOLIC PANEL
Anion gap: 18 — ABNORMAL HIGH (ref 5–15)
BUN: 34 mg/dL — ABNORMAL HIGH (ref 8–23)
CO2: 25 mmol/L (ref 22–32)
Calcium: 9.9 mg/dL (ref 8.9–10.3)
Chloride: 103 mmol/L (ref 98–111)
Creatinine, Ser: 1.21 mg/dL — ABNORMAL HIGH (ref 0.44–1.00)
GFR calc Af Amer: 54 mL/min — ABNORMAL LOW (ref >60)
GFR calc non Af Amer: 47 mL/min — ABNORMAL LOW (ref >60)
Glucose, Bld: 107 mg/dL — ABNORMAL HIGH (ref 70–99)
Potassium: 3.4 mmol/L — ABNORMAL LOW (ref 3.5–5.1)
Sodium: 146 mmol/L — ABNORMAL HIGH (ref 135–145)

## 2020-04-21 LAB — CBC
HCT: 46.5 % — ABNORMAL HIGH (ref 36.0–46.0)
Hemoglobin: 14.7 g/dL (ref 12.0–15.0)
MCH: 28.3 pg (ref 26.0–34.0)
MCHC: 31.6 g/dL (ref 30.0–36.0)
MCV: 89.4 fL (ref 80.0–100.0)
Platelets: 328 10*3/uL (ref 150–400)
RBC: 5.2 MIL/uL — ABNORMAL HIGH (ref 3.87–5.11)
RDW: 13.9 % (ref 11.5–15.5)
WBC: 5.9 10*3/uL (ref 4.0–10.5)
nRBC: 0 % (ref 0.0–0.2)

## 2020-04-21 NOTE — ED Triage Notes (Signed)
Patient reports she was recently being treated for pneumonia and took last dose of Levaquin on Thursday. Stopped her antibiotics due to possible allergic reaction. Patient reports she is now having difficulty swallowing secondary to her throat "hurting" Patient is poor historian.

## 2020-04-22 ENCOUNTER — Emergency Department (HOSPITAL_COMMUNITY): Payer: Medicare HMO

## 2020-04-22 ENCOUNTER — Other Ambulatory Visit: Payer: Self-pay

## 2020-04-22 DIAGNOSIS — B3781 Candidal esophagitis: Secondary | ICD-10-CM | POA: Diagnosis present

## 2020-04-22 DIAGNOSIS — R1310 Dysphagia, unspecified: Secondary | ICD-10-CM

## 2020-04-22 LAB — HEPATIC FUNCTION PANEL
ALT: 15 U/L (ref 0–44)
AST: 23 U/L (ref 15–41)
Albumin: 3.9 g/dL (ref 3.5–5.0)
Alkaline Phosphatase: 46 U/L (ref 38–126)
Bilirubin, Direct: 0.2 mg/dL (ref 0.0–0.2)
Indirect Bilirubin: 1.9 mg/dL — ABNORMAL HIGH (ref 0.3–0.9)
Total Bilirubin: 2.1 mg/dL — ABNORMAL HIGH (ref 0.3–1.2)
Total Protein: 7.1 g/dL (ref 6.5–8.1)

## 2020-04-22 LAB — CBG MONITORING, ED: Glucose-Capillary: 96 mg/dL (ref 70–99)

## 2020-04-22 LAB — LIPASE, BLOOD: Lipase: 23 U/L (ref 11–51)

## 2020-04-22 LAB — SARS CORONAVIRUS 2 BY RT PCR (HOSPITAL ORDER, PERFORMED IN ~~LOC~~ HOSPITAL LAB): SARS Coronavirus 2: NEGATIVE

## 2020-04-22 MED ORDER — FLUCONAZOLE 100MG IVPB
100.0000 mg | INTRAVENOUS | Status: DC
  Administered 2020-04-23: 100 mg via INTRAVENOUS
  Filled 2020-04-22: qty 50

## 2020-04-22 MED ORDER — LIDOCAINE VISCOUS HCL 2 % MT SOLN
15.0000 mL | Freq: Once | OROMUCOSAL | Status: AC
  Administered 2020-04-22: 15 mL via ORAL
  Filled 2020-04-22: qty 15

## 2020-04-22 MED ORDER — LIDOCAINE VISCOUS HCL 2 % MT SOLN: 10.0000 mL | Freq: Three times a day (TID) | OROMUCOSAL | Status: DC | PRN

## 2020-04-22 MED ORDER — IPRATROPIUM-ALBUTEROL 0.5-2.5 (3) MG/3ML IN SOLN: 3.0000 mL | RESPIRATORY_TRACT | Status: DC | PRN

## 2020-04-22 MED ORDER — NYSTATIN 100000 UNIT/ML MT SUSP
5.0000 mL | Freq: Once | OROMUCOSAL | Status: AC
  Administered 2020-04-22: 500000 [IU] via ORAL
  Filled 2020-04-22: qty 5

## 2020-04-22 MED ORDER — ONDANSETRON HCL 4 MG/2ML IJ SOLN: 4.0000 mg | Freq: Four times a day (QID) | INTRAMUSCULAR | Status: DC | PRN

## 2020-04-22 MED ORDER — ALBUTEROL SULFATE HFA 108 (90 BASE) MCG/ACT IN AERS
2.0000 | INHALATION_SPRAY | Freq: Four times a day (QID) | RESPIRATORY_TRACT | Status: DC | PRN
  Filled 2020-04-22: qty 6.7

## 2020-04-22 MED ORDER — ALUM & MAG HYDROXIDE-SIMETH 200-200-20 MG/5ML PO SUSP
30.0000 mL | Freq: Once | ORAL | Status: AC
  Administered 2020-04-22: 30 mL via ORAL
  Filled 2020-04-22: qty 30

## 2020-04-22 MED ORDER — SODIUM CHLORIDE 0.9 % IV BOLUS
1000.0000 mL | Freq: Once | INTRAVENOUS | Status: AC
  Administered 2020-04-22: 1000 mL via INTRAVENOUS

## 2020-04-22 MED ORDER — FLUCONAZOLE IN SODIUM CHLORIDE 200-0.9 MG/100ML-% IV SOLN
200.0000 mg | Freq: Once | INTRAVENOUS | Status: AC
  Administered 2020-04-22: 200 mg via INTRAVENOUS
  Filled 2020-04-22 (×2): qty 100

## 2020-04-22 MED ORDER — FLUCONAZOLE IN SODIUM CHLORIDE 200-0.9 MG/100ML-% IV SOLN: 200.0000 mg | INTRAVENOUS | Status: DC

## 2020-04-22 MED ORDER — KCL IN DEXTROSE-NACL 40-5-0.9 MEQ/L-%-% IV SOLN
INTRAVENOUS | Status: AC
  Filled 2020-04-22 (×2): qty 1000

## 2020-04-22 MED ORDER — FLUCONAZOLE IN SODIUM CHLORIDE 400-0.9 MG/200ML-% IV SOLN: 400.0000 mg | Freq: Once | INTRAVENOUS | Status: DC

## 2020-04-22 MED ORDER — ENOXAPARIN SODIUM 40 MG/0.4ML ~~LOC~~ SOLN
40.0000 mg | SUBCUTANEOUS | Status: DC
  Filled 2020-04-22 (×2): qty 0.4

## 2020-04-22 MED ORDER — NYSTATIN 100000 UNIT/ML MT SUSP
500000.0000 [IU] | Freq: Four times a day (QID) | OROMUCOSAL | 0 refills | Status: DC
Start: 2020-04-22 — End: 2023-10-10

## 2020-04-22 NOTE — ED Notes (Signed)
Patient was toileted and is now resting comfortably.

## 2020-04-22 NOTE — ED Provider Notes (Signed)
[facility] EMERGENCY DEPARTMENT Provider Note   CSN: [id number] Arrival date & time: 04/21/20  1706     History Chief Complaint  Patient presents with  . Sore Throat    Carla Rivera is a 64 y.o. female.  Patient with history of COPD on home oxygen as well as hypertension.  She is here with a 4-day history of sore throat and what sounds like dysphagia and difficulty swallowing.  States she is able to swallow liquids or foods.  She believes she is having a "allergic reaction" to Levaquin which she was prescribed for pneumonia by her pulmonologist.  She had this prescribed on July 7 but didn't get it filled until last week but states she only took it for 4 days, last dose 6 days ago.  For the past 5 days she has had severe pain in her throat with difficulty swallowing and not able to keep any foods or liquids down.  She is concerned she could have thrush.  She denies any fevers, chills.  She denies any chest pain.  She feels her breathing is at baseline.  States she cannot swallow and anything she tries to swallow, grow back out.  She is had no history of any kind of esophageal problems in the past.  Denies any cardiac history. States her breathing is at baseline.  She recently saw a new pulmonologist at [facility] who prescribed Levaquin and told her to stop taking this medication as it was causing her pain.  The history is provided by the patient.  Sore Throat Associated symptoms include shortness of breath. Pertinent negatives include no chest pain, no abdominal pain and no headaches.       Past Medical History:  Diagnosis Date  . COPD (chronic obstructive pulmonary disease) (HCC)   . Depression   . HTN (hypertension)     Patient Active Problem List   Diagnosis Date Noted  . COPD exacerbation (HCC) 12/11/2016  . HTN (hypertension) 12/11/2016  . Depression 12/11/2016    Past Surgical History:  Procedure Laterality Date  . ABDOMINAL HYSTERECTOMY    . ECTOPIC  PREGNANCY SURGERY       OB History   No obstetric history on file.     Family History  Problem Relation Age of Onset  . Renal Disease Mother   . Hypertension Mother     Social History   Tobacco Use  . Smoking status: Former Games developer  . Smokeless tobacco: Never Used  Substance Use Topics  . Alcohol use: No  . Drug use: No    Home Medications Prior to Admission medications   Medication Sig Start Date End Date Taking? Authorizing Provider  albuterol (PROVENTIL HFA;VENTOLIN HFA) 108 (90 Base) MCG/ACT inhaler Inhale 2 puffs into the lungs every 6 (six) hours as needed for wheezing or shortness of breath.    [provider]  albuterol (PROVENTIL HFA;VENTOLIN HFA) 108 (90 Base) MCG/ACT inhaler Inhale 2 puffs into the lungs every 6 (six) hours as needed for wheezing or shortness of breath. 09/06/17   Emily Filbert, MD  budesonide-formoterol [facility]) 160-4.5 MCG/ACT inhaler Inhale 2 puffs into the lungs 2 (two) times daily.    [provider]  budesonide-formoterol (SYMBICORT) 160-4.5 MCG/ACT inhaler Inhale 2 puffs into the lungs 2 (two) times daily. 09/06/17   Emily Filbert, MD  citalopram (CELEXA) 10 MG tablet Take 10 mg by mouth daily.  11/26/16   [provider]  ipratropium-albuterol (DUONEB) 0.5-2.5 (3) MG/3ML SOLN Take 3  mLs by nebulization every 4 (four) hours as needed. 12/12/16   Enedina FinnerPatel, Sona, MD  levofloxacin (LEVAQUIN) 500 MG tablet Take 500 mg by mouth daily. For 10 days 04/13/20   [provider]  predniSONE (DELTASONE) 10 MG tablet Take 5 tablets (50 mg total) by mouth daily with breakfast. Start 50 mg daily then taper 10 mg daily and stop 12/13/16   Enedina FinnerPatel, Sona, MD  predniSONE (STERAPRED UNI-PAK 21 TAB) 10 MG (21) TBPK tablet Take by mouth daily. Dispense steroid taper pack as directed 09/06/17   Emily FilbertWilliams, Jonathan E, MD    Allergies    Penicillins  Review of Systems   Review of Systems  Constitutional: Positive for activity  change, appetite change and fatigue. Negative for fever.  HENT: Positive for sore throat and trouble swallowing.   Respiratory: Positive for shortness of breath. Negative for cough.   Cardiovascular: Negative for chest pain.  Gastrointestinal: Negative for abdominal pain, nausea and vomiting.  Genitourinary: Negative for dysuria.  Musculoskeletal: Negative for arthralgias and myalgias.  Neurological: Negative for dizziness, weakness and headaches.   all other systems are negative except as noted in the HPI and PMH.    Physical Exam Updated Vital Signs BP (!) 149/99   Pulse (!) 111   Temp 97.8 F (36.6 C) (Oral)   Resp 20   Ht 5\' 4"  (1.626 m)   Wt 58.1 kg   SpO2 95%   BMI 21.99 kg/m   Physical Exam Vitals and nursing note reviewed.  Constitutional:      General: She is not in acute distress.    Appearance: She is well-developed. She is ill-appearing.     Comments: Chronically ill-appearing  HENT:     Head: Normocephalic and atraumatic.     Mouth/Throat:     Mouth: Mucous membranes are dry.     Pharynx: No oropharyngeal exudate.     Comments: White plaque on tongue and soft palate.  Uvula is midline, no abscess.  Controlling secretions. Eyes:     Conjunctiva/sclera: Conjunctivae normal.     Pupils: Pupils are equal, round, and reactive to light.  Neck:     Comments: No meningismus. Cardiovascular:     Rate and Rhythm: Regular rhythm. Tachycardia present.     Heart sounds: Normal heart sounds. No murmur heard.      Comments: Tachycardia in the 120s Pulmonary:     Effort: Pulmonary effort is normal. No respiratory distress.     Breath sounds: Normal breath sounds.     Comments: Diminished breath sounds bilaterally Abdominal:     Palpations: Abdomen is soft.     Tenderness: There is no abdominal tenderness. There is no guarding or rebound.  Musculoskeletal:        General: No tenderness. Normal range of motion.     Cervical back: Normal range of motion and neck  supple.  Skin:    General: Skin is warm.  Neurological:     General: No focal deficit present.     Mental Status: She is alert and oriented to person, place, and time. Mental status is at baseline.     Cranial Nerves: No cranial nerve deficit.     Motor: No abnormal muscle tone.     Coordination: Coordination normal.     Comments:  5/5 strength throughout. CN 2-12 intact.Equal grip strength.   Psychiatric:        Behavior: Behavior normal.     ED Results / Procedures / Treatments   Labs (all  labs ordered are listed, but only abnormal results are displayed) Labs Reviewed  CBC - Abnormal; Notable for the following components:      Result Value   RBC 5.20 (*)    HCT 46.5 (*)    All other components within normal limits  BASIC METABOLIC PANEL - Abnormal; Notable for the following components:   Sodium 146 (*)    Potassium 3.4 (*)    Glucose, Bld 107 (*)    BUN 34 (*)    Creatinine, Ser 1.21 (*)    GFR calc non Af Amer 47 (*)    GFR calc Af Amer 54 (*)    Anion gap 18 (*)    All other components within normal limits  HEPATIC FUNCTION PANEL  LIPASE, BLOOD  CBG MONITORING, ED    EKG EKG Interpretation  Date/Time:  Wednesday April 22 2020 07:06:39 EDT Ventricular Rate:  119 PR Interval:    QRS Duration: 63 QT Interval:  369 QTC Calculation: 522 R Axis:   69 Text Interpretation: probably sinus Low voltage, precordial leads Borderline abnrm T, anterolateral leads Minimal ST elevation, inferior leads Prolonged QT interval Interpretation limited secondary to artifact Confirmed by Glynn Octave [phone]) on 04/22/2020 7:10:43 AM   Radiology DG Neck Soft Tissue  Result Date: 04/22/2020 CLINICAL DATA:  Sore throat. EXAM: NECK SOFT TISSUES - 1+ VIEW COMPARISON:  None. FINDINGS: There is no evidence of retropharyngeal soft tissue swelling or epiglottic enlargement. The cervical airway is unremarkable and no radio-opaque foreign body identified. Mild-to-moderate cervical  spondylosis is noted. The patient is edentulous. IMPRESSION: No evidence of retropharyngeal or epiglottic swelling. Electronically Signed   By: Sebastian Ache M.D.   On: 04/22/2020 07:21   DG Chest 2 View  Result Date: 04/22/2020 CLINICAL DATA:  Sore throat. EXAM: CHEST - 2 VIEW COMPARISON:  09/06/2017 FINDINGS: The cardiomediastinal silhouette is unchanged with normal heart size. The lungs are hyperinflated with advanced emphysema again noted. No confluent airspace opacity, edema, pleural effusion, pneumothorax is identified. Telemetry leads overlie the chest. No acute osseous abnormality is seen. IMPRESSION: COPD without evidence of acute cardiopulmonary process. Electronically Signed   By: Sebastian Ache M.D.   On: 04/22/2020 07:24    Procedures Procedures (including critical care time)  Medications Ordered in ED Medications  sodium chloride 0.9 % bolus 1,000 mL (has no administration in time range)  alum & mag hydroxide-simeth (MAALOX/MYLANTA) 200-200-20 MG/5ML suspension 30 mL (has no administration in time range)    And  lidocaine (XYLOCAINE) 2 % viscous mouth solution 15 mL (has no administration in time range)  nystatin (MYCOSTATIN) 100000 UNIT/ML suspension 500,000 Units (has no administration in time range)    ED Course  I have reviewed the triage vital signs and the nursing notes.  Pertinent labs & imaging results that were available during my care of the patient were reviewed by me and considered in my medical decision making (see chart for details).    MDM Rules/Calculators/A&P                         4 days of painful swallowing and "spitting up everything".  Patient thinks allergic reaction to Levaquin but she has not had this for 6 days.  She does appear to be dehydrated with tachycardia on exam.  She has questionable thrush involving her tongue and soft palate.  There is no asymmetry to her uvula.  She is controlling her secretions.  Labs show mild AKI.  She is given IV  hydration.  Also will give GI cocktail as well as p.o. nystatin.  May have component of GERD as well. Chest x-ray today shows no pneumonia.  Soft tissue neck x-rays negative.  Patient will need to have fluid resuscitation and then reassess for symptom control and improvement in her tachycardia.  Her odynophagia may be due to thrush and possibly acid reflux as well.  Low suspicion for esophageal food impaction.  Low suspicion for airway obstruction. Neck soft tissue x-ray does not show any thickening of the epiglottis or retropharyngeal swelling.   Continue IV fluids.  Care transferred at shift change to Dr. Anitra Lauth who will reassess for improvement in heart rate as well as symptom control.  Will need to tolerate p.o. before consideration of discharge. Final Clinical Impression(s) / ED Diagnoses Final diagnoses:  Thrush  Dehydration    Rx / DC Orders ED Discharge Orders    None       Oley Lahaie, Jeannett Senior, MD 04/22/20 [phone]

## 2020-04-22 NOTE — Consult Note (Addendum)
[facility] Consult: 2:23 PM 04/22/2020  LOS: 0 days    Referring Provider: Dr Criselda Peaches  Primary Care Physician:  Derwood Kaplan, MD Primary Gastroenterologist:  unassigned     Reason for Consultation:  Dysphagia.     HPI: Carla Rivera is a 64 y.o. female.  Hx depression.  COPD.  No previous upper endoscopy or colonoscopy  Switched from Symbicort to Trelegy MDI and started on as needed nasal oxygen to address increased dyspnea.  6/30 .  She says that the powder from that new inhaler is difficult to get into her airway and that she ends up with powder all over her mouth when she uses it.  Received a phone call regarding chest x-ray follow-up and was prescribed Levaquin which she started on 7/10.  She took this for about 4 days but developed dysphagia in the region of the upper cervical esophagus and after calling the physician back, she was advised to stop the Levaquin.  Patient's dysphagia started on Friday.  She is unable to swallow almost everything except for small amounts of liquids.  She has regurgitation but no nausea.  She is urinating okay.   Chest and neck films are unremarkable. COVID-19 negative WBCs normal.  Potassium low at 3.4. T bili 2.1, indirect bilirubin 1.9, otherwise normal transaminases and lipase.  Received Maalox and viscous lidocaine and oral nystatin in the ED. currently able to tolerate ice chips and small amounts of liquids but she regurgitated applesauce back up. IV Diflucan ordered but has not yet been infused.    Past Medical History:  Diagnosis Date  . COPD (chronic obstructive pulmonary disease) (HCC)   . Depression   . HTN (hypertension)     Past Surgical History:  Procedure Laterality Date  . ABDOMINAL HYSTERECTOMY    . ECTOPIC PREGNANCY SURGERY      Prior to  Admission medications   Medication Sig Start Date End Date Taking? Authorizing Provider  albuterol (PROVENTIL HFA;VENTOLIN HFA) 108 (90 Base) MCG/ACT inhaler Inhale 2 puffs into the lungs every 6 (six) hours as needed for wheezing or shortness of breath. 09/06/17  Yes Emily Filbert, MD  aspirin EC 81 MG tablet Take 81 mg by mouth daily. Swallow whole.   Yes [provider]  budesonide-formoterol (SYMBICORT) 160-4.5 MCG/ACT inhaler Inhale 2 puffs into the lungs 2 (two) times daily. 09/06/17  Yes Emily Filbert, MD  hydrochlorothiazide (HYDRODIURIL) 25 MG tablet Take 25 mg by mouth daily. 03/02/20  Yes [provider]  levofloxacin (LEVAQUIN) 500 MG tablet Take 500 mg by mouth daily. For 10 days 04/13/20  Yes [provider]  solifenacin (VESICARE) 5 MG tablet Take 5 mg by mouth daily.   Yes [provider]  ipratropium-albuterol (DUONEB) 0.5-2.5 (3) MG/3ML SOLN Take 3 mLs by nebulization every 4 (four) hours as needed. Patient not taking: Reported on 04/22/2020 12/12/16   Enedina Finner, MD  nystatin (MYCOSTATIN) 100000 UNIT/ML suspension Take 5 mLs (500,000 Units total) by mouth 4 (four) times daily. 04/22/20   Glynn Octave, MD  Scheduled Meds: . enoxaparin (LOVENOX) injection  40 mg Subcutaneous Q24H   Infusions: . dextrose 5 % and 0.9 % NaCl with KCl 40 mEq/L    . fluconazole (DIFLUCAN) IV     And  . [START ON 04/23/2020] fluconazole (DIFLUCAN) IV     PRN Meds: albuterol, ipratropium-albuterol, lidocaine, ondansetron (ZOFRAN) IV   Allergies as of 04/21/2020 - Review Complete 04/21/2020  Allergen Reaction Noted  . Penicillins Rash and Other (See Comments) 12/11/2016    Family History  Problem Relation Age of Onset  . Renal Disease Mother   . Hypertension Mother     Social History   Socioeconomic History  . Marital status: Single    Spouse name: Not on file  . Number of children: Not on file  . Years of education: Not on file  .  Highest education level: Not on file  Occupational History  . Not on file  Tobacco Use  . Smoking status: Former Games developermoker  . Smokeless tobacco: Never Used  Substance and Sexual Activity  . Alcohol use: No  . Drug use: No  . Sexual activity: Not on file  Other Topics Concern  . Not on file  Social History Narrative  . Not on file   Social Determinants of Health   Financial Resource Strain:   . Difficulty of Paying Living Expenses:   Food Insecurity:   . Worried About Programme researcher, broadcasting/film/videounning Out of Food in the Last Year:   . Baristaan Out of Food in the Last Year:   Transportation Needs:   . Freight forwarderLack of Transportation (Medical):   Marland Kitchen. Lack of Transportation (Non-Medical):   Physical Activity:   . Days of Exercise per Week:   . Minutes of Exercise per Session:   Stress:   . Feeling of Stress :   Social Connections:   . Frequency of Communication with Friends and Family:   . Frequency of Social Gatherings with Friends and Family:   . Attends Religious Services:   . Active Member of Clubs or Organizations:   . Attends BankerClub or Organization Meetings:   Marland Kitchen. Marital Status:   Intimate Partner Violence:   . Fear of Current or Ex-Partner:   . Emotionally Abused:   Marland Kitchen. Physically Abused:   . Sexually Abused:     REVIEW OF SYSTEMS: Constitutional: Feels tired and weak.  Nothing profound. ENT:  No nose bleeds Pulm: Breathing at baseline.  Some clear productive cough. CV:  No palpitations, no LE edema.  No angina GU:  No hematuria, no frequency GI: Normally has had no issues with reflux disease, dysphagia, altered bowel habits or GI bleeding. Heme: Denies unusual or excessive bleeding or bruising Transfusions: None Neuro:  No headaches, no peripheral tingling or numbness.  No dizziness, seizures, syncope. Derm:  No itching, no rash or sores.  Endocrine:  No sweats or chills.  No polyuria or dysuria Immunization: Not queried Travel:  None beyond local counties in last few months.    PHYSICAL EXAM: Vital  signs in last 24 hours: Vitals:   04/22/20 1300 04/22/20 1318  BP: 110/68   Pulse: 90 79  Resp: (!) 24 16  Temp:    SpO2:  98%   Wt Readings from Last 3 Encounters:  04/21/20 58.1 kg  09/06/17 58.1 kg  12/11/16 63.5 kg    General: Thin, looks old for age.  Alert, comfortable and able to provide good history Head: No facial asymmetry or swelling.  No signs of head trauma. Eyes: No icterus,  no pallor Ears: No hearing deficit Nose: No congestion or discharge Mouth: Some geographic type small oval eyelets at the front of the tongue.  No obvious Candida.  Mucosa is moist and pink.  Edentulous. Neck: No JVD, no masses, no thyromegaly Lungs: Diminished breath sounds throughout.  No difficulty breathing.  No cough Heart: RRR.  No MRG.  S1, S2 present Abdomen: Soft without tenderness.  No masses, HSM, bruits, hernias.  Bowel sounds active.   Rectal: Deferred Musc/Skeltl: No joint redness, swelling or gross deformities. Extremities: No CCE. Neurologic: Alert.  Fully oriented x3.  Moves all 4 limbs, strength not tested but grossly no weakness.  No tremors. Skin: No rash, sores or suspicious lesions Nodes: No cervical adenopathy Psych: Calm, cooperative, pleasant.  Intake/Output from previous day: No intake/output data recorded. Intake/Output this shift: Total I/O In: 1000 [IV Piggyback:1000] Out: -   LAB RESULTS: Recent Labs    04/21/20 1735  WBC 5.9  HGB 14.7  HCT 46.5*  PLT 328   BMET Lab Results  Component Value Date   NA 146 (H) 04/21/2020   NA 140 09/06/2017   NA 140 12/12/2016   K 3.4 (L) 04/21/2020   K 3.8 09/06/2017   K 3.5 12/12/2016   CL 103 04/21/2020   CL 101 09/06/2017   CL 106 12/12/2016   CO2 25 04/21/2020   CO2 29 09/06/2017   CO2 25 12/12/2016   GLUCOSE 107 (H) 04/21/2020   GLUCOSE 114 (H) 09/06/2017   GLUCOSE 234 (H) 12/12/2016   BUN 34 (H) 04/21/2020   BUN 10 09/06/2017   BUN 13 12/12/2016   CREATININE 1.21 (H) 04/21/2020   CREATININE  0.69 09/06/2017   CREATININE 0.70 12/12/2016   CALCIUM 9.9 04/21/2020   CALCIUM 9.6 09/06/2017   CALCIUM 8.8 (L) 12/12/2016   LFT Recent Labs    04/22/20 1118  PROT 7.1  ALBUMIN 3.9  AST 23  ALT 15  ALKPHOS 46  BILITOT 2.1*  BILIDIR 0.2  IBILI 1.9*   PT/INR No results found for: INR, PROTIME Hepatitis Panel No results for input(s): HEPBSAG, HCVAB, HEPAIGM, HEPBIGM in the last 72 hours. C-Diff No components found for: CDIFF Lipase     Component Value Date/Time   LIPASE 23 04/22/2020 1118    Drugs of Abuse  No results found for: LABOPIA, COCAINSCRNUR, LABBENZ, AMPHETMU, THCU, LABBARB   RADIOLOGY STUDIES: DG Neck Soft Tissue  Result Date: 04/22/2020 CLINICAL DATA:  Sore throat. EXAM: NECK SOFT TISSUES - 1+ VIEW COMPARISON:  None. FINDINGS: There is no evidence of retropharyngeal soft tissue swelling or epiglottic enlargement. The cervical airway is unremarkable and no radio-opaque foreign body identified. Mild-to-moderate cervical spondylosis is noted. The patient is edentulous. IMPRESSION: No evidence of retropharyngeal or epiglottic swelling. Electronically Signed   By: Sebastian Ache M.D.   On: 04/22/2020 07:21   DG Chest 2 View  Result Date: 04/22/2020 CLINICAL DATA:  Sore throat. EXAM: CHEST - 2 VIEW COMPARISON:  09/06/2017 FINDINGS: The cardiomediastinal silhouette is unchanged with normal heart size. The lungs are hyperinflated with advanced emphysema again noted. No confluent airspace opacity, edema, pleural effusion, pneumothorax is identified. Telemetry leads overlie the chest. No acute osseous abnormality is seen. IMPRESSION: COPD without evidence of acute cardiopulmonary process. Electronically Signed   By: Sebastian Ache M.D.   On: 04/22/2020 07:24      IMPRESSION:   *    Solid food, soft food dysphagia at level of upper cervical esophagus.  Suspicion for oral candidiasis  that may be from her new metered-dose inhaler, Trelegy.   Received 1 dose of nystatin and  is scheduled to begin Diflucan today.  *   Apparent pneumonia on a outside of hospital chest x-ray, took Levaquin for 4 days.  *    COPD.   PLAN:     *    Continue Diflucan, if dysphagia does not improve in 1 WEEK, could consider EGD at that point but not now.  Meds any time to have effect.  *    Consider Barium swallow   Jennye Moccasin  04/22/2020, 2:23 PM Phone [phone]  GI ATTENDING  History, labs, x-rays reviewed. Patient seen and examined. Agree  with comprehensive consultation note as outlined.  IMPRESSION: 1. ACUTE ODYNOPHAGIA/DYSPHAGIA. This is most like pill induced dysphagia vs. Candida.  RECOMMENDATIONS: 1. Agree with course of therapy for candidiasis 2. Viscous lidocaine to swallow before eating 3. Pain medication 4. Modified diet. Liquids and soft to be advanced as tolerated. 5. This will almost certainly be better within one week. If not, then endoscopy. She does not need to be in the hospital if she is able to take adequate PO  Will sign off, but available for questions.  Thank you.   Wilhemina Bonito. Eda Keys., M.D. [facility] Division of Gastroenterology

## 2020-04-22 NOTE — H&P (Addendum)
Date: 04/22/2020               Patient Name:  Carla Rivera MRN: [id number]  DOB: January 22, 1956 Age / Sex: 64 y.o., female   PCP: Derwood Kaplan, MD         Medical Service: Internal Medicine Teaching Service         Attending Physician: Dr. Inez Catalina, MD    First Contact: Dr. Cyndie Chime Pager: [phone]  Second Contact: Dr. Dortha Schwalbe Pager: [phone]       After Hours (After 5p/  First Contact Pager: [phone]  weekends / holidays): Second Contact Pager: [phone]   Chief Complaint: difficulty swallowing  History of Present Illness:   Carla Rivera is a 64 yo female with PMH of HTN, GERD, depression and COPD on 2L supplemental O2 presenting with worsening difficulty and pain with swallowing. Symptoms first started last Thursday when she was eating popeyes, she started to notice that swallowing was painful. This became quickly worse, and she has not had anything to eat and little to drink since then. She was able to drink a little in the ER after taking maalox and viscous lidocaine. She denies nausea, chest pain, shortness of breath, or cough. She states this feels similar to in the past when she's had thrush but is much, much worse. When she does swallow slightly larger amounts, even liquids, it seems to just reflux back up. This has never happened in the past.   She states she was seen by pulmonology recently and had been started on levaquin for findings of pneumonia on cxr. She states she never had symptoms of fever, cough, or increased shortness of breath at baseline. She did feel that the pill was difficulty to swallow. She also was switched from symbicort to trelegy at that time, around  She warecently seen s started on supplemental O2 of 2L for desaturation with ambulation. She was also switched from symbicort to trelegy on 6/30. She states she does not like this inhaler and has had difficulty trying it in the past as the powder coats her mouth when she uses despite trying to inhale  properly.   ED Course:  In the ER, HR was initially 115, with other vital signs normal, with Cr 1.2. She was given IV fluids and maalox-viscous lidocaine. This did improve her symptoms, and she was able to drink a little fluid but not much.   Social:   Smoked tobacco for 40 years, quit 6 years ago She does not drink alcohol She used to work in English as a second language teacher for [employer]  Family History:   Family History  Problem Relation Age of Onset  . Renal Disease Mother   . Hypertension Mother      Meds:  Current Meds  Medication Sig  . albuterol (PROVENTIL HFA;VENTOLIN HFA) 108 (90 Base) MCG/ACT inhaler Inhale 2 puffs into the lungs every 6 (six) hours as needed for wheezing or shortness of breath.  Marland Kitchen aspirin EC 81 MG tablet Take 81 mg by mouth daily. Swallow whole.  . budesonide-formoterol (SYMBICORT) 160-4.5 MCG/ACT inhaler Inhale 2 puffs into the lungs 2 (two) times daily.  . hydrochlorothiazide (HYDRODIURIL) 25 MG tablet Take 25 mg by mouth daily.  Marland Kitchen levofloxacin (LEVAQUIN) 500 MG tablet Take 500 mg by mouth daily. For 10 days  . solifenacin (VESICARE) 5 MG tablet Take 5 mg by mouth daily.     Allergies: Allergies as of 04/21/2020 - Review Complete 04/21/2020  Allergen Reaction Noted  .  Penicillins Rash and Other (See Comments) 12/11/2016   Past Medical History:  Diagnosis Date  . COPD (chronic obstructive pulmonary disease) (HCC)   . Depression   . HTN (hypertension)      Review of Systems: A complete ROS was negative except as per HPI.   Physical Exam: Blood pressure 110/68, pulse 79, temperature 97.8 F (36.6 C), temperature source Oral, resp. rate 16, height 5\' 4"  (1.626 m), weight 58.1 kg, SpO2 98 %.  Constitution: NAD, thin appearing, sitting up in bed  HENT: [place]/AT, MMM, white plaques on tongue, scrapable, unable to view posterior oropharynx Eyes: no icterus or injection  Cardio: tachycardic, regular rhythm, no m/r/g, no LE edema Respiratory: decreased  breath sounds in all lung fields, on 2L, non-labored breathing MSK: moving all extremities Neuro: alert and oriented, pleasant Skin: c/d/i   EKG: personally reviewed my interpretation is difficult to interpret due to artifact, reordered   CXR: personally reviewed my interpretation is hyperinflation  Assessment & Plan by Problem: Active Problems:   Esophageal thrush (HCC)  Carla Rivera is a 64 yo female with PMH of HTN, GERD, depression and COPD on 2L supplemental O2 presenting with worsening difficulty and pain with swallowing.   Esophageal Candidiasis Secondary to recent change in inhaler and difficulty with use. She has no signed of immune deficiency although is quite thin. Physical exam is consistent with esophageal candidiasis although symptoms were sudden in onset, and she is also having symptoms of reflux when trying to take in fluids, may need to rule out stricture or other esophageal injury.   - start diflucan 200 mg renally dosed, then 100 mg daily for 14 days. Can switch to po when able to tolerate  - SLP eval, soft diet for now  - discuss with GI - viscous lidocaine prn with meals  - switch trelegy back to symbicort once resolution of symptoms  - HIV  AKI Likely prerenal in setting of dysphagia. Currently 1.21, baseline .69.  - 100 dextrose 5% NS with KCl 40 mEq/L for 12 hours  HTN - hold HCTZ  COPD Currently on home O2 2L and comfortable.   - hold trelegy, switch back to symbicort with resolution of symptoms  - duoneb q4h prn  Recent Pneumonia Not currently symptomatic and chest x-ray clear. Unable to see previous chest xray. No abx indicated.   Diet: soft diet VTE: lovenox IVF: 100 cc/hr d5ns Code: full   Dispo: Admit patient to Observation with expected length of stay less than 2 midnights.  Signed76, DO 04/22/2020, 2:04 PM  Pager: [phone]

## 2020-04-22 NOTE — ED Provider Notes (Signed)
On reevaluation patient is still not able to tolerate p.o.'s.  She did tolerate to ice chips but states when she tried to drink some water she vomited it back up.  The nystatin and GI cocktail did help some and she was able to hold that down.  Fear that patient will not be able to remain hydrated given her new AKI and minimally elevated sodium.  Suspect this is secondary to thrush.  Will admit for further care.   Gwyneth Sprout, MD 04/22/20 1024

## 2020-04-22 NOTE — ED Notes (Signed)
Pt called out, IV was hurting.  RN looked at IV, does appear to be causing pt some swelling. RN attempted to start new IV however was unable to find any access.

## 2020-04-22 NOTE — ED Notes (Signed)
Patient transported to X-ray 

## 2020-04-23 DIAGNOSIS — N179 Acute kidney failure, unspecified: Secondary | ICD-10-CM

## 2020-04-23 DIAGNOSIS — E860 Dehydration: Secondary | ICD-10-CM

## 2020-04-23 DIAGNOSIS — R1310 Dysphagia, unspecified: Secondary | ICD-10-CM

## 2020-04-23 LAB — BASIC METABOLIC PANEL
Anion gap: 10 (ref 5–15)
BUN: 19 mg/dL (ref 8–23)
CO2: 26 mmol/L (ref 22–32)
Calcium: 8.6 mg/dL — ABNORMAL LOW (ref 8.9–10.3)
Chloride: 108 mmol/L (ref 98–111)
Creatinine, Ser: 0.79 mg/dL (ref 0.44–1.00)
GFR calc Af Amer: >60 mL/min (ref >60)
GFR calc non Af Amer: >60 mL/min (ref >60)
Glucose, Bld: 165 mg/dL — ABNORMAL HIGH (ref 70–99)
Potassium: 3.1 mmol/L — ABNORMAL LOW (ref 3.5–5.1)
Sodium: 144 mmol/L (ref 135–145)

## 2020-04-23 LAB — COMPREHENSIVE METABOLIC PANEL
ALT: 15 U/L (ref 0–44)
AST: 22 U/L (ref 15–41)
Albumin: 3.5 g/dL (ref 3.5–5.0)
Alkaline Phosphatase: 40 U/L (ref 38–126)
Anion gap: 11 (ref 5–15)
BUN: 24 mg/dL — ABNORMAL HIGH (ref 8–23)
CO2: 29 mmol/L (ref 22–32)
Calcium: 9.1 mg/dL (ref 8.9–10.3)
Chloride: 112 mmol/L — ABNORMAL HIGH (ref 98–111)
Creatinine, Ser: 0.81 mg/dL (ref 0.44–1.00)
GFR calc Af Amer: >60 mL/min (ref >60)
GFR calc non Af Amer: >60 mL/min (ref >60)
Glucose, Bld: 104 mg/dL — ABNORMAL HIGH (ref 70–99)
Potassium: 3.5 mmol/L (ref 3.5–5.1)
Sodium: 152 mmol/L — ABNORMAL HIGH (ref 135–145)
Total Bilirubin: 1.3 mg/dL — ABNORMAL HIGH (ref 0.3–1.2)
Total Protein: 6.6 g/dL (ref 6.5–8.1)

## 2020-04-23 LAB — CBC
HCT: 40.3 % (ref 36.0–46.0)
Hemoglobin: 12.2 g/dL (ref 12.0–15.0)
MCH: 27.6 pg (ref 26.0–34.0)
MCHC: 30.3 g/dL (ref 30.0–36.0)
MCV: 91.2 fL (ref 80.0–100.0)
Platelets: 253 10*3/uL (ref 150–400)
RBC: 4.42 MIL/uL (ref 3.87–5.11)
RDW: 14.3 % (ref 11.5–15.5)
WBC: 5.4 10*3/uL (ref 4.0–10.5)
nRBC: 0 % (ref 0.0–0.2)

## 2020-04-23 LAB — HIV ANTIBODY (ROUTINE TESTING W REFLEX): HIV Screen 4th Generation wRfx: NONREACTIVE

## 2020-04-23 MED ORDER — LIDOCAINE VISCOUS HCL 2 % MT SOLN: 15.0000 mL | OROMUCOSAL | 6 refills | Status: AC | PRN

## 2020-04-23 MED ORDER — FLUCONAZOLE 100 MG PO TABS
100.0000 mg | ORAL_TABLET | Freq: Every day | ORAL | 0 refills | Status: AC
Start: 2020-04-23 — End: 2020-05-05

## 2020-04-23 MED ORDER — POTASSIUM CHLORIDE 20 MEQ PO PACK
40.0000 meq | PACK | Freq: Once | ORAL | Status: AC
  Administered 2020-04-23: 40 meq via ORAL
  Filled 2020-04-23: qty 2

## 2020-04-23 MED ORDER — DEXTROSE 5 % IV SOLN: INTRAVENOUS | Status: AC

## 2020-04-23 MED ORDER — LIDOCAINE VISCOUS HCL 2 % MT SOLN
15.0000 mL | OROMUCOSAL | Status: DC | PRN
  Filled 2020-04-23: qty 15

## 2020-04-23 NOTE — Progress Notes (Signed)
Iqra Rotundo [id number] Admission Data: 04/23/2020 7:30 AM Attending Provider: Inez Catalina, MD  OXB:DZHGD, Serita Sheller, MD Consults/ Treatment Team:   Estelle Greenleaf is a 64 y.o. female patient admitted from ED awake, alert  & orientated  X 3,  Full Code, VSS - Blood pressure 124/63, pulse 64, temperature 98.4 F (36.9 C), temperature source Oral, resp. rate 16, height 5\' 4"  (1.626 m), weight 58.1 kg, SpO2 99 %., O2    2 L nasal cannular, no c/o shortness of breath, no c/o chest pain, no distress noted.  IV site WDL:  forearm right, condition patent and no redness with a transparent dsg that's clean dry and intact.  Allergies:   Allergies  Allergen Reactions  . Penicillins Rash and Other (See Comments)    Has patient had a PCN reaction causing immediate rash, facial/tongue/throat swelling, SOB or lightheadedness with hypotension: No Has patient had a PCN reaction causing severe rash involving mucus membranes or skin necrosis: No Has patient had a PCN reaction that required hospitalization No Has patient had a PCN reaction occurring within the last 10 years: No If all of the above answers are "NO", then may proceed with Cephalosporin use.      Past Medical History:  Diagnosis Date  . COPD (chronic obstructive pulmonary disease) (HCC)   . Depression   . HTN (hypertension)     History:  obtained from chart review. Tobacco/alcohol: denied none  Pt orientation to unit, room and routine. Information packet given to patient/family and safety video watched.  Admission INP armband ID verified with patient/family, and in place. SR up x 2, fall risk assessment complete with Patient and family verbalizing understanding of risks associated with falls. Pt verbalizes an understanding of how to use the call bell and to call for help before getting out of bed.  Skin, clean-dry- intact without evidence of bruising, or skin tears.   No evidence of skin break down noted on exam. no rashes, no  ecchymoses, no petechiae, no nodules, no jaundice, no purpura, no wounds    Will cont to monitor and assist as needed.  Renaldo Reel, MSN, RN, CMSRN, AGCNS  04/23/2020 7:30 AM

## 2020-04-23 NOTE — Hospital Course (Signed)
Esophageal Candidiasis Patient present to the hospital for worsening dysphagia and odynophagia. Her symptoms are consistent with esophageal candidiasis secondary to new powder inhaler Trelegy used. Dysphagia and Odynophagia improve after started on IV fluconazole and Viscous Lidocaine. Patient is able to swallow fluid and soft food. GI was consulted and recommended continue treatment with Fluconazole. EGD is not indicated at this time. Will consider EGD if symptoms don't improve after 1 week. Patient is going home and continue taking Fluconazole PO. Pending HIV test.      Hypernatremia Hyperchloremia  Patient receive 1L bolus of NS in the ED. Na 152, Chlo 112. Calculated free water deficit 2.2 L. Dextrose 5% 150 cc/h was given for 8h. Recheck sodium 144 and chloride 108.      AKI AKI likely in the setting of dehydration and resolved after IVF.

## 2020-04-23 NOTE — Discharge Summary (Signed)
Name: Carla Rivera MRN: [id number] DOB: [birth year] 64 y.o. PCP: Derwood Kaplan, MD  Date of Admission: 04/21/2020  5:17 PM Date of Discharge:  Attending Physician: Inez Catalina, MD  Discharge Diagnosis: 1. Esophageal candidiasis 2. AKI  Discharge Medications: Allergies as of 04/23/2020      Reactions   Penicillins Rash, Other (See Comments)   Has patient had a PCN reaction causing immediate rash, facial/tongue/throat swelling, SOB or lightheadedness with hypotension: No Has patient had a PCN reaction causing severe rash involving mucus membranes or skin necrosis: No Has patient had a PCN reaction that required hospitalization No Has patient had a PCN reaction occurring within the last 10 years: No If all of the above answers are "NO", then may proceed with Cephalosporin use.      Medication List    STOP taking these medications   ipratropium-albuterol 0.5-2.5 (3) MG/3ML Soln Commonly known as: DUONEB   levofloxacin 500 MG tablet Commonly known as: LEVAQUIN     TAKE these medications   albuterol 108 (90 Base) MCG/ACT inhaler Commonly known as: VENTOLIN HFA Inhale 2 puffs into the lungs every 6 (six) hours as needed for wheezing or shortness of breath.   aspirin EC 81 MG tablet Take 81 mg by mouth daily. Swallow whole.   budesonide-formoterol 160-4.5 MCG/ACT inhaler Commonly known as: Symbicort Inhale 2 puffs into the lungs 2 (two) times daily.   fluconazole 100 MG tablet Commonly known as: Diflucan Take 1 tablet (100 mg total) by mouth daily for 12 days.   hydrochlorothiazide 25 MG tablet Commonly known as: HYDRODIURIL Take 25 mg by mouth daily.   lidocaine 2 % solution Commonly known as: XYLOCAINE Use as directed 15 mLs in the mouth or throat every 3 (three) hours as needed for up to 14 days for mouth pain (Before eating).   nystatin 100000 UNIT/ML suspension Commonly known as: MYCOSTATIN Take 5 mLs (500,000 Units total) by mouth 4 (four) times  daily.   solifenacin 5 MG tablet Commonly known as: VESICARE Take 5 mg by mouth daily.       Disposition and follow-up:   CarlaCarla Rivera was discharged from [facility] in Stable condition.  At the hospital follow up visit please address:  1.  Esophageal candidiasis - Please follow up on symptom improvement - If no improvement or worsen, please consider GI referral for EGD  2.  Labs / imaging needed at time of follow-up: none  3.  Pending labs/ test needing follow-up: none  Follow-up Appointments:  Follow-up Information    [facility]. Schedule an appointment as soon as possible for a visit in 1 week(s).   Specialty: Gastroenterology Contact information: [address] [place] [address] [phone]              Hospital Course by problem list: 1. Esophageal Candidiasis Patient present to the hospital for worsening dysphagia and odynophagia. Her symptoms are consistent with esophageal candidiasis secondary to new powder inhaler Trelegy used. Dysphagia and Odynophagia improve after started on IV fluconazole and Viscous Lidocaine. Patient is able to swallow fluid and soft food. GI was consulted and recommended continue treatment with Fluconazole. EGD is not indicated at this time. Will consider EGD if symptoms don't improve after 1 week. Patient is going home and continue taking Fluconazole PO. Pending HIV test.      Hypernatremia Hyperchloremia  Patient receive 1L bolus of NS in the ED. Na 152, Chlo 112. Calculated free water deficit 2.2  L. Dextrose 5% 150 cc/h was given for 8h. Recheck sodium 144 and chloride 108.      AKI AKI likely in the setting of dehydration and resolved after IVF.    Discharge Vitals:   BP 106/65 (BP Location: Right Arm)   Pulse 71   Temp 98.2 F (36.8 C) (Oral)   Resp 16   Ht 5\' 4"  (1.626 m)   Wt 58.1 kg   SpO2 99%   BMI 21.99 kg/m   Pertinent Labs, Studies, and Procedures:    CBC Latest Ref Rng & Units 04/23/2020 04/21/2020 09/06/2017  WBC 4.0 - 10.5 K/uL 5.4 5.9 3.5(L)  Hemoglobin 12.0 - 15.0 g/dL 14/01/2017 25.6 38.9  Hematocrit 36 - 46 % 40.3 46.5(H) 45.7  Platelets 150 - 400 K/uL 253 328 274   BMP Latest Ref Rng & Units 04/23/2020 04/23/2020 04/21/2020  Glucose 70 - 99 mg/dL 04/23/2020) 428(J) 681(L)  BUN 8 - 23 mg/dL 19 572(I) 20(B)  Creatinine 0.44 - 1.00 mg/dL 55(H 7.41 6.38)  Sodium 135 - 145 mmol/L 144 152(H) 146(H)  Potassium 3.5 - 5.1 mmol/L 3.1(L) 3.5 3.4(L)  Chloride 98 - 111 mmol/L 108 112(H) 103  CO2 22 - 32 mmol/L 26 29 25   Calcium 8.9 - 10.3 mg/dL 4.53(M) 9.1 9.9   DG Neck Soft Tissue  Result Date: 04/22/2020 CLINICAL DATA:  Sore throat. EXAM: NECK SOFT TISSUES - 1+ VIEW COMPARISON:  None. FINDINGS: There is no evidence of retropharyngeal soft tissue swelling or epiglottic enlargement. The cervical airway is unremarkable and no radio-opaque foreign body identified. Mild-to-moderate cervical spondylosis is noted. The patient is edentulous. IMPRESSION: No evidence of retropharyngeal or epiglottic swelling. Electronically Signed   By: 4.6(O M.D.   On: 04/22/2020 07:21   DG Chest 2 View  Result Date: 04/22/2020 CLINICAL DATA:  Sore throat. EXAM: CHEST - 2 VIEW COMPARISON:  09/06/2017 FINDINGS: The cardiomediastinal silhouette is unchanged with normal heart size. The lungs are hyperinflated with advanced emphysema again noted. No confluent airspace opacity, edema, pleural effusion, pneumothorax is identified. Telemetry leads overlie the chest. No acute osseous abnormality is seen. IMPRESSION: COPD without evidence of acute cardiopulmonary process. Electronically Signed   By: 04/24/2020 M.D.   On: 04/22/2020 07:24   Discharge Instructions: Discharge Instructions    Discharge instructions   Complete by: As directed    Carla Rivera,   It was a pleasure taking care of you here in the hospital. You were admitted because of irritation of your food pipe. We  started you on medications. Here are my recommendations after your hospital visit:  1) Take fluconazole 100mg  daily for 12 more days 2) Eat only soft diet such as mashed potatoes for now  3) Use Lidocaine mouth wash before you eat 4) STOP the Trelegy and use Symbicort  5) Follow up with the Stomach doctor in a week after discharge.  6) STOP The Hydrochlorothiazide for TOMORROW  AND SATURDAY. Resume on Sunday and follow up with your primary doctor to check Labs (BMP).   Take care!   Increase activity slowly   Complete by: As directed       Signed: Toni Arthurs, DO 04/23/2020, 3:34 PM   Pager: [phone]

## 2020-04-23 NOTE — ED Notes (Signed)
Breakfast Ordered--Carla Rivera  

## 2020-04-23 NOTE — Evaluation (Signed)
Clinical/Bedside Swallow Evaluation Patient Details  Name: Carla Rivera MRN: [id number] Date of Birth: Dec 09, 1955  Today's Date: 04/23/2020 Time: SLP Start Time (ACUTE ONLY): 1008 SLP Stop Time (ACUTE ONLY): 1020 SLP Time Calculation (min) (ACUTE ONLY): 12 min  Past Medical History:  Past Medical History:  Diagnosis Date  . COPD (chronic obstructive pulmonary disease) (HCC)   . Depression   . HTN (hypertension)    Past Surgical History:  Past Surgical History:  Procedure Laterality Date  . ABDOMINAL HYSTERECTOMY    . ECTOPIC PREGNANCY SURGERY     HPI:  Ms. Urbanowicz is a 64 year old woman with COPD on home oxygen, HTN, GERD who presented with odynophagia and dysphagia.  She has had thrush in the past and this is similar, but worse, as she has not been able to keep most things down.  She was recently switched to Trelegy.  She notes difficulty with the powder formulation and feeling like it was left in her mouth despite attempting to inhale the powder.   Assessment / Plan / Recommendation Clinical Impression  Pt demosntrates much improved ability to swallow per report in comparison with admission. She has meal tray in front of her and was observed with thin liquids, pudding and dry pancake without any difficulty transiting solids. No report of globus, no signs of aspiration. Pt verbalized a good understanding of appropriate food textures: soft moist. SLP reinforced the use of a liquid wash. Pt has not wanted to put her dentures back in until "her mouth was right" but expect once she is comfortable, she will not have trouble with meats, etc. Pt is consuming as adequate amount of PO and is using common sense appropriately. No SLP f/u or MBS needed.  SLP Visit Diagnosis: Dysphagia, unspecified (R13.10)    Aspiration Risk  Mild aspiration risk    Diet Recommendation Dysphagia 3 (Mech soft);Thin liquid   Liquid Administration via: Cup;Straw Medication Administration: Whole meds with  liquid Supervision: Patient able to self feed Compensations: Slow rate;Small sips/bites;Follow solids with liquid Postural Changes: Seated upright at 90 degrees    Other  Recommendations Oral Care Recommendations: Oral care BID   Follow up Recommendations None      Frequency and Duration            Prognosis        Swallow Study   General HPI: Ms. Bath is a 64 year old woman with COPD on home oxygen, HTN, GERD who presented with odynophagia and dysphagia.  She has had thrush in the past and this is similar, but worse, as she has not been able to keep most things down.  She was recently switched to Trelegy.  She notes difficulty with the powder formulation and feeling like it was left in her mouth despite attempting to inhale the powder. Type of Study: Bedside Swallow Evaluation Previous Swallow Assessment: none Diet Prior to this Study: Dysphagia 3 (soft);Thin liquids Temperature Spikes Noted: No Respiratory Status: Room air History of Recent Intubation: No Behavior/Cognition: Alert;Cooperative;Pleasant mood Oral Cavity Assessment: Within Functional Limits Oral Care Completed by SLP: No Oral Cavity - Dentition: Edentulous;Dentures, top;Dentures, bottom (in cup) Vision: Functional for self-feeding Self-Feeding Abilities: Able to feed self Patient Positioning: Upright in bed Baseline Vocal Quality: Normal Volitional Cough: Strong Volitional Swallow: Able to elicit    Oral/Motor/Sensory Function     Ice Chips     Thin Liquid Thin Liquid: Within functional limits Presentation: Straw    Nectar Thick Nectar Thick Liquid: Not tested  Honey Thick Honey Thick Liquid: Not tested   Puree Puree: Within functional limits Presentation: Spoon   Solid     Solid: Within functional limits Presentation: Self Fed     Harlon Ditty, MA CCC-SLP  Acute Rehabilitation Services Pager [phone] Office [phone]  Claudine Mouton 04/23/2020,10:24 AM

## 2020-04-23 NOTE — Care Management Obs Status (Signed)
MEDICARE OBSERVATION STATUS NOTIFICATION   Patient Details  Name: Carla Rivera MRN: [id number] Date of Birth: [birth year]   Medicare Observation Status Notification Given:  Yes  spoke to patient over the phone, patient gave permission to sign form, sent copy to nursing station to give to patient.   Lockie Pares, RN 04/23/2020, 12:12 PM

## 2020-04-23 NOTE — Progress Notes (Signed)
Nancee Liter to be D/C'd Home per MD order.  Discussed with the patient and all questions fully answered.  VSS, Skin clean, dry and intact without evidence of skin break down, no evidence of skin tears noted. IV catheter discontinued intact. Site without signs and symptoms of complications. Dressing and pressure applied.  An After Visit Summary was printed and given to the patient. Patient received prescription.  D/c education completed with patient/family including follow up instructions, medication list, d/c activities limitations if indicated, with other d/c instructions as indicated by MD - patient able to verbalize understanding, all questions fully answered.   Patient instructed to return to ED, call 911, or call MD for any changes in condition.   Patient escorted via WC, and D/C home via private auto.  Rosemarie Beath Tykeshia Tourangeau 04/23/2020 1545

## 2020-04-23 NOTE — Progress Notes (Signed)
Subjective:   Hospital day: 1  Overnight event: No  Patient is a 64 yo F with PMH of HTN, GERD, COPD on 2L at home, present to the hospital for worsening dysphagia and odynophagia. This is likely esophageal candidiasis secondary to new powder inhaler used. Patient is seen at bedside today and appear in no acute distress. She states that her odynophagia has improved compared to yesterday. She was able to swallow fluid and soft food like mash potato. She denies abdominal pain or chest pain.   Objective:  Vital signs in last 24 hours: Vitals:   04/23/20 0300 04/23/20 0605 04/23/20 0652 04/23/20 0726  BP: 130/81 112/66 117/70 124/63  Pulse: 86 66 62 64  Resp:  16 16   Temp:  98.7 F (37.1 C) 98.5 F (36.9 C) 98.4 F (36.9 C)  TempSrc:  Oral Oral Oral  SpO2: 100% 100% 100% 99%  Weight:      Height:        Physical Exam  Physical Exam Constitutional:      General: She is not in acute distress.    Appearance: She is not toxic-appearing.  HENT:     Head: Normocephalic.  Eyes:     General: No scleral icterus.       Right eye: No discharge.        Left eye: No discharge.     Conjunctiva/sclera: Conjunctivae normal.  Cardiovascular:     Rate and Rhythm: Normal rate and regular rhythm.     Heart sounds: Normal heart sounds. No murmur heard.   Pulmonary:     Effort: Pulmonary effort is normal. No respiratory distress.  Abdominal:     General: Bowel sounds are normal.     Tenderness: There is no abdominal tenderness.  Musculoskeletal:     Cervical back: Normal range of motion.     Right lower leg: No edema.     Left lower leg: No edema.  Skin:    General: Skin is warm.     Coloration: Skin is not jaundiced.  Neurological:     Mental Status: She is alert.  Psychiatric:        Mood and Affect: Mood normal.        Thought Content: Thought content normal.     Assessment/Plan: Patient is a 64 yo F with PMH of HTN, GERD, COPD on 2L at home, present to the hospital for  worsening dysphagia and odynophagia. This is likely esophageal candidiasis secondary to new powder inhaler used. IV Diflucan was started.  Active Problems:   Esophageal thrush (HCC)   Esophageal Candidiasis Esophageal candidiasis secondary to new powder inhaler used. Pending HIV test. Dysphagia and Odynophagia improve. Patient is able to swallow fluid and soft food. GI was consulted and recommended continue treatment with Fluconazole. EGD is not indicated at this time. Will consider EGD if symptoms don't improve after 1 week  - Continue soft diet - Vicous Lidocaine for pain  - Will consider transit to PO Fluconazole in PM if patient does well PO at lunch. Continue IV fluconazole.  - Switch trelegy back to symbicort once resolution of symptoms  - Pending HIV   Hypernatremia Hyperchloremia  Patient receive 1L bolus of NS in the ED. Na 152, Chlo 112. Calculated free water deficit 2.2 L. - Dextrose 5% 150 cc/h  - Recheck BMP at noon    AKI- resolved Likely prerenal in setting of dysphagia. Baseline .69. - 100 dextrose 5% NS with KCl 40 mEq/L for  12 hours   HTN - hold HCTZ   COPD Currently on home O2 2L and comfortable.  - hold trelegy, switch back to symbicort with resolution of symptoms  - duoneb q4h prn  Diet: soft diet IVF: D5W  VTE: Lovenox CODE: Full  Prior to Admission Living Arrangement: home Anticipated Discharge Location: home Barriers to Discharge: dysphagia Dispo: Anticipated discharge in approximately 1 day(s).   Doran Stabler, DO 04/23/2020, 9:38 AM Pager: [phone] After 5pm on weekdays and 1pm on weekends: On Call pager [phone]

## 2020-04-23 NOTE — Progress Notes (Signed)
  Date: 04/23/2020  Patient name: Carla Rivera  Medical record number: [id number]  Date of birth: 09-May-1956   I have seen and evaluated Nancee Liter and discussed their care with the Residency Team. Briefly, Ms. Stinger is a 64 year old woman with COPD on home oxygen, HTN, GERD who presented with odynophagia and dysphagia.  She has had thrush in the past and this is similar, but worse, as she has not been able to keep most things down.  She was recently switched to Trelegy.  She notes difficulty with the powder formulation and feeling like it was left in her mouth despite attempting to inhale the powder.  PMHx, Fam Hx, and/or Soc Hx : She is a previous smoker, cessation 6 years ago.  She has a FH of renal disease and HTN.   Vitals:   04/23/20 0652 04/23/20 0726  BP: 117/70 124/63  Pulse: 62 64  Resp: 16   Temp: 98.5 F (36.9 C) 98.4 F (36.9 C)  SpO2: 100% 99%   General: Thin woman, sitting up in bed, reports to feel dizzy Eyes. Anicteric sclerae, no conjunctival injection HENT: Neck is supple, whitish plaques in mouth, [place] in place CV: RR, NR, no murmur, no pedal edema Pulm: Decreased breath sounds throughout.  Increased I:E ratio, barrel chest Abd: Soft, +BS, NT MSK: Thin extremities, decreased bulk to muscles, no contractures noted Skin: Dry and clear, no wounds on exposed skin Psych: Pleasant, alert  Assessment and Plan: I have seen and evaluated the patient as outlined above. I agree with the formulated Assessment and Plan as detailed in the residents' note, with the following changes:   1. Esophageal and oral thrush - Fluconazole - GI consult noted, can change to PO fluconazole when she is able to swallow - Trial of viscous lidocaine with food/water - Consider nutrition consult if needed  2. AKI - IVF as noted, trend BMET - Appears renal function is improved today, however now with hypernatremia worsening and hyperchloremia.  Switch to D5W for fluids.    Other  issues per Dr. Weyman Rodney note.   Inez Catalina, MD 7/22/20218:07 AM

## 2020-06-24 ENCOUNTER — Encounter: Payer: Self-pay | Admitting: Family Medicine

## 2020-11-06 IMAGING — CR DG NECK SOFT TISSUE
2 series · 2 of 2 positions shown · non-contrast
Comparison: None.

CLINICAL DATA: Sore throat.

EXAM:
NECK SOFT TISSUES - 1+ VIEW

[neck lat]
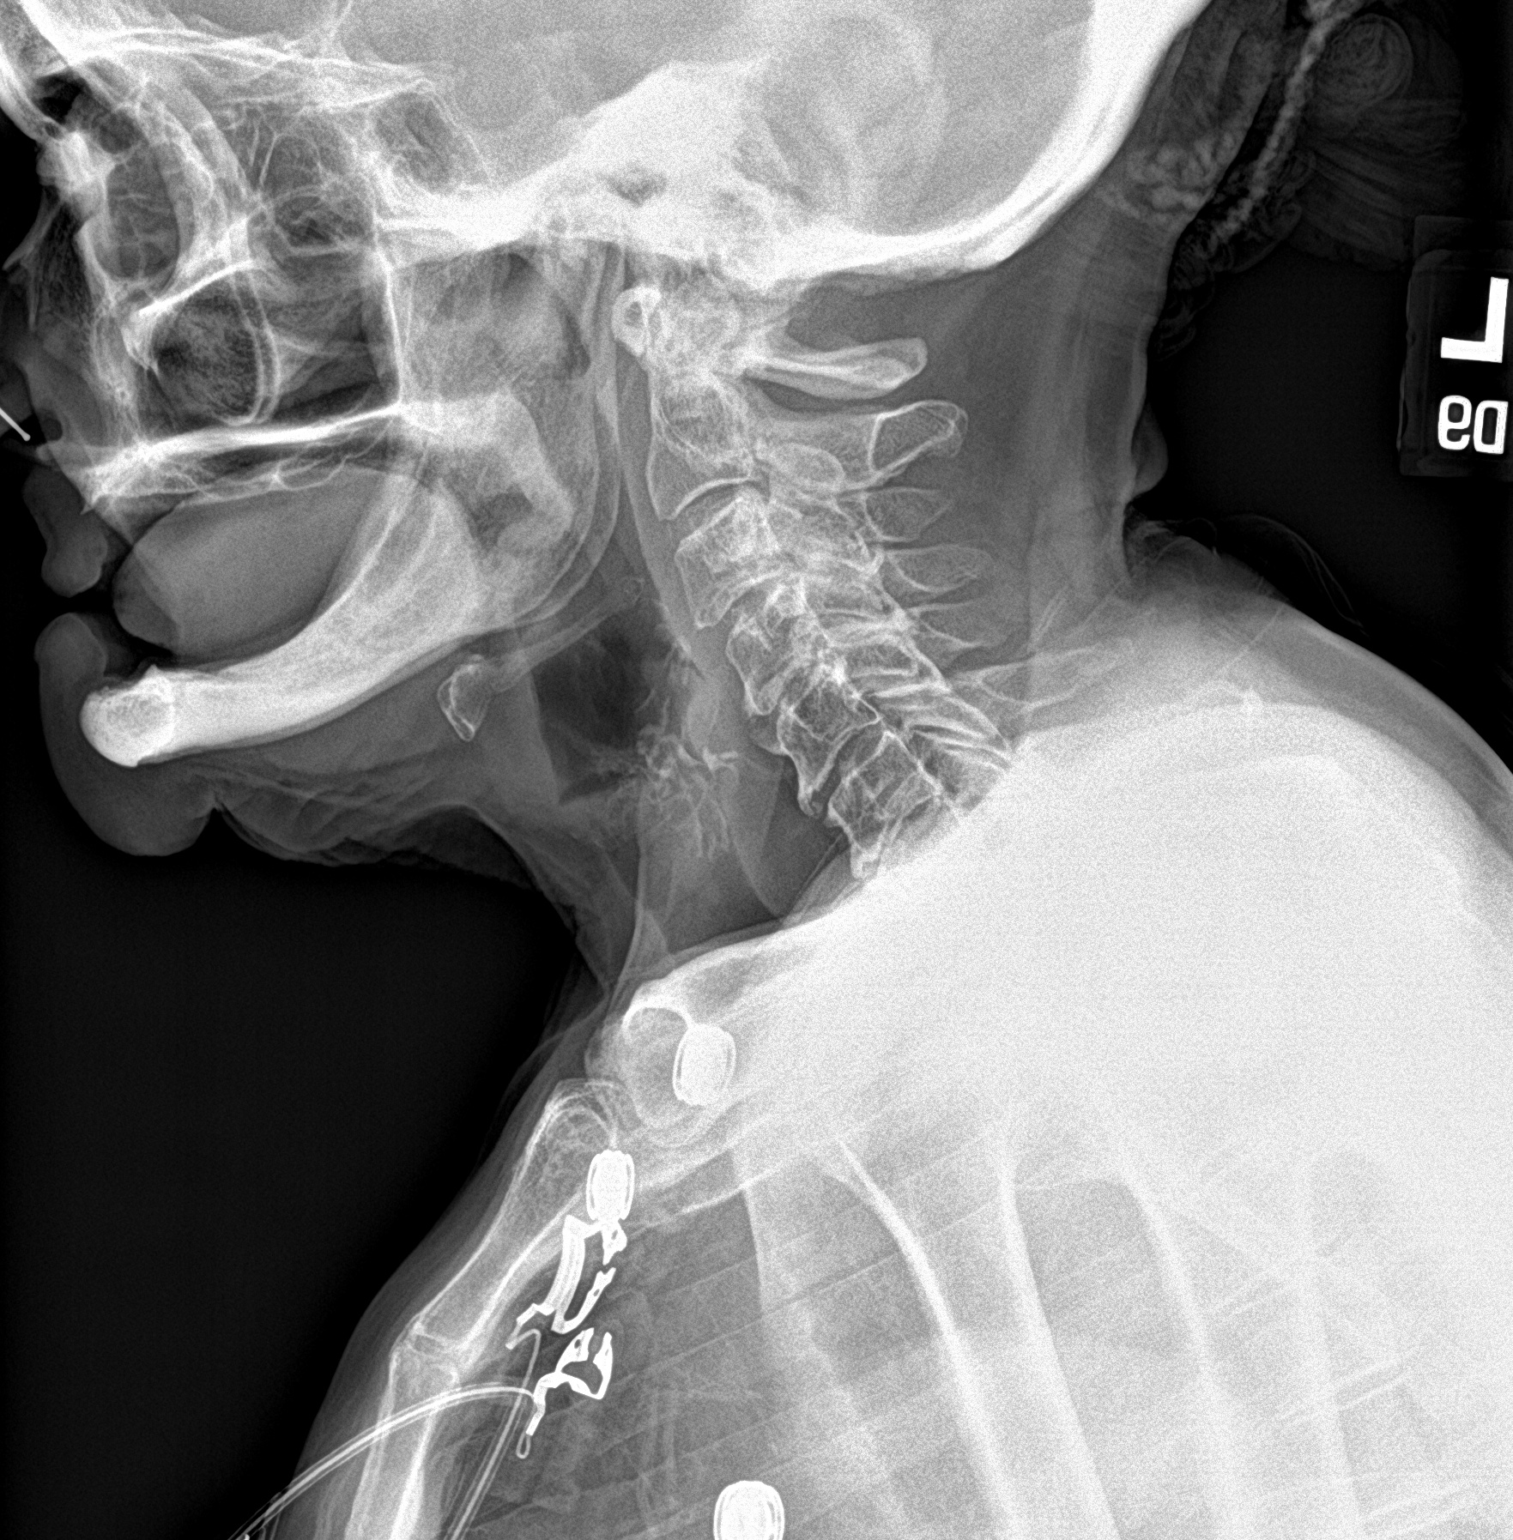

[neck ap]
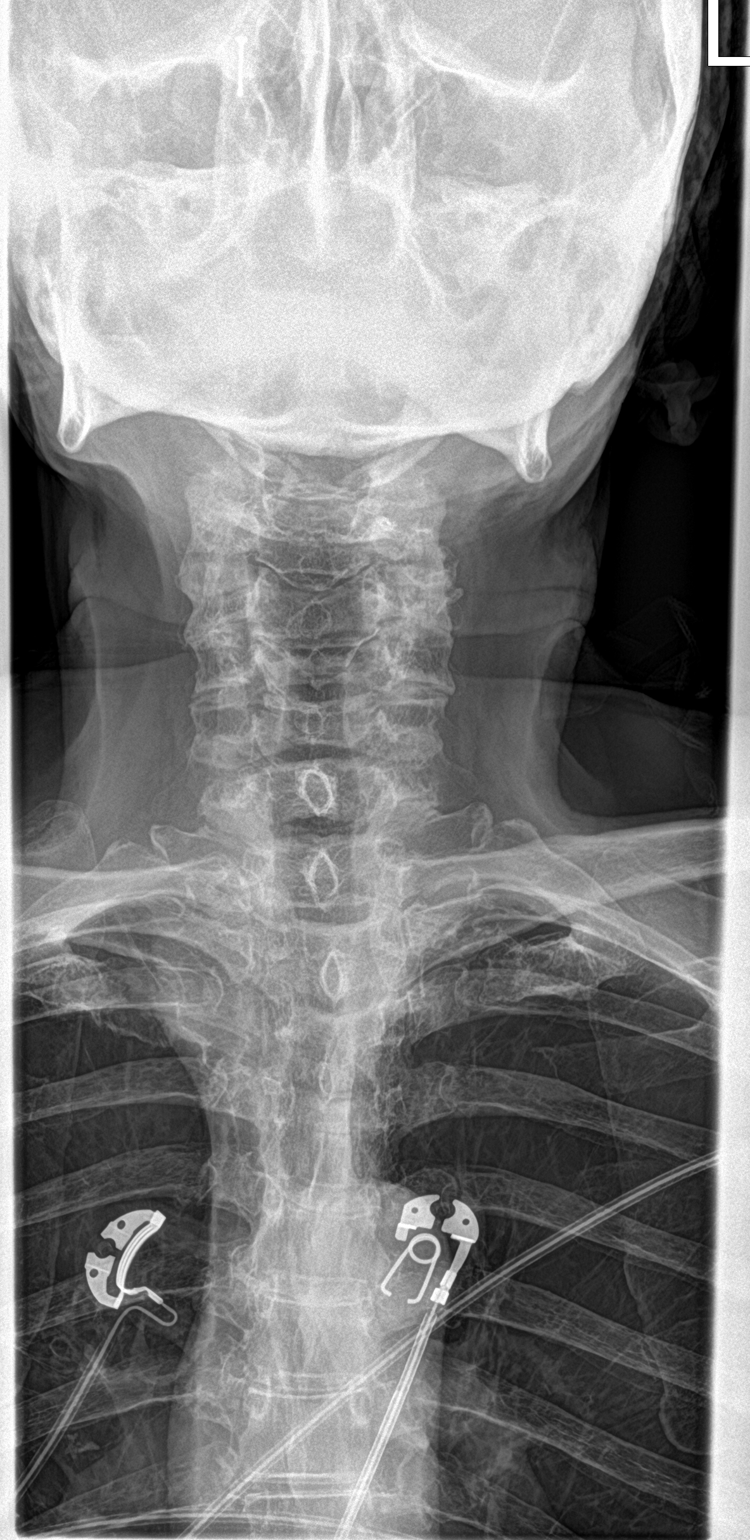

[2 of 2 positions shown; findings below may reference images not displayed]

FINDINGS: There is no evidence of retropharyngeal soft tissue swelling or
epiglottic enlargement. The cervical airway is unremarkable and no
radio-opaque foreign body identified. Mild-to-moderate cervical
spondylosis is noted. The patient is edentulous.
IMPRESSION: No evidence of retropharyngeal or epiglottic swelling.

## 2020-11-06 IMAGING — CR DG CHEST 2V
1 series · 1 of 1 positions shown · non-contrast
Comparison: 09/06/2017

CLINICAL DATA: Sore throat.

EXAM:
CHEST - 2 VIEW

[chest lat]
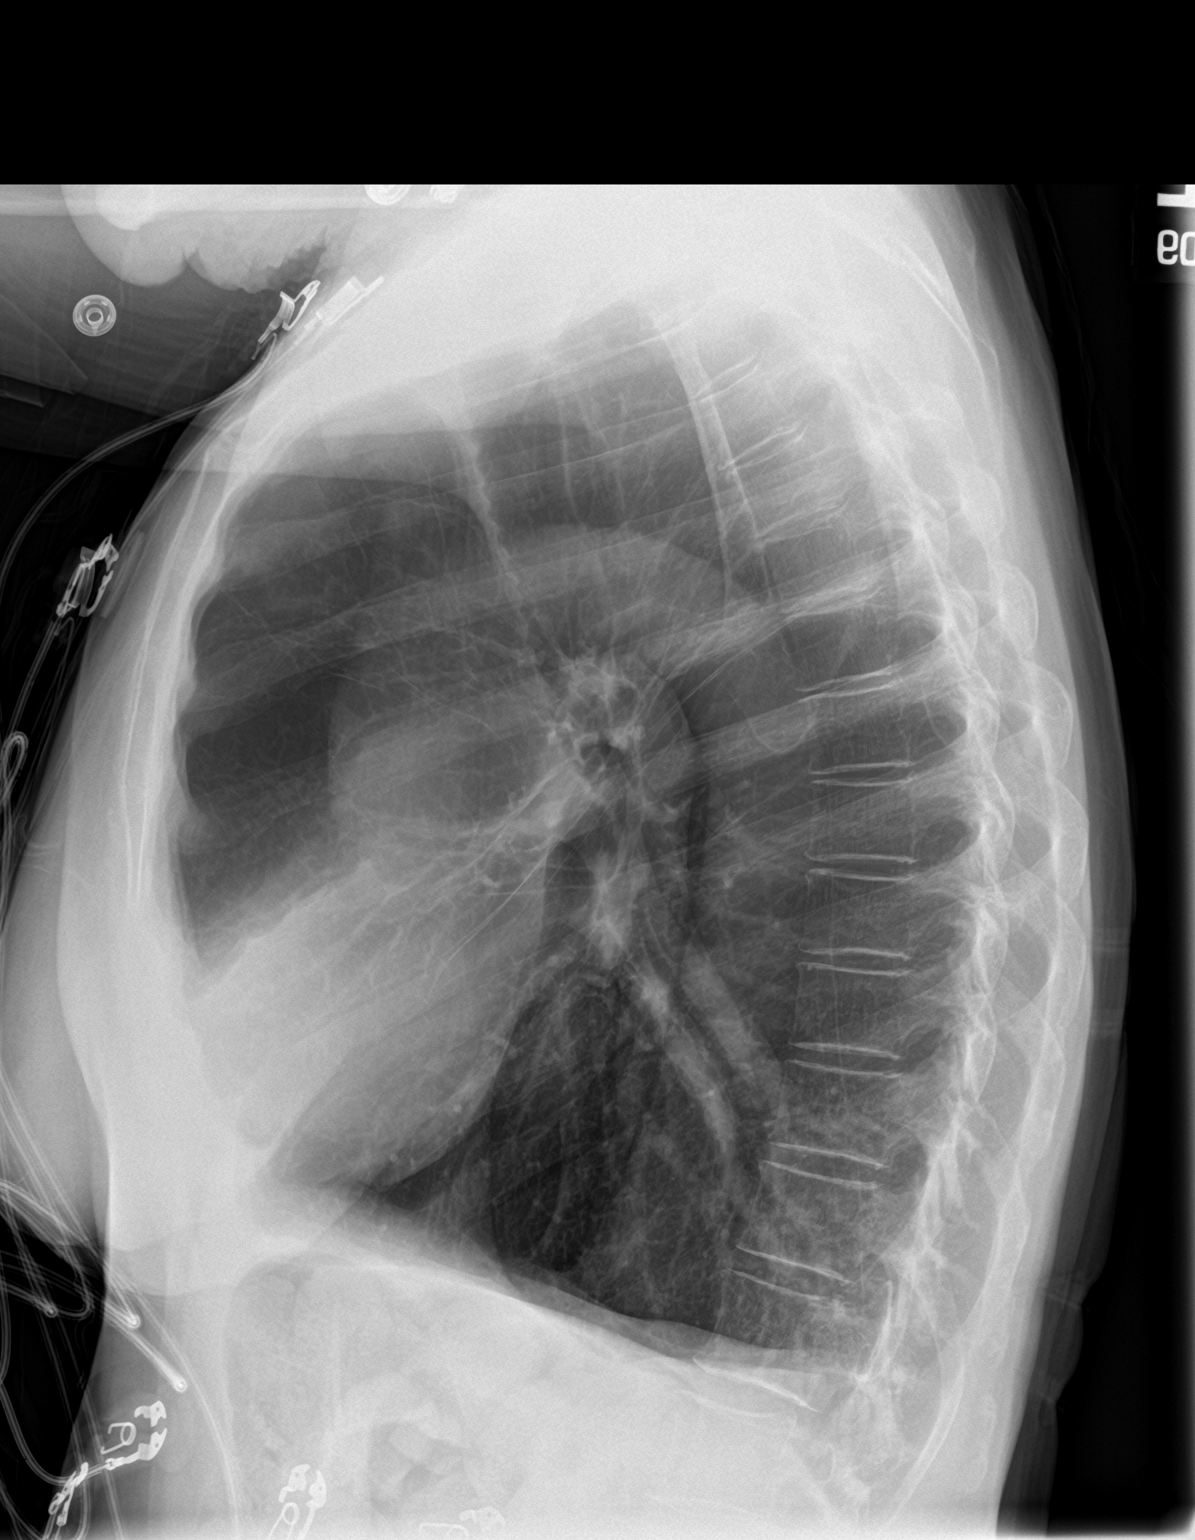

[1 of 1 positions shown; findings below may reference images not displayed]

FINDINGS: The cardiomediastinal silhouette is unchanged with normal heart
size. The lungs are hyperinflated with advanced emphysema again
noted. No confluent airspace opacity, edema, pleural effusion,
pneumothorax is identified. Telemetry leads overlie the chest. No
acute osseous abnormality is seen.
IMPRESSION: COPD without evidence of acute cardiopulmonary process.

## 2021-01-18 ENCOUNTER — Encounter: Payer: Self-pay | Admitting: Cardiology

## 2021-01-18 ENCOUNTER — Ambulatory Visit: Payer: Medicare PPO | Admitting: Cardiology

## 2021-01-18 ENCOUNTER — Other Ambulatory Visit: Payer: Self-pay

## 2021-01-18 VITALS — BP 120/62 | HR 87 | Ht 60.0 in | Wt 102.0 lb

## 2021-01-18 DIAGNOSIS — I10 Essential (primary) hypertension: Secondary | ICD-10-CM | POA: Diagnosis not present

## 2021-01-18 DIAGNOSIS — R600 Localized edema: Secondary | ICD-10-CM

## 2021-01-18 NOTE — Progress Notes (Signed)
Cardiology Office Note:    Date:  01/18/2021   ID:  Nancee Liter, DOB [birth year], MRN [id number]  PCP:  Derwood Kaplan, MD   [facility]  Cardiologist:  Debbe Odea, MD  Advanced Practice Provider:  No care team member to display Electrophysiologist:  None       Referring MD: Derwood Kaplan, MD   Chief Complaint  Patient presents with  . New Patient (Initial Visit)    Referred by PCP for BLE Edema. Meds reviewed verbally with patient.     History of Present Illness:    Carla Rivera is a 65 y.o. female with a hx of hypertension, former smoker x20 years, COPD on 2 L home oxygen, who presents due to lower extremity edema.  Patient was diagnosed with COVID 19 about 3 months ago.  Had symptoms of cough and shortness of breath.  She was given ritonavir/lopinavir x5 days.  She developed left lower extremity edema after this.  States having lower extremity ultrasound in [facility] without any DVT.  She was placed on diuretics, HCTZ was increased.  Edema improved, now only trace levels.  She denies chest pain, denies any history of heart disease.  Has chronic shortness of breath due to COPD.  Past Medical History:  Diagnosis Date  . COPD (chronic obstructive pulmonary disease) (HCC)   . Depression   . HTN (hypertension)     Past Surgical History:  Procedure Laterality Date  . ABDOMINAL HYSTERECTOMY    . ECTOPIC PREGNANCY SURGERY      Current Medications: Current Meds  Medication Sig  . albuterol (PROVENTIL HFA;VENTOLIN HFA) 108 (90 Base) MCG/ACT inhaler Inhale 2 puffs into the lungs every 6 (six) hours as needed for wheezing or shortness of breath.  Marland Kitchen aspirin EC 81 MG tablet Take 81 mg by mouth daily. Swallow whole.  . budesonide-formoterol (SYMBICORT) 160-4.5 MCG/ACT inhaler Inhale 2 puffs into the lungs 2 (two) times daily.  . hydrochlorothiazide (HYDRODIURIL) 25 MG tablet Take 25 mg by mouth daily.  Marland Kitchen nystatin (MYCOSTATIN)  100000 UNIT/ML suspension Take 5 mLs (500,000 Units total) by mouth 4 (four) times daily.  . solifenacin (VESICARE) 5 MG tablet Take 5 mg by mouth daily.     Allergies:   Penicillins   Social History   Socioeconomic History  . Marital status: Single    Spouse name: Not on file  . Number of children: Not on file  . Years of education: Not on file  . Highest education level: Not on file  Occupational History  . Not on file  Tobacco Use  . Smoking status: Former Games developer  . Smokeless tobacco: Never Used  Substance and Sexual Activity  . Alcohol use: No  . Drug use: No  . Sexual activity: Not on file  Other Topics Concern  . Not on file  Social History Narrative  . Not on file   Social Determinants of Health   Financial Resource Strain: Not on file  Food Insecurity: Not on file  Transportation Needs: Not on file  Physical Activity: Not on file  Stress: Not on file  Social Connections: Not on file     Family History: The patient's family history includes Hypertension in her mother; Renal Disease in her mother.  ROS:   Please see the history of present illness.     All other systems reviewed and are negative.  EKGs/Labs/Other Studies Reviewed:    The following studies were reviewed today:   EKG:  EKG is  ordered today.  The ekg ordered today demonstrates normal sinus rhythm, normal ECG.  Recent Labs: 04/23/2020: ALT 15; BUN 19; Creatinine, Ser 0.79; Hemoglobin 12.2; Platelets 253; Potassium 3.1; Sodium 144  Recent Lipid Panel    Component Value Date/Time   CHOL 184 02/05/2013 0522   TRIG 48 02/05/2013 0522   HDL 46 02/05/2013 0522   VLDL 10 02/05/2013 0522   LDLCALC 128 (H) 02/05/2013 0522     Risk Assessment/Calculations:      Physical Exam:    VS:  BP 120/62 (BP Location: Left Arm, Patient Position: Sitting, Cuff Size: Normal)   Pulse 87   Ht 5' (1.524 m)   Wt 102 lb (46.3 kg)   SpO2 97%   BMI 19.92 kg/m     Wt Readings from Last 3 Encounters:   01/18/21 102 lb (46.3 kg)  04/21/20 128 lb 1.4 oz (58.1 kg)  09/06/17 128 lb (58.1 kg)     GEN:  Well nourished, well developed in no acute distress HEENT: Normal NECK: No JVD; No carotid bruits LYMPHATICS: No lymphadenopathy CARDIAC: RRR, no murmurs, rubs, gallops RESPIRATORY: Diminished breath sounds at bases ABDOMEN: Soft, non-tender, non-distended MUSCULOSKELETAL:  trace edema; No deformity  SKIN: Warm and dry NEUROLOGIC:  Alert and oriented x 3 PSYCHIATRIC:  Normal affect   ASSESSMENT:    1. Bilateral leg edema   2. Primary hypertension    PLAN:    In order of problems listed above:  1. Trace lower extremity edema, get echo to evaluate cardiac dysfunction as cause.  Continue HCTZ as prescribed.  Medication side effects (Lopinavir/Ritonavir) likely contributed to edema. 2. Hypertension, BP controlled, continue HCTZ.  Follow-up after echocardiogram.    Medication Adjustments/Labs and Tests Ordered: Current medicines are reviewed at length with the patient today.  Concerns regarding medicines are outlined above.  Orders Placed This Encounter  Procedures  . EKG 12-Lead  . ECHOCARDIOGRAM COMPLETE   No orders of the defined types were placed in this encounter.   Patient Instructions  Medication Instructions:  Your physician recommends that you continue on your current medications as directed. Please refer to the Current Medication list given to you today.  *If you need a refill on your cardiac medications before your next appointment, please call your pharmacy*   Lab Work: None ordered If you have labs (blood work) drawn today and your tests are completely normal, you will receive your results only by: Marland Kitchen MyChart Message (if you have MyChart) OR . A paper copy in the mail If you have any lab test that is abnormal or we need to change your treatment, we will call you to review the results.   Testing/Procedures:  1.  Your physician has requested that you have  an echocardiogram. Echocardiography is a painless test that uses sound waves to create images of your heart. It provides your doctor with information about the size and shape of your heart and how well your heart's chambers and valves are working. This procedure takes approximately one hour. There are no restrictions for this procedure.     Follow-Up: At [facility], you and your health needs are our priority.  As part of our continuing mission to provide you with exceptional heart care, we have created designated Provider Care Teams.  These Care Teams include your primary Cardiologist (physician) and Advanced Practice Providers (APPs -  Physician Assistants and Nurse Practitioners) who all work together to provide you with the care you need, when you  need it.  We recommend signing up for the patient portal called "MyChart".  Sign up information is provided on this After Visit Summary.  MyChart is used to connect with patients for Virtual Visits (Telemedicine).  Patients are able to view lab/test results, encounter notes, upcoming appointments, etc.  Non-urgent messages can be sent to your provider as well.   To learn more about what you can do with MyChart, go to [email or web address].    Your next appointment:   Follow up after Echo   The format for your next appointment:   In Person  Provider:   Debbe Odea, MD   Other Instructions \    Signed, Debbe Odea, MD  01/18/2021 12:37 PM    [facility]

## 2021-01-18 NOTE — Patient Instructions (Signed)

## 2021-02-15 ENCOUNTER — Other Ambulatory Visit: Payer: Medicare PPO

## 2021-02-17 ENCOUNTER — Other Ambulatory Visit: Payer: Medicare PPO

## 2021-02-22 ENCOUNTER — Ambulatory Visit: Payer: Medicare PPO | Admitting: Cardiology

## 2021-03-18 ENCOUNTER — Ambulatory Visit (INDEPENDENT_AMBULATORY_CARE_PROVIDER_SITE_OTHER): Payer: Medicare PPO

## 2021-03-18 ENCOUNTER — Other Ambulatory Visit: Payer: Self-pay

## 2021-03-18 DIAGNOSIS — R600 Localized edema: Secondary | ICD-10-CM

## 2021-03-18 LAB — ECHOCARDIOGRAM COMPLETE
AR max vel: 2.08 cm2
AV Area VTI: 1.83 cm2
AV Area mean vel: 2.11 cm2
AV Mean grad: 2 mmHg
AV Peak grad: 5 mmHg
Ao pk vel: 1.12 m/s
Area-P 1/2: 2.82 cm2
S' Lateral: 2.8 cm
Single Plane A2C EF: 55.6 %

## 2021-03-29 ENCOUNTER — Ambulatory Visit: Payer: Medicare PPO | Admitting: Cardiology

## 2021-04-23 ENCOUNTER — Ambulatory Visit: Payer: Medicare PPO | Admitting: Cardiology

## 2021-10-08 ENCOUNTER — Ambulatory Visit: Payer: Medicare PPO | Admitting: Family

## 2021-10-12 ENCOUNTER — Other Ambulatory Visit: Payer: Self-pay

## 2021-10-12 ENCOUNTER — Encounter: Payer: Self-pay | Admitting: Family Medicine

## 2021-10-12 ENCOUNTER — Ambulatory Visit: Payer: Medicare PPO | Admitting: Family Medicine

## 2021-10-12 VITALS — BP 122/58 | HR 111 | Temp 98.2°F | Ht 62.0 in | Wt 113.4 lb

## 2021-10-12 DIAGNOSIS — I10 Essential (primary) hypertension: Secondary | ICD-10-CM | POA: Diagnosis not present

## 2021-10-12 DIAGNOSIS — J432 Centrilobular emphysema: Secondary | ICD-10-CM

## 2021-10-12 DIAGNOSIS — R5383 Other fatigue: Secondary | ICD-10-CM | POA: Diagnosis not present

## 2021-10-12 DIAGNOSIS — F329 Major depressive disorder, single episode, unspecified: Secondary | ICD-10-CM

## 2021-10-12 DIAGNOSIS — F330 Major depressive disorder, recurrent, mild: Secondary | ICD-10-CM

## 2021-10-12 DIAGNOSIS — R000 Tachycardia, unspecified: Secondary | ICD-10-CM | POA: Diagnosis not present

## 2021-10-12 LAB — COMPREHENSIVE METABOLIC PANEL
ALT: 10 U/L (ref 0–35)
AST: 21 U/L (ref 0–37)
Albumin: 4.3 g/dL (ref 3.5–5.2)
Alkaline Phosphatase: 49 U/L (ref 39–117)
BUN: 18 mg/dL (ref 6–23)
CO2: 36 mEq/L — ABNORMAL HIGH (ref 19–32)
Calcium: 10 mg/dL (ref 8.4–10.5)
Chloride: 94 mEq/L — ABNORMAL LOW (ref 96–112)
Creatinine, Ser: 0.61 mg/dL (ref 0.40–1.20)
GFR: 92.91 mL/min (ref >60.00)
Glucose, Bld: 104 mg/dL — ABNORMAL HIGH (ref 70–99)
Potassium: 3 mEq/L — ABNORMAL LOW (ref 3.5–5.1)
Sodium: 139 mEq/L (ref 135–145)
Total Bilirubin: 0.4 mg/dL (ref 0.2–1.2)
Total Protein: 7.3 g/dL (ref 6.0–8.3)

## 2021-10-12 LAB — TSH: TSH: 2.62 u[IU]/mL (ref 0.35–5.50)

## 2021-10-12 LAB — LIPID PANEL
Cholesterol: 177 mg/dL (ref 0–200)
HDL: 61.1 mg/dL (ref >39.00)
LDL Cholesterol: 97 mg/dL (ref 0–99)
NonHDL: 116.25
Total CHOL/HDL Ratio: 3
Triglycerides: 97 mg/dL (ref 0.0–149.0)
VLDL: 19.4 mg/dL (ref 0.0–40.0)

## 2021-10-12 LAB — CBC
HCT: 34 % — ABNORMAL LOW (ref 36.0–46.0)
Hemoglobin: 10.9 g/dL — ABNORMAL LOW (ref 12.0–15.0)
MCHC: 31.9 g/dL (ref 30.0–36.0)
MCV: 86.1 fl (ref 78.0–100.0)
Platelets: 292 10*3/uL (ref 150.0–400.0)
RBC: 3.95 Mil/uL (ref 3.87–5.11)
RDW: 14.2 % (ref 11.5–15.5)
WBC: 3.6 10*3/uL — ABNORMAL LOW (ref 4.0–10.5)

## 2021-10-12 LAB — HEMOGLOBIN A1C: Hgb A1c MFr Bld: 5.8 % (ref 4.6–6.5)

## 2021-10-12 LAB — VITAMIN B12: Vitamin B-12: 174 pg/mL — ABNORMAL LOW (ref 211–911)

## 2021-10-12 MED ORDER — ESCITALOPRAM OXALATE 5 MG PO TABS: 5.0000 mg | ORAL_TABLET | Freq: Every day | ORAL | 1 refills | Status: DC

## 2021-10-12 NOTE — Patient Instructions (Signed)
Welcome to [facility]! It was a pleasure meeting you today.  As discussed, Please schedule a 1 month follow up visit today.  Take multivitamin daily Eat healthier choices.   PLEASE NOTE:  If you had any LAB tests please let us know if you have not heard back within a few days. You may see your results on MyChart before we have a chance to review them but we will give you a call once they are reviewed by [place]. If we ordered any REFERRALS today, please let us know if you have not heard from their office within the next week.  Let us know through MyChart if you are needing REFILLS, or have your pharmacy send [place] the request. You can also use MyChart to communicate with me or any office staff.  Please try these tips to maintain a healthy lifestyle:  Eat most of your calories during the day when you are active. Eliminate processed foods including packaged sweets (pies, cakes, cookies), reduce intake of potatoes, white bread, white pasta, and white rice. Look for whole grain options, oat flour or almond flour.  Each meal should contain half fruits/vegetables, one quarter protein, and one quarter carbs (no bigger than a computer mouse).  Cut down on sweet beverages. This includes juice, soda, and sweet tea. Also watch fruit intake, though this is a healthier sweet option, it still contains natural sugar! Limit to 3 servings daily.  Drink at least 1 glass of water with each meal and aim for at least 8 glasses per day  Exercise at least 150 minutes every week.

## 2021-10-12 NOTE — Progress Notes (Signed)
New Patient Office Visit  Subjective:  Patient ID: Carla Rivera, female    DOB: 20-Nov-1955  Age: 66 y.o. MRN: [id number]  CC:  Chief Complaint  Patient presents with   Establish Care   Hypertension   Lack of energy   COPD    Breathing issues related to COPD     HPI- here w/son Cecille Po presents for new pt  Severe COPD-followed by Freeman Cellar at [facility] seen around sept. On O2. Edema-post covid.  Saw Card in Spring.  Stable on HCTZ.  Echo DD Grade1 HTN-on HCTZ-doing well.  145/83.  Usu <140. No HA//CP.   Dizziness if bending over.  Occ sitting. Fatigue-no energy when active. Sleeps fair. Eating fair. Some depressed.  No SI.  Watches grandchildren   Lives w/son/wife/4 children-first floor   Past Medical History:  Diagnosis Date   COPD (chronic obstructive pulmonary disease) (HCC)    Depression    HTN (hypertension)     Past Surgical History:  Procedure Laterality Date   ECTOPIC PREGNANCY SURGERY     Per pt.  No hysterectomy  Family History  Problem Relation Age of Onset   Renal Disease Mother    Hypertension Mother     Social History   Socioeconomic History   Marital status: Single    Spouse name: Not on file   Number of children: Not on file   Years of education: Not on file   Highest education level: Not on file  Occupational History   Not on file  Tobacco Use   Smoking status: Former   Smokeless tobacco: Never  Substance and Sexual Activity   Alcohol use: No   Drug use: No   Sexual activity: Not on file  Other Topics Concern   Not on file  Social History Narrative   Not on file   Social Determinants of Health   Financial Resource Strain: Not on file  Food Insecurity: Not on file  Transportation Needs: Not on file  Physical Activity: Not on file  Stress: Not on file  Social Connections: Not on file  Intimate Partner Violence: Not on file    ROS: negative/noncontributory except as in HPI  Objective:    Today's Vitals: BP (!) 122/58 (BP Location: Right Arm, Patient Position: Sitting)    Pulse (!) 111    Temp 98.2 F (36.8 C) (Temporal)    Ht 5\' 2"  (1.575 m)    Wt 113 lb 6 oz (51.4 kg)    SpO2 94% Comment: 3% O2   BMI 20.74 kg/m   122/58  Gen: WDWN NAD AAF on O2 HEENT: NCAT, conjunctiva not injected, sclera nonicteric. edentulous OP moist, no exudates  NECK:  supple, no thyromegaly, no nodes, no carotid bruits CARDIAC: tachyRRR, S1S2+, no murmur. DP 1+B LUNGS: CTAB. No wheezes but very distant ABDOMEN:  BS+, soft, NTND, No HSM, no masses EXT:  no edema MSK: no gross abnormalities. In w/c NEURO: A&O x3.  CN II-XII intact.  PSYCH: normal mood. Good eye contact    Echo 03/18/21 1. Left ventricular ejection fraction, by estimation, is 60 to 65%. The left ventricle has normal function. The left ventricle has no regional wall motion abnormalities. Left ventricular diastolic parameters are consistent with Grade I diastolic dysfunction (impaired relaxation). 2. Right ventricular systolic function is normal. The right ventricular size is normal. There is normal pulmonary artery systolic pressure. The estimated right ventricular systolic pressure is 31.6 mmHg.  Assessment & Plan:  Problem List Items Addressed This Visit       Cardiovascular and Mediastinum   HTN (hypertension) - Primary   Relevant Medications   atorvastatin (LIPITOR) 20 MG tablet   Other Relevant Orders   CBC   Comprehensive metabolic panel   Hemoglobin A1c   TSH   Lipid panel   EKG 12-Lead     Other   Depression   Relevant Medications   busPIRone (BUSPAR) 5 MG tablet   escitalopram (LEXAPRO) 5 MG tablet   Other Visit Diagnoses     Centrilobular emphysema (HCC)       Relevant Medications   ipratropium-albuterol (DUONEB) 0.5-2.5 (3) MG/3ML SOLN   Fatigue, unspecified type       Relevant Orders   CBC   Comprehensive metabolic panel   Hemoglobin A1c   TSH   Vitamin B12   Tachycardia       Relevant  Orders   EKG 12-Lead     1.  Hypertension-well-controlled.  Was originally elevated in the office.  Advised to check weekly.  If persistently greater than 140/90, let us know.  Continue medications.  Check labs 2.  Severe COPD-patient on continuous oxygen, using inhalers.  Fairly stable.  She does get fatigue with ambulation.  She missed her last appointment with her pulmonologist.  Advised to get rescheduled 3.  Fatigue-mostly with activity.  EKG showed sinus tachycardia.  Suspect more from lungs.  Advised to follow-up with pulmonologist.  We will check labs.  Advised to eat a healthier diet.  Take multivitamin. 4.  Tachycardia-did need EKG to rule out A. fib.  Looks like sinus tach.  Monitor, eat a healthier diet.  Check labs 5.  Depression-we will add Lexapro.  SED.  Follow-up video visit in 1 month.  Outpatient Encounter Medications as of 10/12/2021  Medication Sig   albuterol (PROVENTIL HFA;VENTOLIN HFA) 108 (90 Base) MCG/ACT inhaler Inhale 2 puffs into the lungs every 6 (six) hours as needed for wheezing or shortness of breath.   aspirin EC 81 MG tablet Take 81 mg by mouth daily. Swallow whole.   atorvastatin (LIPITOR) 20 MG tablet Take 20 mg by mouth at bedtime.   budesonide-formoterol (SYMBICORT) 160-4.5 MCG/ACT inhaler Inhale 2 puffs into the lungs 2 (two) times daily.   busPIRone (BUSPAR) 5 MG tablet Take 5 mg by mouth 2 (two) times daily.   escitalopram (LEXAPRO) 5 MG tablet Take 1 tablet (5 mg total) by mouth daily.   hydrochlorothiazide (HYDRODIURIL) 25 MG tablet Take 25 mg by mouth daily.   ipratropium-albuterol (DUONEB) 0.5-2.5 (3) MG/3ML SOLN Inhale into the lungs.   nystatin (MYCOSTATIN) 100000 UNIT/ML suspension Take 5 mLs (500,000 Units total) by mouth 4 (four) times daily.   Potassium Chloride ER 20 MEQ TBCR Take 1 tablet by mouth daily.   solifenacin (VESICARE) 5 MG tablet Take 5 mg by mouth daily.   No facility-administered encounter medications on file as of 10/12/2021.     Follow-up: Return in about 4 weeks (around 11/09/2021) for video-moods. 1 mo  Angelena Sole, MD

## 2021-10-13 ENCOUNTER — Other Ambulatory Visit: Payer: Self-pay | Admitting: Family Medicine

## 2021-10-13 DIAGNOSIS — E876 Hypokalemia: Secondary | ICD-10-CM

## 2021-10-13 DIAGNOSIS — D513 Other dietary vitamin B12 deficiency anemia: Secondary | ICD-10-CM

## 2021-10-13 MED ORDER — POTASSIUM CHLORIDE ER 20 MEQ PO TBCR
1.0000 | EXTENDED_RELEASE_TABLET | Freq: Two times a day (BID) | ORAL | 1 refills | Status: DC
Start: 2021-10-13 — End: 2022-06-25

## 2021-11-03 ENCOUNTER — Other Ambulatory Visit: Payer: Self-pay | Admitting: Family Medicine

## 2021-11-05 ENCOUNTER — Other Ambulatory Visit: Payer: Self-pay | Admitting: Family Medicine

## 2021-11-05 NOTE — Telephone Encounter (Signed)
Spoke with patient and she stated that she does not take losartan, she only take HCTZ for blood pressure.

## 2021-11-09 ENCOUNTER — Other Ambulatory Visit: Payer: Self-pay

## 2021-11-09 ENCOUNTER — Encounter: Payer: Self-pay | Admitting: Family Medicine

## 2021-11-09 ENCOUNTER — Telehealth (INDEPENDENT_AMBULATORY_CARE_PROVIDER_SITE_OTHER): Payer: Medicare PPO | Admitting: Family Medicine

## 2021-11-09 DIAGNOSIS — I10 Essential (primary) hypertension: Secondary | ICD-10-CM

## 2021-11-09 DIAGNOSIS — D513 Other dietary vitamin B12 deficiency anemia: Secondary | ICD-10-CM

## 2021-11-09 DIAGNOSIS — J441 Chronic obstructive pulmonary disease with (acute) exacerbation: Secondary | ICD-10-CM

## 2021-11-09 DIAGNOSIS — F330 Major depressive disorder, recurrent, mild: Secondary | ICD-10-CM

## 2021-11-09 NOTE — Patient Instructions (Signed)
It was very nice to see you today!  Get B12 vitamins   PLEASE NOTE:  If you had any lab tests please let us know if you have not heard back within a few days. You may see your results on MyChart before we have a chance to review them but we will give you a call once they are reviewed by [place]. If we ordered any referrals today, please let us know if you have not heard from their office within the next week.   Please try these tips to maintain a healthy lifestyle:  Eat most of your calories during the day when you are active. Eliminate processed foods including packaged sweets (pies, cakes, cookies), reduce intake of potatoes, white bread, white pasta, and white rice. Look for whole grain options, oat flour or almond flour.  Each meal should contain half fruits/vegetables, one quarter protein, and one quarter carbs (no bigger than a computer mouse).  Cut down on sweet beverages. This includes juice, soda, and sweet tea. Also watch fruit intake, though this is a healthier sweet option, it still contains natural sugar! Limit to 3 servings daily.  Drink at least 1 glass of water with each meal and aim for at least 8 glasses per day  Exercise at least 150 minutes every week.

## 2021-11-09 NOTE — Progress Notes (Signed)
Virtual telephone visit    Virtual Visit via Telephone Note   This visit type was conducted due to national recommendations for restrictions regarding the COVID-19 Pandemic (e.g. social distancing) in an effort to limit this patient's exposure and mitigate transmission in our community. Due to her co-morbid illnesses, this patient is at least at moderate risk for complications without adequate follow up. This format is felt to be most appropriate for this patient at this time. The patient did not have access to video technology or had technical difficulties with video requiring transitioning to audio format only (telephone). Physical exam was limited to content and character of the telephone converstion. i was able to get the patient set up on a telephone visit.   Patient location: home Patient and provider in visit Provider location: Office  I discussed the limitations of evaluation and management by telemedicine and the availability of in person appointments. The patient expressed understanding and agreed to proceed.   Visit Date: 11/09/2021  Today's healthcare provider: Angelena SoleAnn M Stan Cantave, MD     Subjective:    Patient ID: Carla Rivera, female    DOB: [birth year], 66 y.o.   MRN: [id number]  No chief complaint on file.   HPI-I couldn't get on video so tele Patient is in today for f/u moods 1  COPD-saw pulm.  Told to take vits.  Told K was better. (Per pt, he did labs)-reviewed notes 2.  Vitamin B12 def-declines B12 shots.  Will get pills.  3.  HTN-130/forgot.  No CP. Doing well on HCTZ 4.  Depression-doing better on meds.  No SI. No SE-ok to continue  Past Medical History:  Diagnosis Date   Arthritis    COPD (chronic obstructive pulmonary disease) (HCC)    Depression    HTN (hypertension)     Past Surgical History:  Procedure Laterality Date   ECTOPIC PREGNANCY SURGERY      Family History  Problem Relation Age of Onset   Renal Disease Mother    Hypertension Mother     Cancer Brother     Social History   Socioeconomic History   Marital status: Single    Spouse name: Not on file   Number of children: 3   Years of education: Not on file   Highest education level: Not on file  Occupational History   Not on file  Tobacco Use   Smoking status: Former    Types: Cigarettes   Smokeless tobacco: Never  Vaping Use   Vaping Use: Never used  Substance and Sexual Activity   Alcohol use: Not Currently    Alcohol/week: 2.0 standard drinks    Types: 1 Glasses of wine, 1 Cans of beer per week   Drug use: Never   Sexual activity: Not Currently  Other Topics Concern   Not on file  Social History Narrative   Not on file   Social Determinants of Health   Financial Resource Strain: Not on file  Food Insecurity: Not on file  Transportation Needs: Not on file  Physical Activity: Not on file  Stress: Not on file  Social Connections: Not on file  Intimate Partner Violence: Not on file    Outpatient Medications Prior to Visit  Medication Sig Dispense Refill   albuterol (PROVENTIL HFA;VENTOLIN HFA) 108 (90 Base) MCG/ACT inhaler Inhale 2 puffs into the lungs every 6 (six) hours as needed for wheezing or shortness of breath. 1 Inhaler 2   aspirin EC 81 MG tablet Take 81 mg by  mouth daily. Swallow whole.     atorvastatin (LIPITOR) 20 MG tablet Take 20 mg by mouth at bedtime.     budesonide-formoterol (SYMBICORT) 160-4.5 MCG/ACT inhaler Inhale 2 puffs into the lungs 2 (two) times daily. 1 Inhaler 12   busPIRone (BUSPAR) 5 MG tablet Take 5 mg by mouth 2 (two) times daily.     Cholecalciferol 50 MCG (2000 UT) TABS Take by mouth.     escitalopram (LEXAPRO) 5 MG tablet TAKE 1 TABLET (5 MG TOTAL) BY MOUTH DAILY. 90 tablet 1   fluticasone (FLONASE) 50 MCG/ACT nasal spray Place into the nose.     hydrochlorothiazide (HYDRODIURIL) 25 MG tablet Take 25 mg by mouth daily.     ipratropium-albuterol (DUONEB) 0.5-2.5 (3) MG/3ML SOLN Inhale into the lungs.     nystatin  (MYCOSTATIN) 100000 UNIT/ML suspension Take 5 mLs (500,000 Units total) by mouth 4 (four) times daily. 60 mL 0   Potassium Chloride ER 20 MEQ TBCR Take 1 tablet by mouth in the morning and at bedtime. 180 tablet 1   solifenacin (VESICARE) 5 MG tablet Take 5 mg by mouth daily.     No facility-administered medications prior to visit.    Allergies  Allergen Reactions   Penicillins Rash, Other (See Comments) and Hives    Has patient had a PCN reaction causing immediate rash, facial/tongue/throat swelling, SOB or lightheadedness with hypotension: No Has patient had a PCN reaction causing severe rash involving mucus membranes or skin necrosis: No Has patient had a PCN reaction that required hospitalization No Has patient had a PCN reaction occurring within the last 10 years: No If all of the above answers are "NO", then may proceed with Cephalosporin use.     ROS     Objective:    Physical Exam-didn't sound sob-speakin in full sentences  There were no vitals taken for this visit. Wt Readings from Last 3 Encounters:  10/12/21 113 lb 6 oz (51.4 kg)  01/18/21 102 lb (46.3 kg)  04/21/20 128 lb 1.4 oz (58.1 kg)  Labs from pulm note, but can't find date. TSH 0.35 - 5.50 uIU/mL 2.62   Sodium 135 - 145 mEq/L 139  Potassium 3.5 - 5.1 mEq/L 3.0 Low   Chloride 96 - 112 mEq/L 94 Low   CO2 19 - 32 mEq/L 36 High   Glucose, Bld 70 - 99 mg/dL 104 High   BUN 6 - 23 mg/dL 18  Creatinine, Ser 0.40 - 1.20 mg/dL 0.61  Total Bilirubin 0.2 - 1.2 mg/dL 0.4  Alkaline Phosphatase 39 - 117 U/L 49  AST 0 - 37 U/L 21  ALT 0 - 35 U/L 10  Total Protein 6.0 - 8.3 g/dL 7.3  Albumin 3.5 - 5.2 g/dL 4.3  GFR >60.00 mL/min 92.91 Calculated using the CKD-EPI Creatinine Equation (2021)  Calcium 8.4 - 10.5 mg/dL 10.0   WBC 4.0 - 10.5 K/uL 3.6 Low   RBC 3.87 - 5.11 Mil/uL 3.95  Platelets 150.0 - 400.0 K/uL 292.0  Hemoglobin 12.0 - 15.0 g/dL 10.9 Low   HCT 36.0 - 46.0 % 34.0 Low   MCV 78.0 - 100.0 fl 86.1   MCHC 30.0 - 36.0 g/dL 31.9  RDW 11.5 - 15.5 % 14.2   Vit D 19.4-done at appt pulm Mg 1.9-done at appt pulm      Assessment & Plan:   Problem List Items Addressed This Visit       Cardiovascular and Mediastinum   HTN (hypertension) - Primary  Respiratory   COPD exacerbation (HCC)   Relevant Medications   fluticasone (FLONASE) 50 MCG/ACT nasal spray     Other   Depression   Other Visit Diagnoses     Other dietary vitamin B12 deficiency anemia          COPD-stable on meds/O2.  Followed by Pulm-saw last month HTN-wellcontrolled.  Cont meds Depression-better on Lexapro-continue B12 deficiency-discussed injections-declines.  Will get OTC.    I am having Bruna Potter maintain her albuterol, budesonide-formoterol, nystatin, hydrochlorothiazide, solifenacin, aspirin EC, busPIRone, ipratropium-albuterol, atorvastatin, Potassium Chloride ER, escitalopram, Cholecalciferol, and fluticasone.  No orders of the defined types were placed in this encounter.    I discussed the assessment and treatment plan with the patient. The patient was provided an opportunity to ask questions and all were answered. The patient agreed with the plan and demonstrated an understanding of the instructions.   The patient was advised to call back or seek an in-person evaluation if the symptoms worsen or if the condition fails to improve as anticipated.  I provided 15 minutes of non-face-to-face time during this encounter.   Wellington Hampshire, MD [place] [phone] (phone) [phone] (fax)  [place]

## 2022-01-11 ENCOUNTER — Telehealth: Payer: Self-pay

## 2022-01-11 ENCOUNTER — Other Ambulatory Visit: Payer: Self-pay | Admitting: Family Medicine

## 2022-01-11 MED ORDER — BUSPIRONE HCL 5 MG PO TABS: 5.0000 mg | ORAL_TABLET | Freq: Two times a day (BID) | ORAL | 1 refills | Status: DC

## 2022-01-11 NOTE — Telephone Encounter (Signed)
..   Encourage patient to contact the pharmacy for refills or they can request refills through [place]: 11/09/21  NEXT APPOINTMENT DATE: na  MEDICATION:  Buspirone   Is the patient out of medication?   PHARMACY:  [facility] [place] on [address]  Let patient know to contact pharmacy at the end of the day to make sure medication is ready.  Please notify patient to allow 48-72 hours to process  CLINICAL FILLS OUT ALL BELOW:   LAST REFILL:  QTY:  REFILL DATE:    OTHER COMMENTS:    Okay for refill?  Please advise

## 2022-01-12 NOTE — Telephone Encounter (Signed)
Patient scheduled.

## 2022-02-08 ENCOUNTER — Ambulatory Visit (INDEPENDENT_AMBULATORY_CARE_PROVIDER_SITE_OTHER): Payer: Medicare PPO

## 2022-02-08 DIAGNOSIS — Z0000 Encounter for general adult medical examination without abnormal findings: Secondary | ICD-10-CM

## 2022-02-08 NOTE — Patient Instructions (Signed)
[name] , ?Thank you for taking time to come for your Medicare Wellness Visit. I appreciate your ongoing commitment to your health goals. Please review the following plan we discussed and let me know if I can assist you in the future.  ? ?Screening recommendations/referrals: ?Colonoscopy: declined and discussed  ?Mammogram: declined and discussed  ?Bone Density: declined and discussed  ?Recommended yearly ophthalmology/optometry visit for glaucoma screening and checkup ?Recommended yearly dental visit for hygiene and checkup ? ?Vaccinations: ?Influenza vaccine: declined and discussed  ?Pneumococcal vaccine: declined and discussed  ?Tdap vaccine: due  ?Shingles vaccine: Shingrix discussed. Please contact your pharmacy for coverage information.    ?Covid-19:declined and discussed  ? ?Advanced directives: Please bring a copy of your health care power of attorney and living will to the office at your convenience. ? ?Conditions/risks identified: None at this time  ? ?Next appointment: Follow up in one year for your annual wellness visit  ? ? ?Preventive Care 54 Years and Older, Female ?Preventive care refers to lifestyle choices and visits with your health care provider that can promote health and wellness. ?What does preventive care include? ?A yearly physical exam. This is also called an annual well check. ?Dental exams once or twice a year. ?Routine eye exams. Ask your health care provider how often you should have your eyes checked. ?Personal lifestyle choices, including: ?Daily care of your teeth and gums. ?Regular physical activity. ?Eating a healthy diet. ?Avoiding tobacco and drug use. ?Limiting alcohol use. ?Practicing safe sex. ?Taking low-dose aspirin every day. ?Taking vitamin and mineral supplements as recommended by your health care provider. ?What happens during an annual well check? ?The services and screenings done by your health care provider during your annual well check will depend on your age,  overall health, lifestyle risk factors, and family history of disease. ?Counseling  ?Your health care provider may ask you questions about your: ?Alcohol use. ?Tobacco use. ?Drug use. ?Emotional well-being. ?Home and relationship well-being. ?Sexual activity. ?Eating habits. ?History of falls. ?Memory and ability to understand (cognition). ?Work and work Astronomer. ?Reproductive health. ?Screening  ?You may have the following tests or measurements: ?Height, weight, and BMI. ?Blood pressure. ?Lipid and cholesterol levels. These may be checked every 5 years, or more frequently if you are over 28 years old. ?Skin check. ?Lung cancer screening. You may have this screening every year starting at age 45 if you have a 30-pack-year history of smoking and currently smoke or have quit within the past 15 years. ?Fecal occult blood test (FOBT) of the stool. You may have this test every year starting at age 26. ?Flexible sigmoidoscopy or colonoscopy. You may have a sigmoidoscopy every 5 years or a colonoscopy every 10 years starting at age 66. ?Hepatitis C blood test. ?Hepatitis B blood test. ?Sexually transmitted disease (STD) testing. ?Diabetes screening. This is done by checking your blood sugar (glucose) after you have not eaten for a while (fasting). You may have this done every 1-3 years. ?Bone density scan. This is done to screen for osteoporosis. You may have this done starting at age 62. ?Mammogram. This may be done every 1-2 years. Talk to your health care provider about how often you should have regular mammograms. ?Talk with your health care provider about your test results, treatment options, and if necessary, the need for more tests. ?Vaccines  ?Your health care provider may recommend certain vaccines, such as: ?Influenza vaccine. This is recommended every year. ?Tetanus, diphtheria, and acellular pertussis (Tdap, Td) vaccine. You may  need a Td booster every 10 years. ?Zoster vaccine. You may need this after age  75. ?Pneumococcal 13-valent conjugate (PCV13) vaccine. One dose is recommended after age 51. ?Pneumococcal polysaccharide (PPSV23) vaccine. One dose is recommended after age 59. ?Talk to your health care provider about which screenings and vaccines you need and how often you need them. ?This information is not intended to replace advice given to you by your health care provider. Make sure you discuss any questions you have with your health care provider. ?Document Released: 10/16/2015 Document Revised: 06/08/2016 Document Reviewed: 07/21/2015 ?Elsevier Interactive Patient Education ? 2017 [employer]. ? ?Fall Prevention in the Home ?Falls can cause injuries. They can happen to people of all ages. There are many things you can do to make your home safe and to help prevent falls. ?What can I do on the outside of my home? ?Regularly fix the edges of walkways and driveways and fix any cracks. ?Remove anything that might make you trip as you walk through a door, such as a raised step or threshold. ?Trim any bushes or trees on the path to your home. ?Use bright outdoor lighting. ?Clear any walking paths of anything that might make someone trip, such as rocks or tools. ?Regularly check to see if handrails are loose or broken. Make sure that both sides of any steps have handrails. ?Any raised decks and porches should have guardrails on the edges. ?Have any leaves, snow, or ice cleared regularly. ?Use sand or salt on walking paths during winter. ?Clean up any spills in your garage right away. This includes oil or grease spills. ?What can I do in the bathroom? ?Use night lights. ?Install grab bars by the toilet and in the tub and shower. Do not use towel bars as grab bars. ?Use non-skid mats or decals in the tub or shower. ?If you need to sit down in the shower, use a plastic, non-slip stool. ?Keep the floor dry. Clean up any water that spills on the floor as soon as it happens. ?Remove soap buildup in the tub or shower  regularly. ?Attach bath mats securely with double-sided non-slip rug tape. ?Do not have throw rugs and other things on the floor that can make you trip. ?What can I do in the bedroom? ?Use night lights. ?Make sure that you have a light by your bed that is easy to reach. ?Do not use any sheets or blankets that are too big for your bed. They should not hang down onto the floor. ?Have a firm chair that has side arms. You can use this for support while you get dressed. ?Do not have throw rugs and other things on the floor that can make you trip. ?What can I do in the kitchen? ?Clean up any spills right away. ?Avoid walking on wet floors. ?Keep items that you use a lot in easy-to-reach places. ?If you need to reach something above you, use a strong step stool that has a grab bar. ?Keep electrical cords out of the way. ?Do not use floor polish or wax that makes floors slippery. If you must use wax, use non-skid floor wax. ?Do not have throw rugs and other things on the floor that can make you trip. ?What can I do with my stairs? ?Do not leave any items on the stairs. ?Make sure that there are handrails on both sides of the stairs and use them. Fix handrails that are broken or loose. Make sure that handrails are as long as the stairways. ?  Check any carpeting to make sure that it is firmly attached to the stairs. Fix any carpet that is loose or worn. ?Avoid having throw rugs at the top or bottom of the stairs. If you do have throw rugs, attach them to the floor with carpet tape. ?Make sure that you have a light switch at the top of the stairs and the bottom of the stairs. If you do not have them, ask someone to add them for you. ?What else can I do to help prevent falls? ?Wear shoes that: ?Do not have high heels. ?Have rubber bottoms. ?Are comfortable and fit you well. ?Are closed at the toe. Do not wear sandals. ?If you use a stepladder: ?Make sure that it is fully opened. Do not climb a closed stepladder. ?Make sure that  both sides of the stepladder are locked into place. ?Ask someone to hold it for you, if possible. ?Clearly mark and make sure that you can see: ?Any grab bars or handrails. ?First and last steps. ?Where the

## 2022-02-08 NOTE — Progress Notes (Signed)
Virtual Visit via Telephone Note ? ?I connected with  Carla Rivera on 02/08/22 at 11:00 AM EDT by telephone and verified that I am speaking with the correct person using two identifiers. ? ?Medicare Annual Wellness visit completed telephonically due to Covid-19 pandemic.  ? ?Persons participating in this call: This Health Coach and this patient.  ? ?Location: ?Patient: home ?Provider: office ?  ?I discussed the limitations, risks, security and privacy concerns of performing an evaluation and management service by telephone and the availability of in person appointments. The patient expressed understanding and agreed to proceed. ? ?Unable to perform video visit due to video visit attempted and failed and/or patient does not have video capability.  ? ?Some vital signs may be absent or patient reported.  ? ?Marzella Schlein, LPN ? ? ?Subjective:  ? Carla Rivera is a 66 y.o. female who presents for an Initial Medicare Annual Wellness Visit. ? ?Review of Systems    ? ?Cardiac Risk Factors include: advanced age (>49men, >34 women);hypertension;sedentary lifestyle ? ?   ?Objective:  ?  ?There were no vitals filed for this visit. ?There is no height or weight on file to calculate BMI. ? ? ?  02/08/2022  ? 11:08 AM 04/23/2020  ?  6:00 AM 04/22/2020  ?  6:51 AM 09/06/2017  ?  3:38 PM 12/12/2016  ?  1:00 AM 12/11/2016  ?  7:09 PM  ?Advanced Directives  ?Does Patient Have a Medical Advance Directive? Yes No No No No No  ?Type of Estate agent of Attorney       ?Copy of Healthcare Power of Attorney in Chart? No - copy requested       ?Would patient like information on creating a medical advance directive?   No - Patient declined  No - Patient declined No - Patient declined  ? ? ?Current Medications (verified) ?Outpatient Encounter Medications as of 02/08/2022  ?Medication Sig  ? albuterol (PROVENTIL HFA;VENTOLIN HFA) 108 (90 Base) MCG/ACT inhaler Inhale 2 puffs into the lungs every 6 (six) hours as needed for  wheezing or shortness of breath.  ? aspirin EC 81 MG tablet Take 81 mg by mouth daily. Swallow whole.  ? atorvastatin (LIPITOR) 20 MG tablet Take 20 mg by mouth at bedtime.  ? budesonide-formoterol (SYMBICORT) 160-4.5 MCG/ACT inhaler Inhale 2 puffs into the lungs 2 (two) times daily.  ? busPIRone (BUSPAR) 5 MG tablet Take 1 tablet (5 mg total) by mouth 2 (two) times daily.  ? Cholecalciferol 50 MCG (2000 UT) TABS Take by mouth.  ? escitalopram (LEXAPRO) 5 MG tablet TAKE 1 TABLET (5 MG TOTAL) BY MOUTH DAILY.  ? fluticasone (FLONASE) 50 MCG/ACT nasal spray Place into the nose.  ? hydrochlorothiazide (HYDRODIURIL) 25 MG tablet Take 25 mg by mouth daily.  ? ipratropium-albuterol (DUONEB) 0.5-2.5 (3) MG/3ML SOLN Inhale into the lungs.  ? nystatin (MYCOSTATIN) 100000 UNIT/ML suspension Take 5 mLs (500,000 Units total) by mouth 4 (four) times daily.  ? Potassium Chloride ER 20 MEQ TBCR Take 1 tablet by mouth in the morning and at bedtime.  ? solifenacin (VESICARE) 5 MG tablet Take 5 mg by mouth daily.  ? ?No facility-administered encounter medications on file as of 02/08/2022.  ? ? ?Allergies (verified) ?Penicillins  ? ?History: ?Past Medical History:  ?Diagnosis Date  ? Arthritis   ? COPD (chronic obstructive pulmonary disease) (HCC)   ? Depression   ? HTN (hypertension)   ? ?Past Surgical History:  ?Procedure Laterality Date  ?  ECTOPIC PREGNANCY SURGERY    ? ?Family History  ?Problem Relation Age of Onset  ? Renal Disease Mother   ? Hypertension Mother   ? Cancer Brother   ? ?Social History  ? ?Socioeconomic History  ? Marital status: Single  ?  Spouse name: Not on file  ? Number of children: 3  ? Years of education: Not on file  ? Highest education level: Not on file  ?Occupational History  ? Not on file  ?Tobacco Use  ? Smoking status: Former  ?  Types: Cigarettes  ? Smokeless tobacco: Never  ?Vaping Use  ? Vaping Use: Never used  ?Substance and Sexual Activity  ? Alcohol use: Not Currently  ?  Alcohol/week: 2.0 standard  drinks  ?  Types: 1 Glasses of wine, 1 Cans of beer per week  ? Drug use: Never  ? Sexual activity: Not Currently  ?Other Topics Concern  ? Not on file  ?Social History Narrative  ? Not on file  ? ?Social Determinants of Health  ? ?Financial Resource Strain: Low Risk   ? Difficulty of Paying Living Expenses: Not hard at all  ?Food Insecurity: No Food Insecurity  ? Worried About Programme researcher, broadcasting/film/videounning Out of Food in the Last Year: Never true  ? Ran Out of Food in the Last Year: Never true  ?Transportation Needs: No Transportation Needs  ? Lack of Transportation (Medical): No  ? Lack of Transportation (Non-Medical): No  ?Physical Activity: Inactive  ? Days of Exercise per Week: 0 days  ? Minutes of Exercise per Session: 0 min  ?Stress: No Stress Concern Present  ? Feeling of Stress : Not at all  ?Social Connections: Socially Isolated  ? Frequency of Communication with Friends and Family: More than three times a week  ? Frequency of Social Gatherings with Friends and Family: Once a week  ? Attends Religious Services: Never  ? Active Member of Clubs or Organizations: No  ? Attends BankerClub or Organization Meetings: Never  ? Marital Status: Never married  ? ? ?Tobacco Counseling ?Counseling given: Not Answered ? ? ?Clinical Intake: ? ?Pre-visit preparation completed: Yes ? ?Pain : No/denies pain ? ?  ? ?BMI - recorded: 20.74 ?Nutritional Status: BMI of 19-24  Normal ?Nutritional Risks: None ?Diabetes: No ? ?How often do you need to have someone help you when you read instructions, pamphlets, or other written materials from your doctor or pharmacy?: 1 - Never ? ?Diabetic?no ? ?Interpreter Needed?: No ? ?Information entered by :: Lanier Ensignina Marjean Imperato, LPN ? ? ?Activities of Daily Living ? ?  02/08/2022  ? 11:09 AM  ?In your present state of health, do you have any difficulty performing the following activities:  ?Hearing? 0  ?Vision? 0  ?Difficulty concentrating or making decisions? 0  ?Walking or climbing stairs? 0  ?Dressing or bathing? 0  ?Doing  errands, shopping? 0  ?Preparing Food and eating ? N  ?Using the Toilet? N  ?In the past six months, have you accidently leaked urine? N  ?Do you have problems with loss of bowel control? N  ?Managing your Medications? N  ?Managing your Finances? N  ?Housekeeping or managing your Housekeeping? N  ? ? ?Patient Care Team: ?Jeani SowKulik, Ann Marie, MD as PCP - General (Family Medicine) ?Debbe OdeaAgbor-Etang, Brian, MD as PCP - Cardiology (Cardiology) ? ?Indicate any recent Medical Services you may have received from other than [facility] providers in the past year (date may be approximate). ? ?   ?Assessment:  ? This is  a routine wellness examination for Jennyfer. ? ?Hearing/Vision screen ?Hearing Screening - Comments:: Pt denies any hearing issues  ?Vision Screening - Comments:: Pt follows up in [place] for eye exams  ? ?Dietary issues and exercise activities discussed: ?Current Exercise Habits: The patient does not participate in regular exercise at present ? ? Goals Addressed   ? ?  ?  ?  ?  ? This Visit's Progress  ?  Patient Stated     ?  None at this time  ?  ? ?  ? ?Depression Screen ? ?  02/08/2022  ? 11:07 AM 10/12/2021  ? 10:23 AM  ?PHQ 2/9 Scores  ?PHQ - 2 Score 0 4  ?PHQ- 9 Score  11  ?  ?Fall Risk ? ?  02/08/2022  ? 11:09 AM  ?Fall Risk   ?Falls in the past year? 0  ?Number falls in past yr: 0  ?Injury with Fall? 0  ?Risk for fall due to : Impaired vision  ?Follow up Falls prevention discussed  ? ? ?FALL RISK PREVENTION PERTAINING TO THE HOME: ? ?Any stairs in or around the home? Yes  ?If so, are there any without handrails? No  ?Home free of loose throw rugs in walkways, pet beds, electrical cords, etc? Yes  ?Adequate lighting in your home to reduce risk of falls? Yes  ? ?ASSISTIVE DEVICES UTILIZED TO PREVENT FALLS: ? ?Life alert? yes ?Use of a cane, walker or w/c? Yes  ?Grab bars in the bathroom? No  ?Shower chair or bench in shower? No  ?Elevated toilet seat or a handicapped toilet? No  ? ?TIMED UP AND GO: ? ?Was the test  performed? No .  ?Cognitive Function: ?  ?  ? ?  02/08/2022  ? 11:10 AM  ?6CIT Screen  ?What Year? 0 points  ?What month? 0 points  ?What time? 0 points  ?Count back from 20 2 points  ?Months in reverse

## 2022-03-15 ENCOUNTER — Ambulatory Visit (INDEPENDENT_AMBULATORY_CARE_PROVIDER_SITE_OTHER): Payer: Medicare PPO | Admitting: Family Medicine

## 2022-03-15 ENCOUNTER — Telehealth: Payer: Self-pay

## 2022-03-15 ENCOUNTER — Ambulatory Visit: Payer: Medicare PPO | Admitting: Family Medicine

## 2022-03-15 ENCOUNTER — Encounter: Payer: Self-pay | Admitting: Family Medicine

## 2022-03-15 VITALS — BP 140/72 | HR 90 | Temp 98.2°F | Ht 60.0 in | Wt 120.2 lb

## 2022-03-15 DIAGNOSIS — D513 Other dietary vitamin B12 deficiency anemia: Secondary | ICD-10-CM

## 2022-03-15 DIAGNOSIS — I10 Essential (primary) hypertension: Secondary | ICD-10-CM

## 2022-03-15 DIAGNOSIS — F325 Major depressive disorder, single episode, in full remission: Secondary | ICD-10-CM | POA: Diagnosis not present

## 2022-03-15 LAB — COMPREHENSIVE METABOLIC PANEL
ALT: 12 U/L (ref 0–35)
AST: 22 U/L (ref 0–37)
Albumin: 4.2 g/dL (ref 3.5–5.2)
Alkaline Phosphatase: 52 U/L (ref 39–117)
BUN: 16 mg/dL (ref 6–23)
CO2: 35 mEq/L — ABNORMAL HIGH (ref 19–32)
Calcium: 9.6 mg/dL (ref 8.4–10.5)
Chloride: 97 mEq/L (ref 96–112)
Creatinine, Ser: 0.62 mg/dL (ref 0.40–1.20)
GFR: 92.28 mL/min (ref >60.00)
Glucose, Bld: 87 mg/dL (ref 70–99)
Potassium: 3.1 mEq/L — ABNORMAL LOW (ref 3.5–5.1)
Sodium: 140 mEq/L (ref 135–145)
Total Bilirubin: 0.3 mg/dL (ref 0.2–1.2)
Total Protein: 7.2 g/dL (ref 6.0–8.3)

## 2022-03-15 LAB — MAGNESIUM: Magnesium: 1.9 mg/dL (ref 1.5–2.5)

## 2022-03-15 LAB — VITAMIN B12: Vitamin B-12: 1141 pg/mL — ABNORMAL HIGH (ref 211–911)

## 2022-03-15 NOTE — Patient Instructions (Signed)
It was very nice to see you today!  Keep up good work   PLEASE NOTE:  If you had any lab tests please let us know if you have not heard back within a few days. You may see your results on MyChart before we have a chance to review them but we will give you a call once they are reviewed by us. If we ordered any referrals today, please let us know if you have not heard from their office within the next week.   Please try these tips to maintain a healthy lifestyle:  Eat most of your calories during the day when you are active. Eliminate processed foods including packaged sweets (pies, cakes, cookies), reduce intake of potatoes, white bread, white pasta, and white rice. Look for whole grain options, oat flour or almond flour.  Each meal should contain half fruits/vegetables, one quarter protein, and one quarter carbs (no bigger than a computer mouse).  Cut down on sweet beverages. This includes juice, soda, and sweet tea. Also watch fruit intake, though this is a healthier sweet option, it still contains natural sugar! Limit to 3 servings daily.  Drink at least 1 glass of water with each meal and aim for at least 8 glasses per day  Exercise at least 150 minutes every week.   

## 2022-03-15 NOTE — Progress Notes (Signed)
Subjective:     Patient ID: Carla Rivera, female    DOB: 04-Apr-1956, 66 y.o.   MRN: [id number]  Chief Complaint  Patient presents with   Follow-up    2 month follow-up Fasting     HPI-lives w/son and wife.  HTN-taking hctz and K 1 in am and 1/2 in pm.  Bp running 140/can't recall.  No ha/dizzines/cp/edema.   COPD-seeing Pulm.  On O2.  No cough.  No inc above baseline sob Heartburn-1-2x/wk.  Tums works.then none for weeks. Spicey foods occ.  Depression/anx.  Doing well on lexapro and buspar once/d.  No SI HLD-doing ok on lipitor.  LDL <100  Health Maintenance Due  Topic Date Due   Hepatitis C Screening  Never done    Past Medical History:  Diagnosis Date   Arthritis    COPD (chronic obstructive pulmonary disease) (HCC)    Depression    HTN (hypertension)     Past Surgical History:  Procedure Laterality Date   ECTOPIC PREGNANCY SURGERY      Outpatient Medications Prior to Visit  Medication Sig Dispense Refill   albuterol (PROVENTIL HFA;VENTOLIN HFA) 108 (90 Base) MCG/ACT inhaler Inhale 2 puffs into the lungs every 6 (six) hours as needed for wheezing or shortness of breath. 1 Inhaler 2   aspirin EC 81 MG tablet Take 81 mg by mouth daily. Swallow whole.     atorvastatin (LIPITOR) 20 MG tablet Take 20 mg by mouth at bedtime.     budesonide-formoterol (SYMBICORT) 160-4.5 MCG/ACT inhaler Inhale 2 puffs into the lungs 2 (two) times daily. 1 Inhaler 12   busPIRone (BUSPAR) 5 MG tablet Take 1 tablet (5 mg total) by mouth 2 (two) times daily. 180 tablet 1   Cholecalciferol 50 MCG (2000 UT) TABS Take by mouth.     escitalopram (LEXAPRO) 5 MG tablet TAKE 1 TABLET (5 MG TOTAL) BY MOUTH DAILY. 90 tablet 1   fluticasone (FLONASE) 50 MCG/ACT nasal spray Place into the nose.     hydrochlorothiazide (HYDRODIURIL) 25 MG tablet Take 25 mg by mouth daily.     ipratropium-albuterol (DUONEB) 0.5-2.5 (3) MG/3ML SOLN Inhale into the lungs.     nystatin (MYCOSTATIN) 100000 UNIT/ML  suspension Take 5 mLs (500,000 Units total) by mouth 4 (four) times daily. 60 mL 0   Potassium Chloride ER 20 MEQ TBCR Take 1 tablet by mouth in the morning and at bedtime. 180 tablet 1   solifenacin (VESICARE) 5 MG tablet Take 5 mg by mouth daily.     ipratropium-albuterol (DUONEB) 0.5-2.5 (3) MG/3ML SOLN Inhale into the lungs.     No facility-administered medications prior to visit.    Allergies  Allergen Reactions   Penicillins Rash, Other (See Comments) and Hives    Has patient had a PCN reaction causing immediate rash, facial/tongue/throat swelling, SOB or lightheadedness with hypotension: No Has patient had a PCN reaction causing severe rash involving mucus membranes or skin necrosis: No Has patient had a PCN reaction that required hospitalization No Has patient had a PCN reaction occurring within the last 10 years: No If all of the above answers are "NO", then may proceed with Cephalosporin use.    ROS neg/noncontributory except as noted HPI/below Chronic L arm and R leg pain-arthritis-more w/weather changes.       Objective:     BP 140/72   Pulse 90   Temp 98.2 F (36.8 C) (Temporal)   Ht 5' (1.524 m)   Wt 120 lb 4 oz (54.5  kg)   SpO2 99% Comment: 2L of O2  BMI 23.48 kg/m  Wt Readings from Last 3 Encounters:  03/15/22 120 lb 4 oz (54.5 kg)  10/12/21 113 lb 6 oz (51.4 kg)  01/18/21 102 lb (46.3 kg)    Physical Exam   Gen: WDWN NAD AAF w/o2 HEENT: NCAT, conjunctiva not injected, sclera nonicteric NECK:  supple, no thyromegaly, no nodes, no carotid bruits CARDIAC: RRR, S1S2+, no murmur. DP 2+B LUNGS: CTAB. No wheezes.  distant ABDOMEN:  BS+, soft, NTND, No HSM, no masses EXT:  no edema MSK: no gross abnormalities.  NEURO: A&O x3.  CN II-XII intact.  PSYCH: normal mood. Good eye contact     Assessment & Plan:   Problem List Items Addressed This Visit       Cardiovascular and Mediastinum   HTN (hypertension) - Primary   Relevant Orders   Comprehensive  metabolic panel   Magnesium   CBC with Differential/Platelet     Other   Depression   Other Visit Diagnoses     Other dietary vitamin B12 deficiency anemia       Relevant Orders   Vitamin B12      HTN-chronic.  Controlled on hctz 25mg .  Cont to monitor.  Check cbc,cmp,mg HLD-chronic. Controlled on Atorvastatin 20mg .  Check lft's B12 deficiency anemia-on OTC.  Chek B12 and cbc COPD-chronic. Severe.  Stable on meds.  O2 dependent.  Care per pulm Depression/anxiety-chronic.  Controlled.  Cont buspar 5mg  and lexapro 5mg .  Fu 1mo  No orders of the defined types were placed in this encounter.   , MD

## 2022-03-15 NOTE — Telephone Encounter (Signed)
LMOVM advising patient that one of her tubes clotted and she needs to come back in for a redraw.  When making appt, please put light blue/lav in the appt note.

## 2022-04-02 ENCOUNTER — Other Ambulatory Visit: Payer: Self-pay | Admitting: Family Medicine

## 2022-04-09 ENCOUNTER — Other Ambulatory Visit: Payer: Self-pay | Admitting: Family Medicine

## 2022-04-26 ENCOUNTER — Other Ambulatory Visit: Payer: Self-pay | Admitting: *Deleted

## 2022-04-26 MED ORDER — SOLIFENACIN SUCCINATE 5 MG PO TABS: 5.0000 mg | ORAL_TABLET | Freq: Every day | ORAL | 3 refills | Status: DC

## 2022-05-04 ENCOUNTER — Other Ambulatory Visit: Payer: Self-pay | Admitting: Family Medicine

## 2022-06-25 ENCOUNTER — Other Ambulatory Visit: Payer: Self-pay | Admitting: Family Medicine

## 2022-07-06 ENCOUNTER — Other Ambulatory Visit: Payer: Self-pay | Admitting: Family Medicine

## 2022-08-18 ENCOUNTER — Other Ambulatory Visit: Payer: Self-pay | Admitting: Family Medicine

## 2022-09-14 ENCOUNTER — Ambulatory Visit: Payer: Medicare PPO | Admitting: Family Medicine

## 2022-09-26 ENCOUNTER — Other Ambulatory Visit: Payer: Self-pay | Admitting: Family Medicine

## 2022-09-26 MED ORDER — POTASSIUM CHLORIDE ER 20 MEQ PO TBCR: 20.0000 meq | EXTENDED_RELEASE_TABLET | Freq: Three times a day (TID) | ORAL | 0 refills | Status: DC

## 2022-09-28 ENCOUNTER — Ambulatory Visit: Payer: Medicare PPO | Admitting: Family Medicine

## 2022-11-06 ENCOUNTER — Other Ambulatory Visit: Payer: Self-pay | Admitting: Family Medicine

## 2022-11-07 NOTE — Telephone Encounter (Signed)
Pt scheduled for 2/9

## 2022-11-11 ENCOUNTER — Ambulatory Visit: Payer: Medicare PPO | Admitting: Family Medicine

## 2022-11-15 ENCOUNTER — Ambulatory Visit: Payer: Medicare PPO | Admitting: Family Medicine

## 2022-11-21 ENCOUNTER — Ambulatory Visit: Payer: Medicare PPO | Admitting: Family Medicine

## 2022-12-03 ENCOUNTER — Other Ambulatory Visit: Payer: Self-pay | Admitting: Family Medicine

## 2022-12-03 NOTE — Telephone Encounter (Signed)
Needs appt

## 2022-12-05 NOTE — Telephone Encounter (Signed)
Sch for 3/15 at 11 am

## 2022-12-16 ENCOUNTER — Encounter: Payer: Self-pay | Admitting: Family Medicine

## 2022-12-16 ENCOUNTER — Ambulatory Visit (INDEPENDENT_AMBULATORY_CARE_PROVIDER_SITE_OTHER): Payer: Medicare PPO | Admitting: Family Medicine

## 2022-12-16 VITALS — BP 120/70 | HR 102 | Temp 97.5°F | Ht 60.0 in | Wt 108.4 lb

## 2022-12-16 DIAGNOSIS — F324 Major depressive disorder, single episode, in partial remission: Secondary | ICD-10-CM | POA: Diagnosis not present

## 2022-12-16 DIAGNOSIS — I10 Essential (primary) hypertension: Secondary | ICD-10-CM | POA: Diagnosis not present

## 2022-12-16 DIAGNOSIS — D513 Other dietary vitamin B12 deficiency anemia: Secondary | ICD-10-CM

## 2022-12-16 DIAGNOSIS — J441 Chronic obstructive pulmonary disease with (acute) exacerbation: Secondary | ICD-10-CM

## 2022-12-16 DIAGNOSIS — R052 Subacute cough: Secondary | ICD-10-CM

## 2022-12-16 DIAGNOSIS — N3281 Overactive bladder: Secondary | ICD-10-CM

## 2022-12-16 LAB — LIPID PANEL
Cholesterol: 170 mg/dL (ref 0–200)
HDL: 53.8 mg/dL (ref >39.00)
LDL Cholesterol: 94 mg/dL (ref 0–99)
NonHDL: 116.43
Total CHOL/HDL Ratio: 3
Triglycerides: 110 mg/dL (ref 0.0–149.0)
VLDL: 22 mg/dL (ref 0.0–40.0)

## 2022-12-16 LAB — COMPREHENSIVE METABOLIC PANEL
ALT: 13 U/L (ref 0–35)
AST: 20 U/L (ref 0–37)
Albumin: 3.8 g/dL (ref 3.5–5.2)
Alkaline Phosphatase: 60 U/L (ref 39–117)
BUN: 9 mg/dL (ref 6–23)
CO2: 35 mEq/L — ABNORMAL HIGH (ref 19–32)
Calcium: 10 mg/dL (ref 8.4–10.5)
Chloride: 97 mEq/L (ref 96–112)
Creatinine, Ser: 0.59 mg/dL (ref 0.40–1.20)
GFR: 92.89 mL/min (ref >60.00)
Glucose, Bld: 101 mg/dL — ABNORMAL HIGH (ref 70–99)
Potassium: 4.3 mEq/L (ref 3.5–5.1)
Sodium: 140 mEq/L (ref 135–145)
Total Bilirubin: 0.3 mg/dL (ref 0.2–1.2)
Total Protein: 6.8 g/dL (ref 6.0–8.3)

## 2022-12-16 LAB — IBC + FERRITIN
Ferritin: 22.7 ng/mL (ref 10.0–291.0)
Iron: 93 ug/dL (ref 42–145)
Saturation Ratios: 26.6 % (ref 20.0–50.0)
TIBC: 350 ug/dL (ref 250.0–450.0)
Transferrin: 250 mg/dL (ref 212.0–360.0)

## 2022-12-16 LAB — VITAMIN B12: Vitamin B-12: >1500 pg/mL — ABNORMAL HIGH (ref 211–911)

## 2022-12-16 LAB — CBC WITH DIFFERENTIAL/PLATELET
Basophils Absolute: 0.1 10*3/uL (ref 0.0–0.1)
Basophils Relative: 0.8 % (ref 0.0–3.0)
Eosinophils Absolute: 0.1 10*3/uL (ref 0.0–0.7)
Eosinophils Relative: 1 % (ref 0.0–5.0)
HCT: 35.5 % — ABNORMAL LOW (ref 36.0–46.0)
Hemoglobin: 11.3 g/dL — ABNORMAL LOW (ref 12.0–15.0)
Lymphocytes Relative: 12.5 % (ref 12.0–46.0)
Lymphs Abs: 0.9 10*3/uL (ref 0.7–4.0)
MCHC: 31.7 g/dL (ref 30.0–36.0)
MCV: 86.2 fl (ref 78.0–100.0)
Monocytes Absolute: 0.5 10*3/uL (ref 0.1–1.0)
Monocytes Relative: 7.8 % (ref 3.0–12.0)
Neutro Abs: 5.4 10*3/uL (ref 1.4–7.7)
Neutrophils Relative %: 77.9 % — ABNORMAL HIGH (ref 43.0–77.0)
Platelets: 427 10*3/uL — ABNORMAL HIGH (ref 150.0–400.0)
RBC: 4.11 Mil/uL (ref 3.87–5.11)
RDW: 14.1 % (ref 11.5–15.5)
WBC: 7 10*3/uL (ref 4.0–10.5)

## 2022-12-16 LAB — MAGNESIUM: Magnesium: 1.8 mg/dL (ref 1.5–2.5)

## 2022-12-16 LAB — TSH: TSH: 1.05 u[IU]/mL (ref 0.35–5.50)

## 2022-12-16 LAB — HEMOGLOBIN A1C: Hgb A1c MFr Bld: 5.9 % (ref 4.6–6.5)

## 2022-12-16 MED ORDER — ESCITALOPRAM OXALATE 5 MG PO TABS: 5.0000 mg | ORAL_TABLET | Freq: Every day | ORAL | 1 refills | Status: DC

## 2022-12-16 MED ORDER — POTASSIUM CHLORIDE ER 20 MEQ PO TBCR: 20.0000 meq | EXTENDED_RELEASE_TABLET | Freq: Three times a day (TID) | ORAL | 0 refills | Status: DC

## 2022-12-16 MED ORDER — HYDROCHLOROTHIAZIDE 25 MG PO TABS: 25.0000 mg | ORAL_TABLET | Freq: Every day | ORAL | 3 refills | Status: DC

## 2022-12-16 MED ORDER — SOLIFENACIN SUCCINATE 5 MG PO TABS: 5.0000 mg | ORAL_TABLET | Freq: Every day | ORAL | 1 refills | Status: DC

## 2022-12-16 MED ORDER — BUSPIRONE HCL 5 MG PO TABS: 5.0000 mg | ORAL_TABLET | Freq: Two times a day (BID) | ORAL | 1 refills | Status: DC

## 2022-12-16 MED ORDER — AZITHROMYCIN 250 MG PO TABS: ORAL_TABLET | ORAL | 0 refills | Status: AC

## 2022-12-16 NOTE — Progress Notes (Unsigned)
Subjective:     Patient ID: Carla Rivera, female    DOB: 02/26/1956, 67 y.o.   MRN: [id number]  Chief Complaint  Patient presents with   Follow-up    Medication follow-up Fasting On going cough with mucus    HPI-here w/son.   Has home nurse yearly  Cough w/mucus-long time-seeing pulm.  Had URI 1 mo ago and cough lingering.  No inc sob.  No f/c.  Allergies starting now HTN-Pt is on hctz-occ takes 2 if "fluid"-about every other wk.  Bp's running       .  No ha/dizziness/cp/palp.  Taking K bid  HLD-stopped atorvastatin several months ago-caused heartburn.  On OTC and same Depression/anx.  Doing well on lexapro and buspar.  No SI OAB-vesicare working well.   Health Maintenance Due  Topic Date Due   COVID-19 Vaccine (1) Never done   Hepatitis C Screening  Never done   DTaP/Tdap/Td (1 - Tdap) Never done   Zoster Vaccines- Shingrix (1 of 2) Never done   INFLUENZA VACCINE  Never done   Medicare Annual Wellness (AWV)  02/09/2023    Past Medical History:  Diagnosis Date   Arthritis    COPD (chronic obstructive pulmonary disease) ([facility])    Depression    HTN (hypertension)     Past Surgical History:  Procedure Laterality Date   ECTOPIC PREGNANCY SURGERY      Outpatient Medications Prior to Visit  Medication Sig Dispense Refill   albuterol (PROVENTIL HFA;VENTOLIN HFA) 108 (90 Base) MCG/ACT inhaler Inhale 2 puffs into the lungs every 6 (six) hours as needed for wheezing or shortness of breath. 1 Inhaler 2   aspirin EC 81 MG tablet Take 81 mg by mouth daily. Swallow whole.     atorvastatin (LIPITOR) 20 MG tablet TAKE 1 TABLET BY MOUTH AT BEDTIME DAILY FOR CHOLESTEROL 90 tablet 3   budesonide-formoterol (SYMBICORT) 160-4.5 MCG/ACT inhaler Inhale 2 puffs into the lungs 2 (two) times daily. 1 Inhaler 12   busPIRone (BUSPAR) 5 MG tablet TAKE 1 TABLET BY MOUTH TWICE A DAY 180 tablet 1   escitalopram (LEXAPRO) 5 MG tablet TAKE 1 TABLET (5 MG TOTAL) BY MOUTH DAILY. 30 tablet 0    hydrochlorothiazide (HYDRODIURIL) 25 MG tablet TAKE 1 TABLET BY MOUTH EVERY DAY FOR HIGH BLOOD PRESSURE 90 tablet 3   ipratropium-albuterol (DUONEB) 0.5-2.5 (3) MG/3ML SOLN Inhale into the lungs.     nystatin (MYCOSTATIN) 100000 UNIT/ML suspension Take 5 mLs (500,000 Units total) by mouth 4 (four) times daily. 60 mL 0   Potassium Chloride ER 20 MEQ TBCR Take 20 mEq by mouth 3 (three) times daily. 270 tablet 0   solifenacin (VESICARE) 5 MG tablet TAKE 1 TABLET (5 MG TOTAL) BY MOUTH DAILY. 90 tablet 1   fluticasone (FLONASE) 50 MCG/ACT nasal spray Place into the nose.     ipratropium-albuterol (DUONEB) 0.5-2.5 (3) MG/3ML SOLN Inhale into the lungs.     No facility-administered medications prior to visit.    Allergies  Allergen Reactions   Penicillins Rash, Other (See Comments) and Hives    Has patient had a PCN reaction causing immediate rash, facial/tongue/throat swelling, SOB or lightheadedness with hypotension: No Has patient had a PCN reaction causing severe rash involving mucus membranes or skin necrosis: No Has patient had a PCN reaction that required hospitalization No Has patient had a PCN reaction occurring within the last 10 years: No If all of the above answers are "NO", then may proceed with Cephalosporin use.  ROS neg/noncontributory except as noted HPI/below  Some diarrhea last wk.  [place] now.       Objective:     BP 120/70   Pulse (!) 102   Temp (!) 97.5 F (36.4 C) (Temporal)   Ht 5' (1.524 m)   Wt 108 lb 6 oz (49.2 kg)   SpO2 94% Comment: 2l o2  BMI 21.17 kg/m  Wt Readings from Last 3 Encounters:  12/16/22 108 lb 6 oz (49.2 kg)  03/15/22 120 lb 4 oz (54.5 kg)  10/12/21 113 lb 6 oz (51.4 kg)    Physical Exam   Gen: WDWN NAD. Thin AAF in w/c w/O2. HEENT: NCAT, conjunctiva not injected, sclera nonicteric NECK:  supple, no thyromegaly, no nodes, no carotid bruits CARDIAC: RRR, S1S2+, no murmur. DP 2+B LUNGS: CTAB. No wheezes ABDOMEN:  BS+, soft, NTND, No  HSM, no masses EXT:  no edema MSK: no gross abnormalities.  NEURO: A&O x3.  CN II-XII intact.  PSYCH: normal mood. Good eye contact     Assessment & Plan:   Problem List Items Addressed This Visit   None   No orders of the defined types were placed in this encounter.   Wellington Hampshire, MD

## 2022-12-16 NOTE — Patient Instructions (Signed)

## 2022-12-18 DIAGNOSIS — N3281 Overactive bladder: Secondary | ICD-10-CM | POA: Insufficient documentation

## 2022-12-18 NOTE — Progress Notes (Signed)
Labs are normal/stable for her.  Potassium is where it should be so I believe she was taking it twice a day.  Continue doing that (please verify if she was taking 2 or 3 a day) Slight anemia-iron is on the low end of normal.  Take a vitamin with iron

## 2022-12-22 ENCOUNTER — Telehealth: Payer: Self-pay | Admitting: Family Medicine

## 2022-12-22 NOTE — Telephone Encounter (Signed)
Patient requests to be advised regarding: Patient states she is taking Geritol for low iron and wants to know if she should continue taking B12 (with Geritol)

## 2022-12-22 NOTE — Telephone Encounter (Signed)
Please advise 

## 2022-12-22 NOTE — Telephone Encounter (Signed)
Patient notified and verbalized understanding. 

## 2023-02-01 DIAGNOSIS — J949 Pleural condition, unspecified: Secondary | ICD-10-CM | POA: Diagnosis not present

## 2023-03-02 ENCOUNTER — Ambulatory Visit (INDEPENDENT_AMBULATORY_CARE_PROVIDER_SITE_OTHER): Payer: Medicare PPO

## 2023-03-02 VITALS — Wt 108.0 lb

## 2023-03-02 DIAGNOSIS — Z0000 Encounter for general adult medical examination without abnormal findings: Secondary | ICD-10-CM

## 2023-03-02 NOTE — Progress Notes (Signed)
I connected with  Nancee Liter on 03/02/23 by a audio enabled telemedicine application and verified that I am speaking with the correct person using two identifiers.  Patient Location: Home  Provider Location: Office/Clinic  I discussed the limitations of evaluation and management by telemedicine. The patient expressed understanding and agreed to proceed.   Subjective:   Carla Rivera is a 67 y.o. female who presents for Medicare Annual (Subsequent) preventive examination.  Review of Systems     Cardiac Risk Factors include: advanced age (>51men, >78 women);sedentary lifestyle;hypertension     Objective:    Today's Vitals   03/02/23 1500  Weight: 108 lb (49 kg)   Body mass index is 21.09 kg/m.     03/02/2023    3:05 PM 02/08/2022   11:08 AM 04/23/2020    6:00 AM 04/22/2020    6:51 AM 09/06/2017    3:38 PM 12/12/2016    1:00 AM 12/11/2016    7:09 PM  Advanced Directives  Does Patient Have a Medical Advance Directive? No Yes No No No No No  Type of Energy manager of Healthcare Power of Attorney in Chart?  No - copy requested       Would patient like information on creating a medical advance directive? No - Patient declined   No - Patient declined  No - Patient declined No - Patient declined    Current Medications (verified) Outpatient Encounter Medications as of 03/02/2023  Medication Sig   albuterol (PROVENTIL HFA;VENTOLIN HFA) 108 (90 Base) MCG/ACT inhaler Inhale 2 puffs into the lungs every 6 (six) hours as needed for wheezing or shortness of breath.   aspirin EC 81 MG tablet Take 81 mg by mouth daily. Swallow whole.   budesonide-formoterol (SYMBICORT) 160-4.5 MCG/ACT inhaler Inhale 2 puffs into the lungs 2 (two) times daily.   busPIRone (BUSPAR) 5 MG tablet Take 1 tablet (5 mg total) by mouth 2 (two) times daily.   escitalopram (LEXAPRO) 5 MG tablet Take 1 tablet (5 mg total) by mouth daily.   hydrochlorothiazide  (HYDRODIURIL) 25 MG tablet Take 1 tablet (25 mg total) by mouth daily. for high blood pressure   ipratropium-albuterol (DUONEB) 0.5-2.5 (3) MG/3ML SOLN Inhale into the lungs.   nystatin (MYCOSTATIN) 100000 UNIT/ML suspension Take 5 mLs (500,000 Units total) by mouth 4 (four) times daily.   Potassium Chloride ER 20 MEQ TBCR Take 1 tablet (20 mEq total) by mouth 3 (three) times daily.   solifenacin (VESICARE) 5 MG tablet Take 1 tablet (5 mg total) by mouth daily.   fluticasone (FLONASE) 50 MCG/ACT nasal spray Place into the nose.   No facility-administered encounter medications on file as of 03/02/2023.    Allergies (verified) Penicillins   History: Past Medical History:  Diagnosis Date   Arthritis    COPD (chronic obstructive pulmonary disease) (HCC)    Depression    HTN (hypertension)    Past Surgical History:  Procedure Laterality Date   ECTOPIC PREGNANCY SURGERY     Family History  Problem Relation Age of Onset   Renal Disease Mother    Hypertension Mother    Cancer Brother    Social History   Socioeconomic History   Marital status: Single    Spouse name: Not on file   Number of children: 3   Years of education: Not on file   Highest education level: Not on file  Occupational History   Not on file  Tobacco Use   Smoking status: Former    Types: Cigarettes   Smokeless tobacco: Never  Vaping Use   Vaping Use: Never used  Substance and Sexual Activity   Alcohol use: Not Currently    Alcohol/week: 2.0 standard drinks of alcohol    Types: 1 Glasses of wine, 1 Cans of beer per week   Drug use: Never   Sexual activity: Not Currently  Other Topics Concern   Not on file  Social History Narrative   Not on file   Social Determinants of Health   Financial Resource Strain: Low Risk  (03/02/2023)   Overall Financial Resource Strain (CARDIA)    Difficulty of Paying Living Expenses: Not hard at all  Food Insecurity: No Food Insecurity (03/02/2023)   Hunger Vital Sign     Worried About Running Out of Food in the Last Year: Never true    Ran Out of Food in the Last Year: Never true  Transportation Needs: No Transportation Needs (03/02/2023)   PRAPARE - Administrator, Civil Service (Medical): No    Lack of Transportation (Non-Medical): No  Physical Activity: Inactive (03/02/2023)   Exercise Vital Sign    Days of Exercise per Week: 0 days    Minutes of Exercise per Session: 0 min  Stress: No Stress Concern Present (03/02/2023)   [employer] of Occupational Health - Occupational Stress Questionnaire    Feeling of Stress : Not at all  Social Connections: Socially Isolated (03/02/2023)   Social Connection and Isolation Panel [NHANES]    Frequency of Communication with Friends and Family: More than three times a week    Frequency of Social Gatherings with Friends and Family: Once a week    Attends Religious Services: Never    Database administrator or Organizations: No    Attends Engineer, structural: Never    Marital Status: Never married    Tobacco Counseling Counseling given: Not Answered   Clinical Intake:  Pre-visit preparation completed: Yes  Pain : No/denies pain     BMI - recorded: 21.09 Nutritional Status: BMI of 19-24  Normal Nutritional Risks: None Diabetes: No  How often do you need to have someone help you when you read instructions, pamphlets, or other written materials from your doctor or pharmacy?: 1 - Never  Diabetic?no  Interpreter Needed?: No  Information entered by :: Lanier Ensign, LPN   Activities of Daily Living    03/02/2023    3:06 PM  In your present state of health, do you have any difficulty performing the following activities:  Hearing? 0  Vision? 0  Difficulty concentrating or making decisions? 0  Walking or climbing stairs? 0  Dressing or bathing? 0  Doing errands, shopping? 0  Preparing Food and eating ? N  Using the Toilet? N  In the past six months, have you accidently  leaked urine? N  Do you have problems with loss of bowel control? N  Managing your Medications? N  Managing your Finances? N  Housekeeping or managing your Housekeeping? N    Patient Care Team: Jeani Sow, MD as PCP - General (Family Medicine) Debbe Odea, MD as PCP - Cardiology (Cardiology)  Indicate any recent Medical Services you may have received from other than [facility] providers in the past year (date may be approximate).     Assessment:   This is a routine wellness examination for Roshaunda.  Hearing/Vision screen Hearing Screening - Comments:: Pt denies any hearing issues Vision  Screening - Comments:: Pt follows up with provider in [place]  Dietary issues and exercise activities discussed: Current Exercise Habits: The patient does not participate in regular exercise at present   Goals Addressed             This Visit's Progress    Patient Stated       Get off  oxygen        Depression Screen    03/02/2023    3:04 PM 12/16/2022   11:21 AM 03/15/2022   10:28 AM 02/08/2022   11:07 AM 10/12/2021   10:23 AM  PHQ 2/9 Scores  PHQ - 2 Score 0 0 2 0 4  PHQ- 9 Score  3 3  11     Fall Risk    03/02/2023    3:06 PM 12/16/2022   11:21 AM 03/15/2022   10:17 AM 02/08/2022   11:09 AM  Fall Risk   Falls in the past year? 0 0 0 0  Number falls in past yr: 0 0 0 0  Injury with Fall? 0 0 0 0  Risk for fall due to : Impaired vision No Fall Risks Impaired vision Impaired vision  Follow up Falls prevention discussed Falls evaluation completed Falls evaluation completed Falls prevention discussed    FALL RISK PREVENTION PERTAINING TO THE HOME:  Any stairs in or around the home? Yes  If so, are there any without handrails? No  Home free of loose throw rugs in walkways, pet beds, electrical cords, etc? Yes  Adequate lighting in your home to reduce risk of falls? Yes   ASSISTIVE DEVICES UTILIZED TO PREVENT FALLS:  Life alert? No  Use of a cane, walker or w/c? No   Grab bars in the bathroom? No  Shower chair or bench in shower? No  Elevated toilet seat or a handicapped toilet? No   TIMED UP AND GO:  Was the test performed? No .   Cognitive Function:        03/02/2023    3:07 PM 02/08/2022   11:10 AM  6CIT Screen  What Year? 0 points 0 points  What month? 0 points 0 points  What time? 0 points 0 points  Count back from 20 0 points 2 points  Months in reverse 4 points 4 points  Repeat phrase 4 points 2 points  Total Score 8 points 8 points    Immunizations  There is no immunization history on file for this patient.  TDAP status: Due, Education has been provided regarding the importance of this vaccine. Advised may receive this vaccine at local pharmacy or Health Dept. Aware to provide a copy of the vaccination record if obtained from local pharmacy or Health Dept. Verbalized acceptance and understanding.  Flu Vaccine status: Declined, Education has been provided regarding the importance of this vaccine but patient still declined. Advised may receive this vaccine at local pharmacy or Health Dept. Aware to provide a copy of the vaccination record if obtained from local pharmacy or Health Dept. Verbalized acceptance and understanding.  Pneumococcal vaccine status: Declined,  Education has been provided regarding the importance of this vaccine but patient still declined. Advised may receive this vaccine at local pharmacy or Health Dept. Aware to provide a copy of the vaccination record if obtained from local pharmacy or Health Dept. Verbalized acceptance and understanding.   Covid-19 vaccine status: Declined, Education has been provided regarding the importance of this vaccine but patient still declined. Advised may receive this vaccine at local pharmacy  or Health Dept.or vaccine clinic. Aware to provide a copy of the vaccination record if obtained from local pharmacy or Health Dept. Verbalized acceptance and understanding.  Qualifies for Shingles  Vaccine? Yes   Zostavax completed No   Shingrix Completed?: No.    Education has been provided regarding the importance of this vaccine. Patient has been advised to call insurance company to determine out of pocket expense if they have not yet received this vaccine. Advised may also receive vaccine at local pharmacy or Health Dept. Verbalized acceptance and understanding.  Screening Tests Health Maintenance  Topic Date Due   Hepatitis C Screening  Never done   DTaP/Tdap/Td (1 - Tdap) Never done   Zoster Vaccines- Shingrix (1 of 2) 03/18/2023 (Originally 12/23/2004)   Pneumonia Vaccine 57+ Years old (1 of 2 - PCV) 03/01/2024 (Originally [birth year]/23/1960)   MAMMOGRAM  03/01/2024 (Originally 12/23/2004)   DEXA SCAN  03/01/2024 (Originally 12/24/2019)   Colonoscopy  03/01/2024 (Originally 12/24/1999)   INFLUENZA VACCINE  05/04/2023   Medicare Annual Wellness (AWV)  03/01/2024   HPV VACCINES  Aged Out   COVID-19 Vaccine  Discontinued    Health Maintenance  Health Maintenance Due  Topic Date Due   Hepatitis C Screening  Never done   DTaP/Tdap/Td (1 - Tdap) Never done     Pt declined colonoscopy, mammogram and bone density at this time, Stating "not until she get her panic attacks taken care of"     Additional Screening:  Hepatitis C Screening: does qualify  Vision Screening: Recommended annual ophthalmology exams for early detection of glaucoma and other disorders of the eye. Is the patient up to date with their annual eye exam?  No  Who is the provider or what is the name of the office in which the patient attends annual eye exams? [facility] provider  If pt is not established with a provider, would they like to be referred to a provider to establish care? No .   Dental Screening: Recommended annual dental exams for proper oral hygiene  Community Resource Referral / Chronic Care Management: CRR required this visit?  No   CCM required this visit?  No      Plan:     I have  personally reviewed and noted the following in the patient's chart:   Medical and social history Use of alcohol, tobacco or illicit drugs  Current medications and supplements including opioid prescriptions. Patient is not currently taking opioid prescriptions. Functional ability and status Nutritional status Physical activity Advanced directives List of other physicians Hospitalizations, surgeries, and ER visits in previous 12 months Vitals Screenings to include cognitive, depression, and falls Referrals and appointments  In addition, I have reviewed and discussed with patient certain preventive protocols, quality metrics, and best practice recommendations. A written personalized care plan for preventive services as well as general preventive health recommendations were provided to patient.     Marzella Schlein, LPN   1/61/0960   Nurse Notes: Pt wants to inform PCP she has been having panic attacks and wanted increase in medications to see if it would help. Pt wants to be contacted

## 2023-03-02 NOTE — Patient Instructions (Signed)
Carla Rivera , Thank you for taking time to come for your Medicare Wellness Visit. I appreciate your ongoing commitment to your health goals. Please review the following plan we discussed and let me know if I can assist you in the future.   These are the goals we discussed:  Goals      Patient Stated     None at this time      Patient Stated     Get off  oxygen         This is a list of the screening recommended for you and due dates:  Health Maintenance  Topic Date Due   COVID-19 Vaccine (1) Never done   Pneumonia Vaccine (1 of 2 - PCV) Never done   Hepatitis C Screening  Never done   DTaP/Tdap/Td vaccine (1 - Tdap) Never done   Colon Cancer Screening  Never done   Mammogram  Never done   DEXA scan (bone density measurement)  Never done   Zoster (Shingles) Vaccine (1 of 2) 03/18/2023*   Flu Shot  05/04/2023   Medicare Annual Wellness Visit  03/01/2024   HPV Vaccine  Aged Out  *Topic was postponed. The date shown is not the original due date.    Advanced directives: Advance directive discussed with you today. Even though you declined this today please call our office should you change your mind and we can give you the proper paperwork for you to fill out.  Conditions/risks identified: get off oxygen   Next appointment: Follow up in one year for your annual wellness visit    Preventive Care 65 Years and Older, Female Preventive care refers to lifestyle choices and visits with your health care provider that can promote health and wellness. What does preventive care include? A yearly physical exam. This is also called an annual well check. Dental exams once or twice a year. Routine eye exams. Ask your health care provider how often you should have your eyes checked. Personal lifestyle choices, including: Daily care of your teeth and gums. Regular physical activity. Eating a healthy diet. Avoiding tobacco and drug use. Limiting alcohol use. Practicing safe sex. Taking  low-dose aspirin every day. Taking vitamin and mineral supplements as recommended by your health care provider. What happens during an annual well check? The services and screenings done by your health care provider during your annual well check will depend on your age, overall health, lifestyle risk factors, and family history of disease. Counseling  Your health care provider may ask you questions about your: Alcohol use. Tobacco use. Drug use. Emotional well-being. Home and relationship well-being. Sexual activity. Eating habits. History of falls. Memory and ability to understand (cognition). Work and work Astronomer. Reproductive health. Screening  You may have the following tests or measurements: Height, weight, and BMI. Blood pressure. Lipid and cholesterol levels. These may be checked every 5 years, or more frequently if you are over 29 years old. Skin check. Lung cancer screening. You may have this screening every year starting at age 62 if you have a 30-pack-year history of smoking and currently smoke or have quit within the past 15 years. Fecal occult blood test (FOBT) of the stool. You may have this test every year starting at age 28. Flexible sigmoidoscopy or colonoscopy. You may have a sigmoidoscopy every 5 years or a colonoscopy every 10 years starting at age 55. Hepatitis C blood test. Hepatitis B blood test. Sexually transmitted disease (STD) testing. Diabetes screening. This is done by  checking your blood sugar (glucose) after you have not eaten for a while (fasting). You may have this done every 1-3 years. Bone density scan. This is done to screen for osteoporosis. You may have this done starting at age 38. Mammogram. This may be done every 1-2 years. Talk to your health care provider about how often you should have regular mammograms. Talk with your health care provider about your test results, treatment options, and if necessary, the need for more tests. Vaccines   Your health care provider may recommend certain vaccines, such as: Influenza vaccine. This is recommended every year. Tetanus, diphtheria, and acellular pertussis (Tdap, Td) vaccine. You may need a Td booster every 10 years. Zoster vaccine. You may need this after age 32. Pneumococcal 13-valent conjugate (PCV13) vaccine. One dose is recommended after age 42. Pneumococcal polysaccharide (PPSV23) vaccine. One dose is recommended after age 61. Talk to your health care provider about which screenings and vaccines you need and how often you need them. This information is not intended to replace advice given to you by your health care provider. Make sure you discuss any questions you have with your health care provider. Document Released: 10/16/2015 Document Revised: 06/08/2016 Document Reviewed: 07/21/2015 Elsevier Interactive Patient Education  2017 [employer].  Fall Prevention in the Home Falls can cause injuries. They can happen to people of all ages. There are many things you can do to make your home safe and to help prevent falls. What can I do on the outside of my home? Regularly fix the edges of walkways and driveways and fix any cracks. Remove anything that might make you trip as you walk through a door, such as a raised step or threshold. Trim any bushes or trees on the path to your home. Use bright outdoor lighting. Clear any walking paths of anything that might make someone trip, such as rocks or tools. Regularly check to see if handrails are loose or broken. Make sure that both sides of any steps have handrails. Any raised decks and porches should have guardrails on the edges. Have any leaves, snow, or ice cleared regularly. Use sand or salt on walking paths during winter. Clean up any spills in your garage right away. This includes oil or grease spills. What can I do in the bathroom? Use night lights. Install grab bars by the toilet and in the tub and shower. Do not use towel  bars as grab bars. Use non-skid mats or decals in the tub or shower. If you need to sit down in the shower, use a plastic, non-slip stool. Keep the floor dry. Clean up any water that spills on the floor as soon as it happens. Remove soap buildup in the tub or shower regularly. Attach bath mats securely with double-sided non-slip rug tape. Do not have throw rugs and other things on the floor that can make you trip. What can I do in the bedroom? Use night lights. Make sure that you have a light by your bed that is easy to reach. Do not use any sheets or blankets that are too big for your bed. They should not hang down onto the floor. Have a firm chair that has side arms. You can use this for support while you get dressed. Do not have throw rugs and other things on the floor that can make you trip. What can I do in the kitchen? Clean up any spills right away. Avoid walking on wet floors. Keep items that you use a  lot in easy-to-reach places. If you need to reach something above you, use a strong step stool that has a grab bar. Keep electrical cords out of the way. Do not use floor polish or wax that makes floors slippery. If you must use wax, use non-skid floor wax. Do not have throw rugs and other things on the floor that can make you trip. What can I do with my stairs? Do not leave any items on the stairs. Make sure that there are handrails on both sides of the stairs and use them. Fix handrails that are broken or loose. Make sure that handrails are as long as the stairways. Check any carpeting to make sure that it is firmly attached to the stairs. Fix any carpet that is loose or worn. Avoid having throw rugs at the top or bottom of the stairs. If you do have throw rugs, attach them to the floor with carpet tape. Make sure that you have a light switch at the top of the stairs and the bottom of the stairs. If you do not have them, ask someone to add them for you. What else can I do to help  prevent falls? Wear shoes that: Do not have high heels. Have rubber bottoms. Are comfortable and fit you well. Are closed at the toe. Do not wear sandals. If you use a stepladder: Make sure that it is fully opened. Do not climb a closed stepladder. Make sure that both sides of the stepladder are locked into place. Ask someone to hold it for you, if possible. Clearly mark and make sure that you can see: Any grab bars or handrails. First and last steps. Where the edge of each step is. Use tools that help you move around (mobility aids) if they are needed. These include: Canes. Walkers. Scooters. Crutches. Turn on the lights when you go into a dark area. Replace any light bulbs as soon as they burn out. Set up your furniture so you have a clear path. Avoid moving your furniture around. If any of your floors are uneven, fix them. If there are any pets around you, be aware of where they are. Review your medicines with your doctor. Some medicines can make you feel dizzy. This can increase your chance of falling. Ask your doctor what other things that you can do to help prevent falls. This information is not intended to replace advice given to you by your health care provider. Make sure you discuss any questions you have with your health care provider. Document Released: 07/16/2009 Document Revised: 02/25/2016 Document Reviewed: 10/24/2014 Elsevier Interactive Patient Education  2017 [employer].

## 2023-03-04 DIAGNOSIS — J949 Pleural condition, unspecified: Secondary | ICD-10-CM | POA: Diagnosis not present

## 2023-03-08 ENCOUNTER — Ambulatory Visit: Payer: Medicare PPO | Admitting: Family Medicine

## 2023-03-29 ENCOUNTER — Other Ambulatory Visit: Payer: Self-pay | Admitting: Family Medicine

## 2023-04-07 ENCOUNTER — Telehealth: Payer: Self-pay | Admitting: Family Medicine

## 2023-04-07 NOTE — Telephone Encounter (Signed)
Form faxed

## 2023-04-07 NOTE — Telephone Encounter (Signed)
Terri with [facility] states Patient is running low on oxygen in her portable concentrator (prescriber is Dr. [facility] who is on vacation ) and Patient is [place].  Camelia Eng states she is going fax Dr. Ruthine Dose a RX request for oxygen via fax if Dr. Ruthine Dose is able to sign RX.  Terri requests to be called to discuss if there are any questions.

## 2023-04-09 DIAGNOSIS — M1990 Unspecified osteoarthritis, unspecified site: Secondary | ICD-10-CM | POA: Diagnosis not present

## 2023-04-09 DIAGNOSIS — F410 Panic disorder [episodic paroxysmal anxiety] without agoraphobia: Secondary | ICD-10-CM | POA: Diagnosis not present

## 2023-04-09 DIAGNOSIS — J449 Chronic obstructive pulmonary disease, unspecified: Secondary | ICD-10-CM | POA: Diagnosis not present

## 2023-04-09 DIAGNOSIS — E7800 Pure hypercholesterolemia, unspecified: Secondary | ICD-10-CM | POA: Diagnosis not present

## 2023-04-09 DIAGNOSIS — Z888 Allergy status to other drugs, medicaments and biological substances status: Secondary | ICD-10-CM | POA: Diagnosis not present

## 2023-04-09 DIAGNOSIS — F411 Generalized anxiety disorder: Secondary | ICD-10-CM | POA: Diagnosis not present

## 2023-04-09 DIAGNOSIS — J441 Chronic obstructive pulmonary disease with (acute) exacerbation: Secondary | ICD-10-CM | POA: Diagnosis not present

## 2023-04-09 DIAGNOSIS — Z9981 Dependence on supplemental oxygen: Secondary | ICD-10-CM | POA: Diagnosis not present

## 2023-04-09 DIAGNOSIS — I10 Essential (primary) hypertension: Secondary | ICD-10-CM | POA: Diagnosis not present

## 2023-04-09 DIAGNOSIS — F32A Depression, unspecified: Secondary | ICD-10-CM | POA: Diagnosis not present

## 2023-04-10 DIAGNOSIS — I10 Essential (primary) hypertension: Secondary | ICD-10-CM | POA: Diagnosis not present

## 2023-04-10 DIAGNOSIS — F410 Panic disorder [episodic paroxysmal anxiety] without agoraphobia: Secondary | ICD-10-CM | POA: Diagnosis not present

## 2023-04-10 DIAGNOSIS — J441 Chronic obstructive pulmonary disease with (acute) exacerbation: Secondary | ICD-10-CM | POA: Diagnosis not present

## 2023-04-11 ENCOUNTER — Telehealth: Payer: Self-pay

## 2023-04-11 ENCOUNTER — Telehealth: Payer: Self-pay | Admitting: Family Medicine

## 2023-04-11 NOTE — Transitions of Care (Post Inpatient/ED Visit) (Signed)
   04/11/2023  Name: Carla Rivera MRN: [id number] DOB: [birth year]  Today's TOC FU Call Status: Today's TOC FU Call Status:: Unsuccessul Call (1st Attempt) Unsuccessful Call (1st Attempt) Date: 04/11/23  Attempted to reach the patient regarding the most recent Inpatient/ED visit.  Follow Up Plan: Additional outreach attempts will be made to reach the patient to complete the Transitions of Care (Post Inpatient/ED visit) call.      Antionette Fairy, RN,BSN,CCM [facility] Care Management Care Management Community Coordinator Direct Phone: [phone] Toll Free: [phone] Fax: [phone]

## 2023-04-11 NOTE — Telephone Encounter (Signed)
Patient states she was returning office's call. Requests callback if needed.

## 2023-04-12 ENCOUNTER — Telehealth: Payer: Self-pay

## 2023-04-12 NOTE — Transitions of Care (Post Inpatient/ED Visit) (Signed)
04/12/2023  Name: Carla Rivera MRN: [id number] DOB: 1955/11/05  Today's TOC FU Call Status: Today's TOC FU Call Status:: Successful TOC FU Call Competed TOC FU Call Complete Date: 04/12/23  Transition Care Management Follow-up Telephone Call Date of Discharge: 04/10/23 Discharge Facility: Other ([facility]) Name of Other ([facility]) Discharge Facility: [facility] Type of Discharge: Inpatient Admission Primary Inpatient Discharge Diagnosis:: No diagnosis found on [facility]/PING.  Pt reported she was admitted for anxiety and SOB- after travelling out of town to mountains in [place] altitudes caused her to have trouble breathing and experience anxiety attacks. How have you been since you were released from the hospital?: Better (Pt rpreorts she is not having any resp issues at present-wearing oxygen cont at 2.5L/min. Appetite is good. She has been up walking & moving around.) Any questions or concerns?: No  Items Reviewed: Did you receive and understand the discharge instructions provided?: Yes Medications obtained,verified, and reconciled?: Yes (Medications Reviewed) Any new allergies since your discharge?: No Dietary orders reviewed?: Yes Type of Diet Ordered:: low salt/heart healthy Do you have support at home?: Yes People in Home: child(ren), adult Name of Support/Comfort Primary Source: son helps her out as needed  Medications Reviewed Today: Medications Reviewed Today     Reviewed by Charlyn Minerva, RN (Registered Nurse) on 04/12/23 at 1005  Med List Status: <None>   Medication Order Taking? Sig Documenting Provider Last Dose Status Informant  albuterol (PROVENTIL HFA;VENTOLIN HFA) 108 (90 Base) MCG/ACT inhaler [id number] Yes Inhale 2 puffs into the lungs every 6 (six) hours as needed for wheezing or shortness of breath. Emily Filbert, MD Taking Active Self  aspirin EC 81 MG tablet [id number] Yes Take 81 mg by mouth daily. Swallow whole.  [provider] Taking Active Self  azithromycin (ZITHROMAX) 500 MG tablet [id number] Yes Take 500 mg by mouth daily. [provider] Taking Active Self  budesonide-formoterol (SYMBICORT) 160-4.5 MCG/ACT inhaler [id number] Yes Inhale 2 puffs into the lungs 2 (two) times daily. Emily Filbert, MD Taking Active Self  busPIRone (BUSPAR) 5 MG tablet [id number] Yes Take 1 tablet (5 mg total) by mouth 2 (two) times daily. Jeani Sow, MD Taking Active   escitalopram (LEXAPRO) 5 MG tablet [id number] Yes Take 1 tablet (5 mg total) by mouth daily.  Patient taking differently: Take 5 mg by mouth daily. Dosage increased to 10mg  on 04/10/23 per hospital d/c   Jeani Sow, MD Taking Active Self  fluticasone [facility]) 50 MCG/ACT nasal spray [id number]  Place into the nose. [provider]  Expired 10/21/22 2359   hydrochlorothiazide (HYDRODIURIL) 25 MG tablet [id number] Yes Take 1 tablet (25 mg total) by mouth daily. for high blood pressure Jeani Sow, MD Taking Active   ipratropium-albuterol (DUONEB) 0.5-2.5 (3) MG/3ML Criss Rosales [id number] Yes Inhale into the lungs. [provider] Taking Active   nystatin (MYCOSTATIN) 100000 UNIT/ML suspension [id number]  Take 5 mLs (500,000 Units total) by mouth 4 (four) times daily. Glynn Octave, MD  Active   Potassium Chloride ER 20 MEQ TBCR [id number] Yes Take 1 tablet (20 mEq total) by mouth 3 (three) times daily. Jeani Sow, MD Taking Active   predniSONE (STERAPRED UNI-PAK 21 TAB) 5 MG (21) TBPK tablet [id number] Yes Take 5 mg by mouth daily. [provider] Taking Active Self  solifenacin (VESICARE) 5 MG tablet [id number] Yes Take 1 tablet (5 mg total) by mouth daily. Jeani Sow, MD Taking Active  Home Care and Equipment/Supplies: Were Home Health Services Ordered?: NA Any new equipment or medical supplies ordered?: NA  Functional Questionnaire: Do you need assistance with  bathing/showering or dressing?: No Do you need assistance with meal preparation?: No Do you need assistance with eating?: No Do you have difficulty maintaining continence: No Do you need assistance with getting out of bed/getting out of a chair/moving?: No Do you have difficulty managing or taking your medications?: No  Follow up appointments reviewed: PCP Follow-up appointment confirmed?: Yes Date of PCP follow-up appointment?: 04/20/23 Follow-up Provider: Dr. Ruthine Dose Specialist [facility] Follow-up appointment confirmed?: No Reason Specialist Follow-Up Not Confirmed: Patient has Specialist Provider Number and will Call for Appointment (pt will call and make appt with lung MD) Do you need transportation to your follow-up appointment?: No (pt confirms her son takes her to appts) Do you understand care options if your condition(s) worsen?: Yes-patient verbalized understanding  SDOH Interventions Today    Flowsheet Row Most Recent Value  SDOH Interventions   Food Insecurity Interventions Intervention Not Indicated  Transportation Interventions Intervention Not Indicated       TOC Interventions Today    Flowsheet Row Most Recent Value  TOC Interventions   TOC Interventions Discussed/Reviewed TOC Interventions Discussed, Arranged PCP follow up less than 12 days/Care Guide scheduled      Interventions Today    Flowsheet Row Most Recent Value  Chronic Disease   Chronic disease during today's visit Chronic Obstructive Pulmonary Disease (COPD)  General Interventions   General Interventions Discussed/Reviewed General Interventions Discussed, Doctor Visits, Durable Medical Equipment (DME)  Doctor Visits Discussed/Reviewed Doctor Visits Discussed, PCP, Specialist  Durable Medical Equipment (DME) Oxygen  PCP/Specialist Visits Compliance with follow-up visit  Education Interventions   Education Provided Provided Education  Provided Verbal Education On Nutrition, When to see the doctor,  Other  [resp sx mgmt]  Nutrition Interventions   Nutrition Discussed/Reviewed Nutrition Discussed  Pharmacy Interventions   Pharmacy Dicussed/Reviewed Pharmacy Topics Discussed, Medications and their functions  Safety Interventions   Safety Discussed/Reviewed Safety Discussed        Alessandra Grout [facility] Care Management Care Management Community Coordinator Direct Phone: [phone] Toll Free: [phone] Fax: [phone]

## 2023-04-12 NOTE — Telephone Encounter (Signed)
Patient has made appt. Office was trying to call patient to schedule appt. Nothing further needed at this time.

## 2023-04-20 ENCOUNTER — Encounter: Payer: Self-pay | Admitting: Family Medicine

## 2023-04-20 ENCOUNTER — Ambulatory Visit (INDEPENDENT_AMBULATORY_CARE_PROVIDER_SITE_OTHER): Payer: Medicare PPO | Admitting: Family Medicine

## 2023-04-20 VITALS — BP 130/60 | HR 87 | Temp 98.5°F | Resp 16 | Ht 60.0 in | Wt 115.0 lb

## 2023-04-20 DIAGNOSIS — F419 Anxiety disorder, unspecified: Secondary | ICD-10-CM | POA: Diagnosis not present

## 2023-04-20 DIAGNOSIS — J441 Chronic obstructive pulmonary disease with (acute) exacerbation: Secondary | ICD-10-CM | POA: Diagnosis not present

## 2023-04-20 MED ORDER — LORAZEPAM 0.5 MG PO TABS: 0.5000 mg | ORAL_TABLET | Freq: Two times a day (BID) | ORAL | 0 refills | Status: DC | PRN

## 2023-04-20 MED ORDER — BUSPIRONE HCL 10 MG PO TABS: 10.0000 mg | ORAL_TABLET | Freq: Two times a day (BID) | ORAL | 0 refills | Status: DC

## 2023-04-20 NOTE — Progress Notes (Signed)
Subjective:     Patient ID: Carla Rivera, female    DOB: [birth year], 67 y.o.   MRN: [id number]  Chief Complaint  Patient presents with   Follow-up    Hospital follow-up from 04/10/23     HPI Here with her son, Marijean Niemann.   COPD flare up -ER(see below) Endorses shortness of breath with panic attacks. When admitted her SpO2 was <80% per pt. Her breathing has improved since her discharge. She was taking Prednisone 5 mg daily, which helped. Follows up with Dr. Meredeth Ide from pulmonology. Back to baseline.  Thinks flare from traveling, using [place] O2 portable tanks and had "taste" from them which set up panic. Her Portable O2 device broke so subbed the tanks Panic attacks - She was recently hospitalized from 7/7 to 7/9 at [facility] in [place] records available.  Pt and son good historians. Was having trouble with her oxygen tanks, was causing her to have panic attacks, shortness of breath, and overheating. She had about 3-4 panic attacks while in the car. At the hospital was having trouble getting her out of the car and into her wheelchair. She reports her panic attacks have been happening for some time now but tolerable until above.  She was started on Lorazepam 0.5 mg for anxiety attacks and works well. No SI.   Depression - She has been taking her Buspar 5 mg bid-thinks needs to be increased. Stopped taking Lexapro 10 mg due to it making her mouth dry. Since ER, Is now taking 5 mg of Lexapro at nighttime.   Health Maintenance Due  Topic Date Due   Hepatitis C Screening  Never done   DTaP/Tdap/Td (1 - Tdap) Never done    Past Medical History:  Diagnosis Date   Arthritis    COPD (chronic obstructive pulmonary disease) (HCC)    Depression    HTN (hypertension)     Past Surgical History:  Procedure Laterality Date   ECTOPIC PREGNANCY SURGERY       Current Outpatient Medications:    albuterol (PROVENTIL HFA;VENTOLIN HFA) 108 (90 Base) MCG/ACT inhaler, Inhale 2 puffs into the  lungs every 6 (six) hours as needed for wheezing or shortness of breath., Disp: 1 Inhaler, Rfl: 2   aspirin EC 81 MG tablet, Take 81 mg by mouth daily. Swallow whole., Disp: , Rfl:    budesonide-formoterol (SYMBICORT) 160-4.5 MCG/ACT inhaler, Inhale 2 puffs into the lungs 2 (two) times daily., Disp: 1 Inhaler, Rfl: 12   escitalopram (LEXAPRO) 10 MG tablet, Take 10 mg by mouth daily., Disp: , Rfl:    hydrochlorothiazide (HYDRODIURIL) 25 MG tablet, Take 1 tablet (25 mg total) by mouth daily. for high blood pressure, Disp: 90 tablet, Rfl: 3   ipratropium-albuterol (DUONEB) 0.5-2.5 (3) MG/3ML SOLN, Inhale into the lungs., Disp: , Rfl:    nystatin (MYCOSTATIN) 100000 UNIT/ML suspension, Take 5 mLs (500,000 Units total) by mouth 4 (four) times daily., Disp: 60 mL, Rfl: 0   Potassium Chloride ER 20 MEQ TBCR, Take 1 tablet (20 mEq total) by mouth 3 (three) times daily., Disp: 270 tablet, Rfl: 0   solifenacin (VESICARE) 5 MG tablet, Take 1 tablet (5 mg total) by mouth daily., Disp: 90 tablet, Rfl: 1   busPIRone (BUSPAR) 10 MG tablet, Take 1 tablet (10 mg total) by mouth 2 (two) times daily., Disp: 180 tablet, Rfl: 0   fluticasone (FLONASE) 50 MCG/ACT nasal spray, Place into the nose., Disp: , Rfl:    LORazepam (ATIVAN) 0.5 MG tablet, Take 1  tablet (0.5 mg total) by mouth 2 (two) times daily as needed., Disp: 30 tablet, Rfl: 0  Allergies  Allergen Reactions   Penicillins Rash, Other (See Comments) and Hives    Has patient had a PCN reaction causing immediate rash, facial/tongue/throat swelling, SOB or lightheadedness with hypotension: No Has patient had a PCN reaction causing severe rash involving mucus membranes or skin necrosis: No Has patient had a PCN reaction that required hospitalization No Has patient had a PCN reaction occurring within the last 10 years: No If all of the above answers are "NO", then may proceed with Cephalosporin use.    ROS neg/noncontributory except as noted HPI/below       Objective:     BP 130/60   Pulse 87   Temp 98.5 F (36.9 C) (Temporal)   Resp 16   Ht 5' (1.524 m)   Wt 115 lb (52.2 kg)   SpO2 94% Comment: 2l O2  BMI 22.46 kg/m  Wt Readings from Last 3 Encounters:  04/20/23 115 lb (52.2 kg)  03/02/23 108 lb (49 kg)  12/16/22 108 lb 6 oz (49.2 kg)    Physical Exam   Gen: WDWN NAD, frail female.  Looks older.  On O2 HEENT: NCAT, conjunctiva not injected, sclera nonicteric NECK:  supple, no thyromegaly, no nodes, no carotid bruits CARDIAC: RRR, S1S2+, no murmur. DP 2+B LUNGS: CTAB. No wheezes. Distant ABDOMEN:  BS+, soft, NTND, No HSM, no masses EXT:  no edema MSK: w/c  NEURO: A&O x3.  CN II-XII intact.  PSYCH: normal mood. Good eye contact  PDMP reviewed today, no red flags      Assessment & Plan:  COPD exacerbation (HCC)  Anxiety -     busPIRone HCl; Take 1 tablet (10 mg total) by mouth 2 (two) times daily.  Dispense: 180 tablet; Refill: 0 -     LORazepam; Take 1 tablet (0.5 mg total) by mouth 2 (two) times daily as needed.  Dispense: 30 tablet; Refill: 0   COPD exacerbation-resolved.  Suspect from heat, excitement, anxiety continue O2, meds, etc.  Anxiety-chronic.  Not ideally controlled.  Pt back on lexapro 5mg .  Will increase Buspar to 10mg  bid.  Rx for Lorazepam 0.5mg  written.  Discussed risk/benefits/dependence, etc.  Keep locked up.  Monitor use.  Understand that SOB very anxiety provoking.   Return for as sch in sept.   I,Rachel Rivera,acting as a scribe for Angelena Sole, MD.,have documented all relevant documentation on the behalf of Angelena Sole, MD,as directed by  Angelena Sole, MD while in the presence of Angelena Sole, MD.  I, Angelena Sole, MD, have reviewed all documentation for this visit. The documentation on 04/20/23 for the exam, diagnosis, procedures, and orders are all accurate and complete.   Angelena Sole, MD

## 2023-04-20 NOTE — Patient Instructions (Signed)
It was very nice to see you today!  Keep close eye on lorazepam use  Increase buspar to 10mg  twice daily   PLEASE NOTE:  If you had any lab tests please let us know if you have not heard back within a few days. You may see your results on MyChart before we have a chance to review them but we will give you a call once they are reviewed by [place]. If we ordered any referrals today, please let us know if you have not heard from their office within the next week.   Please try these tips to maintain a healthy lifestyle:  Eat most of your calories during the day when you are active. Eliminate processed foods including packaged sweets (pies, cakes, cookies), reduce intake of potatoes, white bread, white pasta, and white rice. Look for whole grain options, oat flour or almond flour.  Each meal should contain half fruits/vegetables, one quarter protein, and one quarter carbs (no bigger than a computer mouse).  Cut down on sweet beverages. This includes juice, soda, and sweet tea. Also watch fruit intake, though this is a healthier sweet option, it still contains natural sugar! Limit to 3 servings daily.  Drink at least 1 glass of water with each meal and aim for at least 8 glasses per day  Exercise at least 150 minutes every week.

## 2023-06-11 ENCOUNTER — Other Ambulatory Visit: Payer: Self-pay | Admitting: Family Medicine

## 2023-06-11 DIAGNOSIS — F419 Anxiety disorder, unspecified: Secondary | ICD-10-CM

## 2023-06-11 MED ORDER — BUSPIRONE HCL 10 MG PO TABS
10.0000 mg | ORAL_TABLET | Freq: Two times a day (BID) | ORAL | 0 refills | Status: DC
Start: 2023-06-11 — End: 2023-07-14

## 2023-06-11 MED ORDER — ESCITALOPRAM OXALATE 5 MG PO TABS: 5.0000 mg | ORAL_TABLET | Freq: Every day | ORAL | 0 refills | Status: DC

## 2023-06-12 ENCOUNTER — Ambulatory Visit: Payer: Medicare PPO | Admitting: Family Medicine

## 2023-07-13 NOTE — Progress Notes (Signed)
Subjective:     Patient ID: Carla Rivera, female    DOB: Jan 26, 1956, 67 y.o.   MRN: [id number]  Chief Complaint  Patient presents with   Medical Management of Chronic Issues    6 month follow-up  Fasting     HPI - here with her son, Marijean Niemann, and her grandson.   HTN - Pt is on hydrochlorothiazide 25 mg daily. Checking her BP at home, her son reports it ranges from 120-130/80s.  No ha/dizziness/cp/palp/edema.  Occ, feels like fluid building in lungs and will take extra.  Runs out  does this several times/month  COPD - Taking Symbicort daily and using her nebulizer nightly. Reports she no longer has an albuterol inhaler since her insurance stopped coverage for it. Takes an extra dose of hydrochlorothiazide for fluid in her lungs about 10 times a month. She states 30 fluid pills will last her about 3  months. Tends to feel an increase of fluid build up in lungs when she eats salty foods, which she enjoys, almost every other day. Has also been taking potassium twice daily, in the mornings and before bed.   Anxiety/Depression - She is managing her anxiety with lorazepam 0.5 mg BID-occassionally. Taking lexapro 5 mg once daily and buspirone 10 mg once daily, was unaware it was prescribed BID. No SI.  4.  OAB-doing well on meds   Health Maintenance Due  Topic Date Due   DTaP/Tdap/Td (1 - Tdap) Never done    Past Medical History:  Diagnosis Date   Arthritis    COPD (chronic obstructive pulmonary disease) (HCC)    Depression    HTN (hypertension)     Past Surgical History:  Procedure Laterality Date   ECTOPIC PREGNANCY SURGERY       Current Outpatient Medications:    aspirin EC 81 MG tablet, Take 81 mg by mouth daily. Swallow whole., Disp: , Rfl:    budesonide-formoterol (SYMBICORT) 160-4.5 MCG/ACT inhaler, Inhale 2 puffs into the lungs 2 (two) times daily., Disp: 1 Inhaler, Rfl: 12   ipratropium-albuterol (DUONEB) 0.5-2.5 (3) MG/3ML SOLN, Take 3 mLs by nebulization 4 (four)  times daily., Disp: , Rfl:    LORazepam (ATIVAN) 0.5 MG tablet, Take 1 tablet (0.5 mg total) by mouth 2 (two) times daily as needed., Disp: 30 tablet, Rfl: 0   nystatin (MYCOSTATIN) 100000 UNIT/ML suspension, Take 5 mLs (500,000 Units total) by mouth 4 (four) times daily., Disp: 60 mL, Rfl: 0   busPIRone (BUSPAR) 10 MG tablet, Take 1 tablet (10 mg total) by mouth 2 (two) times daily., Disp: 180 tablet, Rfl: 1   escitalopram (LEXAPRO) 10 MG tablet, Take 1 tablet (10 mg total) by mouth daily., Disp: 90 tablet, Rfl: 1   fluticasone (FLONASE) 50 MCG/ACT nasal spray, Place into the nose., Disp: , Rfl:    hydrochlorothiazide (HYDRODIURIL) 25 MG tablet, Take 1 tablet (25 mg total) by mouth daily. Occ takes extra for edema, Disp: 110 tablet, Rfl: 3   Potassium Chloride ER 20 MEQ TBCR, Take 1 tablet (20 mEq total) by mouth in the morning and at bedtime., Disp: 180 tablet, Rfl: 1   solifenacin (VESICARE) 5 MG tablet, Take 1 tablet (5 mg total) by mouth daily., Disp: 90 tablet, Rfl: 1  Allergies  Allergen Reactions   Penicillins Rash, Other (See Comments) and Hives    Has patient had a PCN reaction causing immediate rash, facial/tongue/throat swelling, SOB or lightheadedness with hypotension: No Has patient had a PCN reaction causing severe rash involving  mucus membranes or skin necrosis: No Has patient had a PCN reaction that required hospitalization No Has patient had a PCN reaction occurring within the last 10 years: No If all of the above answers are "NO", then may proceed with Cephalosporin use.    ROS neg/noncontributory except as noted HPI/below      Objective:     BP 124/84   Pulse 93   Temp 98.3 F (36.8 C) (Temporal)   Resp 16   Ht 5' (1.524 m)   Wt 108 lb 6 oz (49.2 kg)   SpO2 95%   BMI 21.17 kg/m  Wt Readings from Last 3 Encounters:  07/14/23 108 lb 6 oz (49.2 kg)  04/20/23 115 lb (52.2 kg)  03/02/23 108 lb (49 kg)    Physical Exam   Gen: WDWN NAD, frail female. Looks  older. On O2 HEENT: NCAT, conjunctiva not injected, sclera nonicteric NECK:  supple, no thyromegaly, no nodes, no carotid bruits CARDIAC: RRR, S1S2+, no murmur. DP 2+B LUNGS: CTAB. No wheezes. Distant ABDOMEN:  BS+, soft, NTND, No HSM, no masses EXT:  no edema MSK: in wheelchair.  NEURO: A&O x3.  CN II-XII intact.  PSYCH: normal mood. Good eye contact  PDMP reviewed today, no red flags.      Assessment & Plan:  Primary hypertension Assessment & Plan: Chronic.  Controlled.  Continue HCTZ 25 mg daily and potassium 20 mEq twice daily.  Occasionally takes extra for increased shortness of breath.  Orders: -     hydroCHLOROthiazide; Take 1 tablet (25 mg total) by mouth daily. Occ takes extra for edema  Dispense: 110 tablet; Refill: 3 -     Potassium Chloride ER; Take 1 tablet (20 mEq total) by mouth in the morning and at bedtime.  Dispense: 180 tablet; Refill: 1 -     Comprehensive metabolic panel -     Magnesium -     CBC with Differential/Platelet  Centrilobular emphysema (HCC) Assessment & Plan: Chronic.  Stable. Oxygen dependent.  Continue Symbicort 160/4.5 twice daily and DuoNeb every 6 hours as needed-managed by pulmonary.   Anxiety Assessment & Plan: Chronic.  Mostly controlled.  Continue escitalopram 10 mg daily and buspirone 10 mg twice daily.  Occasional lorazepam 0.5 mg twice daily as needed-aware not to overuse it.  Orders: -     Escitalopram Oxalate; Take 1 tablet (10 mg total) by mouth daily.  Dispense: 90 tablet; Refill: 1 -     busPIRone HCl; Take 1 tablet (10 mg total) by mouth 2 (two) times daily.  Dispense: 180 tablet; Refill: 1  Overactive bladder Assessment & Plan: Chronic.  Well-controlled.  Continue Vesicare 5 mg daily  Orders: -     Solifenacin Succinate; Take 1 tablet (5 mg total) by mouth daily.  Dispense: 90 tablet; Refill: 1  Screening for viral disease -     Hepatitis C antibody    Return in about 3 months (around 10/14/2023) for chronic  follow-up.    I,Rachel Rivera,acting as a scribe for Angelena Sole, MD.,have documented all relevant documentation on the behalf of Angelena Sole, MD,as directed by  Angelena Sole, MD while in the presence of Angelena Sole, MD.  I, Angelena Sole, MD, have reviewed all documentation for this visit. The documentation on 07/15/23 for the exam, diagnosis, procedures, and orders are all accurate and complete.    Angelena Sole, MD

## 2023-07-14 ENCOUNTER — Ambulatory Visit: Payer: Medicare PPO | Admitting: Family Medicine

## 2023-07-14 ENCOUNTER — Encounter: Payer: Self-pay | Admitting: Family Medicine

## 2023-07-14 VITALS — BP 124/84 | HR 93 | Temp 98.3°F | Resp 16 | Ht 60.0 in | Wt 108.4 lb

## 2023-07-14 DIAGNOSIS — Z1159 Encounter for screening for other viral diseases: Secondary | ICD-10-CM

## 2023-07-14 DIAGNOSIS — J432 Centrilobular emphysema: Secondary | ICD-10-CM

## 2023-07-14 DIAGNOSIS — I10 Essential (primary) hypertension: Secondary | ICD-10-CM | POA: Diagnosis not present

## 2023-07-14 DIAGNOSIS — N3281 Overactive bladder: Secondary | ICD-10-CM | POA: Diagnosis not present

## 2023-07-14 DIAGNOSIS — D513 Other dietary vitamin B12 deficiency anemia: Secondary | ICD-10-CM | POA: Insufficient documentation

## 2023-07-14 DIAGNOSIS — F419 Anxiety disorder, unspecified: Secondary | ICD-10-CM | POA: Diagnosis not present

## 2023-07-14 LAB — COMPREHENSIVE METABOLIC PANEL
ALT: 11 U/L (ref 0–35)
AST: 20 U/L (ref 0–37)
Albumin: 4 g/dL (ref 3.5–5.2)
Alkaline Phosphatase: 54 U/L (ref 39–117)
BUN: 10 mg/dL (ref 6–23)
CO2: 40 meq/L — ABNORMAL HIGH (ref 19–32)
Calcium: 9.6 mg/dL (ref 8.4–10.5)
Chloride: 95 meq/L — ABNORMAL LOW (ref 96–112)
Creatinine, Ser: 0.59 mg/dL (ref 0.40–1.20)
GFR: 92.52 mL/min (ref >60.00)
Glucose, Bld: 99 mg/dL (ref 70–99)
Potassium: 3.8 meq/L (ref 3.5–5.1)
Sodium: 142 meq/L (ref 135–145)
Total Bilirubin: 0.2 mg/dL (ref 0.2–1.2)
Total Protein: 6.4 g/dL (ref 6.0–8.3)

## 2023-07-14 LAB — CBC WITH DIFFERENTIAL/PLATELET
Basophils Absolute: 0 10*3/uL (ref 0.0–0.1)
Basophils Relative: 0.7 % (ref 0.0–3.0)
Eosinophils Absolute: 0.1 10*3/uL (ref 0.0–0.7)
Eosinophils Relative: 2.9 % (ref 0.0–5.0)
HCT: 35.9 % — ABNORMAL LOW (ref 36.0–46.0)
Hemoglobin: 11.2 g/dL — ABNORMAL LOW (ref 12.0–15.0)
Lymphocytes Relative: 22.8 % (ref 12.0–46.0)
Lymphs Abs: 0.8 10*3/uL (ref 0.7–4.0)
MCHC: 31.4 g/dL (ref 30.0–36.0)
MCV: 89.7 fL (ref 78.0–100.0)
Monocytes Absolute: 0.4 10*3/uL (ref 0.1–1.0)
Monocytes Relative: 11 % (ref 3.0–12.0)
Neutro Abs: 2.3 10*3/uL (ref 1.4–7.7)
Neutrophils Relative %: 62.6 % (ref 43.0–77.0)
Platelets: 252 10*3/uL (ref 150.0–400.0)
RBC: 4 Mil/uL (ref 3.87–5.11)
RDW: 13.8 % (ref 11.5–15.5)
WBC: 3.6 10*3/uL — ABNORMAL LOW (ref 4.0–10.5)

## 2023-07-14 LAB — MAGNESIUM: Magnesium: 1.7 mg/dL (ref 1.5–2.5)

## 2023-07-14 MED ORDER — HYDROCHLOROTHIAZIDE 25 MG PO TABS
25.0000 mg | ORAL_TABLET | Freq: Every day | ORAL | 3 refills | Status: DC
Start: 2023-07-14 — End: 2023-10-31

## 2023-07-14 MED ORDER — SOLIFENACIN SUCCINATE 5 MG PO TABS
5.0000 mg | ORAL_TABLET | Freq: Every day | ORAL | 1 refills | Status: DC
Start: 2023-07-14 — End: 2023-10-31

## 2023-07-14 MED ORDER — POTASSIUM CHLORIDE ER 20 MEQ PO TBCR
20.0000 meq | EXTENDED_RELEASE_TABLET | Freq: Two times a day (BID) | ORAL | 1 refills | Status: DC
Start: 2023-07-14 — End: 2023-10-31

## 2023-07-14 MED ORDER — ESCITALOPRAM OXALATE 10 MG PO TABS
10.0000 mg | ORAL_TABLET | Freq: Every day | ORAL | 1 refills | Status: DC
Start: 2023-07-14 — End: 2023-10-31

## 2023-07-14 MED ORDER — BUSPIRONE HCL 10 MG PO TABS
10.0000 mg | ORAL_TABLET | Freq: Two times a day (BID) | ORAL | 1 refills | Status: DC
Start: 2023-07-14 — End: 2023-10-31

## 2023-07-14 NOTE — Patient Instructions (Signed)

## 2023-07-15 DIAGNOSIS — J432 Centrilobular emphysema: Secondary | ICD-10-CM | POA: Insufficient documentation

## 2023-07-15 LAB — HEPATITIS C ANTIBODY: Hepatitis C Ab: NONREACTIVE

## 2023-07-15 NOTE — Progress Notes (Signed)
Labs stable but retaining some CO2 from her copd-who is her pulmonologist?  She needs to share w/them

## 2023-07-15 NOTE — Assessment & Plan Note (Signed)
Chronic.  Well-controlled.  Continue Vesicare 5 mg daily

## 2023-07-15 NOTE — Assessment & Plan Note (Signed)
Chronic.  Stable. Oxygen dependent.  Continue Symbicort 160/4.5 twice daily and DuoNeb every 6 hours as needed-managed by pulmonary.

## 2023-07-15 NOTE — Assessment & Plan Note (Signed)
Chronic.  Mostly controlled.  Continue escitalopram 10 mg daily and buspirone 10 mg twice daily.  Occasional lorazepam 0.5 mg twice daily as needed-aware not to overuse it.

## 2023-07-15 NOTE — Assessment & Plan Note (Signed)
Chronic.  Controlled.  Continue HCTZ 25 mg daily and potassium 20 mEq twice daily.  Occasionally takes extra for increased shortness of breath.

## 2023-09-12 ENCOUNTER — Other Ambulatory Visit: Payer: Self-pay | Admitting: Family Medicine

## 2023-10-09 ENCOUNTER — Encounter (HOSPITAL_BASED_OUTPATIENT_CLINIC_OR_DEPARTMENT_OTHER): Payer: Self-pay

## 2023-10-09 ENCOUNTER — Emergency Department (HOSPITAL_BASED_OUTPATIENT_CLINIC_OR_DEPARTMENT_OTHER): Payer: Medicare PPO

## 2023-10-09 ENCOUNTER — Other Ambulatory Visit: Payer: Self-pay

## 2023-10-09 ENCOUNTER — Inpatient Hospital Stay (HOSPITAL_BASED_OUTPATIENT_CLINIC_OR_DEPARTMENT_OTHER)
Admission: EM | Admit: 2023-10-09 | Discharge: 2023-11-04 | DRG: 207 | Disposition: E | Payer: Medicare PPO | Attending: Pulmonary Disease | Admitting: Pulmonary Disease

## 2023-10-09 DIAGNOSIS — R319 Hematuria, unspecified: Secondary | ICD-10-CM | POA: Diagnosis not present

## 2023-10-09 DIAGNOSIS — R339 Retention of urine, unspecified: Secondary | ICD-10-CM | POA: Diagnosis not present

## 2023-10-09 DIAGNOSIS — F32A Depression, unspecified: Secondary | ICD-10-CM | POA: Diagnosis present

## 2023-10-09 DIAGNOSIS — E8770 Fluid overload, unspecified: Secondary | ICD-10-CM | POA: Diagnosis present

## 2023-10-09 DIAGNOSIS — J9622 Acute and chronic respiratory failure with hypercapnia: Secondary | ICD-10-CM | POA: Diagnosis present

## 2023-10-09 DIAGNOSIS — L8990 Pressure ulcer of unspecified site, unspecified stage: Secondary | ICD-10-CM | POA: Insufficient documentation

## 2023-10-09 DIAGNOSIS — R54 Age-related physical debility: Secondary | ICD-10-CM | POA: Diagnosis present

## 2023-10-09 DIAGNOSIS — R918 Other nonspecific abnormal finding of lung field: Secondary | ICD-10-CM | POA: Diagnosis not present

## 2023-10-09 DIAGNOSIS — Z6821 Body mass index (BMI) 21.0-21.9, adult: Secondary | ICD-10-CM

## 2023-10-09 DIAGNOSIS — Z515 Encounter for palliative care: Secondary | ICD-10-CM

## 2023-10-09 DIAGNOSIS — J440 Chronic obstructive pulmonary disease with acute lower respiratory infection: Secondary | ICD-10-CM | POA: Diagnosis present

## 2023-10-09 DIAGNOSIS — J9690 Respiratory failure, unspecified, unspecified whether with hypoxia or hypercapnia: Secondary | ICD-10-CM | POA: Diagnosis not present

## 2023-10-09 DIAGNOSIS — J189 Pneumonia, unspecified organism: Secondary | ICD-10-CM | POA: Diagnosis present

## 2023-10-09 DIAGNOSIS — D638 Anemia in other chronic diseases classified elsewhere: Secondary | ICD-10-CM | POA: Diagnosis present

## 2023-10-09 DIAGNOSIS — J9601 Acute respiratory failure with hypoxia: Secondary | ICD-10-CM | POA: Diagnosis not present

## 2023-10-09 DIAGNOSIS — J9621 Acute and chronic respiratory failure with hypoxia: Secondary | ICD-10-CM | POA: Diagnosis present

## 2023-10-09 DIAGNOSIS — T4275XA Adverse effect of unspecified antiepileptic and sedative-hypnotic drugs, initial encounter: Secondary | ICD-10-CM | POA: Diagnosis not present

## 2023-10-09 DIAGNOSIS — R64 Cachexia: Secondary | ICD-10-CM | POA: Diagnosis present

## 2023-10-09 DIAGNOSIS — Z87891 Personal history of nicotine dependence: Secondary | ICD-10-CM

## 2023-10-09 DIAGNOSIS — Z7189 Other specified counseling: Secondary | ICD-10-CM | POA: Diagnosis not present

## 2023-10-09 DIAGNOSIS — E43 Unspecified severe protein-calorie malnutrition: Secondary | ICD-10-CM | POA: Diagnosis present

## 2023-10-09 DIAGNOSIS — L89812 Pressure ulcer of head, stage 2: Secondary | ICD-10-CM | POA: Diagnosis not present

## 2023-10-09 DIAGNOSIS — Z7982 Long term (current) use of aspirin: Secondary | ICD-10-CM

## 2023-10-09 DIAGNOSIS — Z66 Do not resuscitate: Secondary | ICD-10-CM | POA: Diagnosis present

## 2023-10-09 DIAGNOSIS — Z880 Allergy status to penicillin: Secondary | ICD-10-CM

## 2023-10-09 DIAGNOSIS — E871 Hypo-osmolality and hyponatremia: Secondary | ICD-10-CM | POA: Diagnosis present

## 2023-10-09 DIAGNOSIS — I10 Essential (primary) hypertension: Secondary | ICD-10-CM | POA: Diagnosis not present

## 2023-10-09 DIAGNOSIS — I952 Hypotension due to drugs: Secondary | ICD-10-CM | POA: Diagnosis not present

## 2023-10-09 DIAGNOSIS — E874 Mixed disorder of acid-base balance: Secondary | ICD-10-CM | POA: Diagnosis present

## 2023-10-09 DIAGNOSIS — J9620 Acute and chronic respiratory failure, unspecified whether with hypoxia or hypercapnia: Secondary | ICD-10-CM | POA: Diagnosis present

## 2023-10-09 DIAGNOSIS — J9600 Acute respiratory failure, unspecified whether with hypoxia or hypercapnia: Secondary | ICD-10-CM | POA: Diagnosis not present

## 2023-10-09 DIAGNOSIS — G9341 Metabolic encephalopathy: Secondary | ICD-10-CM | POA: Diagnosis present

## 2023-10-09 DIAGNOSIS — R579 Shock, unspecified: Secondary | ICD-10-CM | POA: Diagnosis present

## 2023-10-09 DIAGNOSIS — J441 Chronic obstructive pulmonary disease with (acute) exacerbation: Secondary | ICD-10-CM | POA: Diagnosis present

## 2023-10-09 DIAGNOSIS — J439 Emphysema, unspecified: Secondary | ICD-10-CM | POA: Diagnosis present

## 2023-10-09 DIAGNOSIS — R000 Tachycardia, unspecified: Secondary | ICD-10-CM | POA: Diagnosis not present

## 2023-10-09 DIAGNOSIS — R578 Other shock: Secondary | ICD-10-CM | POA: Diagnosis not present

## 2023-10-09 DIAGNOSIS — J13 Pneumonia due to Streptococcus pneumoniae: Principal | ICD-10-CM | POA: Diagnosis present

## 2023-10-09 DIAGNOSIS — R0602 Shortness of breath: Secondary | ICD-10-CM | POA: Diagnosis not present

## 2023-10-09 DIAGNOSIS — Z23 Encounter for immunization: Secondary | ICD-10-CM | POA: Diagnosis present

## 2023-10-09 DIAGNOSIS — B9789 Other viral agents as the cause of diseases classified elsewhere: Secondary | ICD-10-CM | POA: Diagnosis present

## 2023-10-09 DIAGNOSIS — R739 Hyperglycemia, unspecified: Secondary | ICD-10-CM | POA: Diagnosis not present

## 2023-10-09 DIAGNOSIS — B348 Other viral infections of unspecified site: Secondary | ICD-10-CM | POA: Diagnosis not present

## 2023-10-09 DIAGNOSIS — Z4682 Encounter for fitting and adjustment of non-vascular catheter: Secondary | ICD-10-CM | POA: Diagnosis not present

## 2023-10-09 DIAGNOSIS — J90 Pleural effusion, not elsewhere classified: Secondary | ICD-10-CM | POA: Diagnosis not present

## 2023-10-09 DIAGNOSIS — J449 Chronic obstructive pulmonary disease, unspecified: Secondary | ICD-10-CM | POA: Diagnosis not present

## 2023-10-09 DIAGNOSIS — Z9981 Dependence on supplemental oxygen: Secondary | ICD-10-CM

## 2023-10-09 DIAGNOSIS — Z9911 Dependence on respirator [ventilator] status: Secondary | ICD-10-CM

## 2023-10-09 DIAGNOSIS — D6489 Other specified anemias: Secondary | ICD-10-CM | POA: Diagnosis present

## 2023-10-09 DIAGNOSIS — D649 Anemia, unspecified: Secondary | ICD-10-CM | POA: Diagnosis not present

## 2023-10-09 DIAGNOSIS — Z8249 Family history of ischemic heart disease and other diseases of the circulatory system: Secondary | ICD-10-CM

## 2023-10-09 DIAGNOSIS — Z452 Encounter for adjustment and management of vascular access device: Secondary | ICD-10-CM | POA: Diagnosis not present

## 2023-10-09 DIAGNOSIS — J9811 Atelectasis: Secondary | ICD-10-CM | POA: Diagnosis not present

## 2023-10-09 DIAGNOSIS — J9602 Acute respiratory failure with hypercapnia: Secondary | ICD-10-CM | POA: Diagnosis not present

## 2023-10-09 DIAGNOSIS — F40240 Claustrophobia: Secondary | ICD-10-CM | POA: Diagnosis present

## 2023-10-09 DIAGNOSIS — Z7951 Long term (current) use of inhaled steroids: Secondary | ICD-10-CM

## 2023-10-09 DIAGNOSIS — Z79899 Other long term (current) drug therapy: Secondary | ICD-10-CM

## 2023-10-09 DIAGNOSIS — R059 Cough, unspecified: Secondary | ICD-10-CM | POA: Diagnosis not present

## 2023-10-09 LAB — BASIC METABOLIC PANEL
Anion gap: 12 (ref 5–15)
BUN: 23 mg/dL (ref 8–23)
CO2: 40 mmol/L — ABNORMAL HIGH (ref 22–32)
Calcium: 10.9 mg/dL — ABNORMAL HIGH (ref 8.9–10.3)
Chloride: 90 mmol/L — ABNORMAL LOW (ref 98–111)
Creatinine, Ser: 0.67 mg/dL (ref 0.44–1.00)
GFR, Estimated: >60 mL/min (ref >60)
Glucose, Bld: 129 mg/dL — ABNORMAL HIGH (ref 70–99)
Potassium: 4 mmol/L (ref 3.5–5.1)
Sodium: 142 mmol/L (ref 135–145)

## 2023-10-09 LAB — BRAIN NATRIURETIC PEPTIDE: B Natriuretic Peptide: 100.3 pg/mL — ABNORMAL HIGH (ref 0.0–100.0)

## 2023-10-09 LAB — CBC WITH DIFFERENTIAL/PLATELET
Abs Immature Granulocytes: 0.04 10*3/uL (ref 0.00–0.07)
Basophils Absolute: 0 10*3/uL (ref 0.0–0.1)
Basophils Relative: 0 %
Eosinophils Absolute: 0 10*3/uL (ref 0.0–0.5)
Eosinophils Relative: 0 %
HCT: 38.9 % (ref 36.0–46.0)
Hemoglobin: 11.7 g/dL — ABNORMAL LOW (ref 12.0–15.0)
Immature Granulocytes: 0 %
Lymphocytes Relative: 4 %
Lymphs Abs: 0.6 10*3/uL — ABNORMAL LOW (ref 0.7–4.0)
MCH: 28.5 pg (ref 26.0–34.0)
MCHC: 30.1 g/dL (ref 30.0–36.0)
MCV: 94.9 fL (ref 80.0–100.0)
Monocytes Absolute: 1.6 10*3/uL — ABNORMAL HIGH (ref 0.1–1.0)
Monocytes Relative: 13 %
Neutro Abs: 10.9 10*3/uL — ABNORMAL HIGH (ref 1.7–7.7)
Neutrophils Relative %: 83 %
Platelets: 422 10*3/uL — ABNORMAL HIGH (ref 150–400)
RBC: 4.1 MIL/uL (ref 3.87–5.11)
RDW: 13.2 % (ref 11.5–15.5)
WBC: 13.2 10*3/uL — ABNORMAL HIGH (ref 4.0–10.5)
nRBC: 0.2 % (ref 0.0–0.2)

## 2023-10-09 LAB — RESP PANEL BY RT-PCR (RSV, FLU A&B, COVID)  RVPGX2
Influenza A by PCR: NEGATIVE
Influenza B by PCR: NEGATIVE
Resp Syncytial Virus by PCR: NEGATIVE
SARS Coronavirus 2 by RT PCR: NEGATIVE

## 2023-10-09 LAB — LACTIC ACID, PLASMA: Lactic Acid, Venous: 1.6 mmol/L (ref 0.5–1.9)

## 2023-10-09 MED ORDER — IPRATROPIUM-ALBUTEROL 0.5-2.5 (3) MG/3ML IN SOLN
3.0000 mL | Freq: Once | RESPIRATORY_TRACT | Status: AC
  Administered 2023-10-09: 3 mL via RESPIRATORY_TRACT
  Filled 2023-10-09: qty 3

## 2023-10-09 MED ORDER — ALBUTEROL (5 MG/ML) CONTINUOUS INHALATION SOLN
10.0000 mg/h | INHALATION_SOLUTION | RESPIRATORY_TRACT | Status: AC
  Administered 2023-10-09: 10 mg/h via RESPIRATORY_TRACT
  Filled 2023-10-09: qty 20

## 2023-10-09 MED ORDER — IPRATROPIUM BROMIDE 0.02 % IN SOLN
0.5000 mg | Freq: Once | RESPIRATORY_TRACT | Status: AC
  Administered 2023-10-09: 0.5 mg via RESPIRATORY_TRACT
  Filled 2023-10-09: qty 2.5

## 2023-10-09 MED ORDER — SODIUM CHLORIDE 0.9 % IV SOLN
1.0000 g | Freq: Once | INTRAVENOUS | Status: AC
  Administered 2023-10-09: 1 g via INTRAVENOUS
  Filled 2023-10-09: qty 10

## 2023-10-09 MED ORDER — METHYLPREDNISOLONE SODIUM SUCC 125 MG IJ SOLR
125.0000 mg | Freq: Once | INTRAMUSCULAR | Status: AC
  Administered 2023-10-09: 125 mg via INTRAVENOUS
  Filled 2023-10-09: qty 2

## 2023-10-09 MED ORDER — SODIUM CHLORIDE 0.9 % IV SOLN
500.0000 mg | Freq: Once | INTRAVENOUS | Status: AC
  Administered 2023-10-09: 500 mg via INTRAVENOUS
  Filled 2023-10-09: qty 5

## 2023-10-09 MED ORDER — LORAZEPAM 2 MG/ML IJ SOLN
0.5000 mg | Freq: Once | INTRAMUSCULAR | Status: AC
  Administered 2023-10-09: 0.5 mg via INTRAVENOUS
  Filled 2023-10-09: qty 1

## 2023-10-09 MED ORDER — MAGNESIUM SULFATE 2 GM/50ML IV SOLN
2.0000 g | Freq: Once | INTRAVENOUS | Status: AC
Start: 2023-10-09 — End: 2023-10-10
  Administered 2023-10-09: 2 g via INTRAVENOUS
  Filled 2023-10-09: qty 50

## 2023-10-09 MED ORDER — SODIUM CHLORIDE 0.9 % IV BOLUS
1000.0000 mL | Freq: Once | INTRAVENOUS | Status: AC
  Administered 2023-10-09: 1000 mL via INTRAVENOUS

## 2023-10-09 NOTE — ED Notes (Signed)
 Attempted blood draws x 2, got good blood return, just cannot get enough volume to fill the syringe or vial.  RN aware and consulting with EDP about next steps.

## 2023-10-09 NOTE — ED Triage Notes (Addendum)
 C/o shortness of breath x 3 days, worse today. Congested cough. Wears 2 L O2 at baseline, has increased it to 3L. Slight chest pain. RT in triage to assess  Son states increased confusion as well

## 2023-10-09 NOTE — Progress Notes (Addendum)
 Plan of Care Note for accepted transfer   Patient: Carla Rivera MRN: [id number]   DOA: 10/09/2023  Facility requesting transfer: [facility]. Requesting Provider: Marsa Forth.  PA. Reason for transfer: COPD exacerbation pneumonia. Facility course: 68 year old female with COPD on 2 L home oxygen , hypertension presents with shortness of breath and wheezing COVID and flu test were negative.  Chest x-ray shows features concerning for pneumonia.  Patient is tachycardic otherwise as per ER physician not in distress.  Started on medications for pneumonia and COPD exacerbation admitted for further observation.  Plan of care: The patient is accepted for admission to Telemetry unit, at [facility].  Addendum -    ER physician notified me that patient's respiratory status has worsened.  They will be consulting pulmonary critical care.    Author: Redia LOISE Cleaver, MD 10/09/2023  Check [email or web address] for on-call coverage.  Nursing staff, Please call [facility] Admits & Consults System-Wide number on Amion as soon as patient's arrival, so appropriate admitting provider can evaluate the pt.

## 2023-10-09 NOTE — ED Provider Notes (Signed)
  EMERGENCY DEPARTMENT AT [facility] Provider Note   CSN: [id number] Arrival date & time: 10/09/23  1641     History  Chief Complaint  Patient presents with   Shortness of Breath    Carla Rivera is a 68 y.o. female.  With history of emphysema, hypertension, depression presenting to the ED for evaluation of shortness of breath.  Symptoms began 3 days ago and have progressively gotten worse.  She reports a cough productive of light brown sputum as well.  She wears 2 L nasal cannula at baseline, her son had to increase this to 3 L 2 days ago due to increased work of breathing.  She states she feels rattling in her chest.  She denies any chest pain.  She reports subjective fevers, no measured fevers.  She denies any abdominal pain.  No nausea or vomiting.  No recent cancer treatments, surgeries or long distance travel.  She denies any calf pain.   Shortness of Breath Associated symptoms: cough        Home Medications Prior to Admission medications   Medication Sig Start Date End Date Taking? Authorizing Provider  aspirin  EC 81 MG tablet Take 81 mg by mouth daily. Swallow whole.    [provider]  budesonide -formoterol  (SYMBICORT ) 160-4.5 MCG/ACT inhaler Inhale 2 puffs into the lungs 2 (two) times daily. 09/06/17   Trudy Dorn BRAVO, MD  busPIRone  (BUSPAR ) 10 MG tablet Take 1 tablet (10 mg total) by mouth 2 (two) times daily. 07/14/23   Wendolyn Jenkins Jansky, MD  escitalopram  (LEXAPRO ) 10 MG tablet Take 1 tablet (10 mg total) by mouth daily. 07/14/23   Wendolyn Jenkins Jansky, MD  fluticasone (FLONASE) 50 MCG/ACT nasal spray Place into the nose. 10/21/21 10/21/22  [provider]  hydrochlorothiazide  (HYDRODIURIL ) 25 MG tablet Take 1 tablet (25 mg total) by mouth daily. Occ takes extra for edema 07/14/23   Wendolyn Jenkins Jansky, MD  ipratropium-albuterol  (DUONEB) 0.5-2.5 (3) MG/3ML SOLN Take 3 mLs by nebulization 4 (four) times daily. 04/24/23   [provider]  LORazepam  (ATIVAN ) 0.5 MG tablet Take 1 tablet (0.5 mg total) by mouth 2 (two) times daily as needed. 04/20/23   Wendolyn Jenkins Jansky, MD  nystatin  (MYCOSTATIN ) 100000 UNIT/ML suspension Take 5 mLs (500,000 Units total) by mouth 4 (four) times daily. 04/22/20   Rancour, Garnette, MD  Potassium Chloride  ER 20 MEQ TBCR Take 1 tablet (20 mEq total) by mouth in the morning and at bedtime. 07/14/23   Wendolyn Jenkins Jansky, MD  solifenacin  (VESICARE ) 5 MG tablet Take 1 tablet (5 mg total) by mouth daily. 07/14/23   Wendolyn Jenkins Jansky, MD      Allergies    Penicillins    Review of Systems   Review of Systems  Respiratory:  Positive for cough and shortness of breath.   All other systems reviewed and are negative.   Physical Exam Updated Vital Signs BP (!) 142/76   Pulse (!) 122   Temp 97.8 F (36.6 C)   Resp (!) 27   Ht 5' (1.524 m)   Wt 49 kg   SpO2 98%   BMI 21.09 kg/m  Physical Exam Vitals and nursing note reviewed.  Constitutional:      General: She is not in acute distress.    Appearance: Normal appearance. She is normal weight. She is not ill-appearing.     Comments: Cachectic, chronically ill-appearing  HENT:     Head: Normocephalic and atraumatic.  Cardiovascular:  Rate and Rhythm: Tachycardia present.  Pulmonary:     Effort: Pulmonary effort is normal. No respiratory distress.     Breath sounds: Decreased breath sounds and rhonchi present.  Abdominal:     General: Abdomen is flat.  Musculoskeletal:        General: Normal range of motion.     Cervical back: Neck supple.     Right lower leg: No edema.     Left lower leg: No edema.  Skin:    General: Skin is warm and dry.  Neurological:     Mental Status: She is alert and oriented to person, place, and time.  Psychiatric:        Mood and Affect: Mood normal.        Behavior: Behavior normal.     ED Results / Procedures / Treatments   Labs (all labs ordered are listed, but only abnormal results are  displayed) Labs Reviewed  BASIC METABOLIC PANEL - Abnormal; Notable for the following components:      Result Value   Chloride 90 (*)    CO2 40 (*)    Glucose, Bld 129 (*)    Calcium 10.9 (*)    All other components within normal limits  CBC WITH DIFFERENTIAL/PLATELET - Abnormal; Notable for the following components:   WBC 13.2 (*)    Hemoglobin 11.7 (*)    Platelets 422 (*)    Neutro Abs 10.9 (*)    Lymphs Abs 0.6 (*)    Monocytes Absolute 1.6 (*)    All other components within normal limits  BRAIN NATRIURETIC PEPTIDE - Abnormal; Notable for the following components:   B Natriuretic Peptide 100.3 (*)    All other components within normal limits  RESP PANEL BY RT-PCR (RSV, FLU A&B, COVID)  RVPGX2  CULTURE, BLOOD (ROUTINE X 2)  CULTURE, BLOOD (ROUTINE X 2)  LACTIC ACID, PLASMA  I-STAT VENOUS BLOOD GAS, ED    EKG None  Radiology DG Chest 2 View Result Date: 10/09/2023 CLINICAL DATA:  Cough and hypoxia.  Shortness of breath for 3 days EXAM: CHEST - 2 VIEW COMPARISON:  X-ray 04/22/2020, 04/09/2023. FINDINGS: Hyperinflation with chronic lung changes. There is new areas of consolidative opacity in the right lung base with a question tiny right effusion, more middle lobe than lower lobe. More mild changes at the left lung base. Possible infiltrate or pneumonia. Recommend follow-up. No pneumothorax or edema. Normal cardiopericardial silhouette. Overlapping cardiac leads. IMPRESSION: Consolidative opacity seen in the right lung base with a small right effusion. More in the middle lobe. Subtle opacity left lung base as well. Multifocal infiltrate or pneumonia is possible. Recommend follow up to confirm clearance. Electronically Signed   By: Ranell Bring M.D.   On: 10/09/2023 17:56    Procedures Procedures    Medications Ordered in ED Medications  cefTRIAXone  (ROCEPHIN ) 1 g in sodium chloride  0.9 % 100 mL IVPB (has no administration in time range)  azithromycin  (ZITHROMAX ) 500 mg in  sodium chloride  0.9 % 250 mL IVPB (has no administration in time range)  ipratropium-albuterol  (DUONEB) 0.5-2.5 (3) MG/3ML nebulizer solution 3 mL (3 mLs Nebulization Given 10/09/23 1719)  methylPREDNISolone  sodium succinate (SOLU-MEDROL ) 125 mg/2 mL injection 125 mg (125 mg Intravenous Given 10/09/23 1741)  sodium chloride  0.9 % bolus 1,000 mL (1,000 mLs Intravenous New Bag/Given 10/09/23 1834)    ED Course/ Medical Decision Making/ A&P  PORT Score: 36            Medical Decision Making Amount and/or Complexity of Data Reviewed Labs: ordered. Radiology: ordered.  Risk Prescription drug management. Decision regarding hospitalization.  This patient presents to the ED for concern of shortness of breath, this involves an extensive number of treatment options, and is a complaint that carries with it a high risk of complications and morbidity. The emergent differential diagnosis for shortness of breath includes, but is not limited to, Pulmonary edema, bronchoconstriction, Pneumonia, Pulmonary embolism, Pneumotherax/ Hemothorax, Dysrythmia, ACS.    My initial workup includes labs, imaging, EKG, DuoNeb, Solu-Medrol   Additional history obtained from: Nursing notes from this visit. Family son at bedside provides a portion of the history Echo on 03/18/2021 with an EF of 60 to 65%  I ordered, reviewed and interpreted labs which include: CBC, BMP, BNP, respiratory panel.  Leukocytosis of 13.2.  Borderline anemia with a hemoglobin of 11.7.  BNP minimally elevated to 100.3.  BMP with a chloride of 90, bicarb of 20.  Respiratory panel negative.  Lactate normal.  I ordered imaging studies including chest x-ray I independently visualized and interpreted imaging which showed right-sided multifocal pneumonia I agree with the radiologist interpretation  Cardiac Monitoring:  The patient was maintained on a cardiac monitor.  I personally viewed and interpreted the cardiac monitored which  showed an underlying rhythm of: Sinus tachycardia  Consultations Obtained:  I requested consultation with the hospitalist,  and discussed lab and imaging findings as well as pertinent plan - they recommend: Consultation pending at shift change  Afebrile, tachycardic and tachypneic but otherwise hemodynamically stable.  68 year old female presents to the ED for evaluation of shortness of breath and cough.  She is short of breath at baseline secondary to emphysema.  Shortness of breath began to worsen 3 days ago and significantly worsened yesterday.  She reports some subjective fevers.  Cough is now productive of light brown sputum.  She denies any chest pain.  Lab workup concerning for a leukocytosis of 13.2.  And also has bicarb of 40, however has severe COPD and is on 2 L nasal cannula at baseline.  Her son recently increased this to 3 L nasal cannula due to increased work of breathing.  Chest x-ray concerning for right-sided multifocal pneumonia with pleural effusion.  This is likely the cause of her symptoms.  Patient initially treated for potential COPD exacerbation with Solu-Medrol  and a DuoNeb.  She reported minimal improvement in her symptoms.  She was initiated on IV antibiotics for community-acquired pneumonia.  Port score of 98.  Patient would benefit from admission to hospitalist.  Discussed this plan with the patient who is in agreement.   Patient's case discussed with Dr. Franklyn who agrees with plan to admit.   Note: Portions of this report may have been transcribed using voice recognition software. Every effort was made to ensure accuracy; however, inadvertent computerized transcription errors may still be present.         Final Clinical Impression(s) / ED Diagnoses Final diagnoses:  Community acquired pneumonia of right lower lobe of lung    Rx / DC Orders ED Discharge Orders     None         Edwardo Marsa HERO, DEVONNA 10/09/23 1908    Franklyn Sid SAILOR, MD 10/10/23  [phone]

## 2023-10-09 NOTE — ED Notes (Signed)
 Placed patient on a [facility] HFNC due to patients WOB. Patient breathing at a rate of 32-38. Patient placed on 8L [place] @ 45%. FIO2. Will monitor and make changes as necessary. Patient tolerating.

## 2023-10-09 NOTE — ED Notes (Signed)
 [facility] called for Transport, No ETA Patient going ED to [facility] @ 23:13

## 2023-10-09 NOTE — ED Provider Notes (Addendum)
 Patient was seen by previous provider and admitted to the medicine service [facility]) for COPD exacerbation and pneumonia with SIRS criteria.  While waiting for transfer and bed placement she has had increasing respiratory requirement, went to high flow nasal cannula however still persistently with increased work of breathing, tachycardia and tachypnea.  She is not moving much air on exam.  Sitting upright in bed.  Will trial BiPAP.  Will get ABG.  I discussed with admitting team, Dr. Franky who is requesting ICU admission.  Patient initially refused BiPAP however would like to be full code. Long discussion with myself as well as attending, Dr. Franklyn.  She is now agreeable for trial of BiPAP.  I discussed with Dr. Layman with PCCM.  Would like patient observed on BiPAP to see how she tolerates it.  May need intubation for transfer.  Patient does not want intubation for transfer at this time would like to trial BiPaP longer and then decide to see if she would improve.   Reassessment. She feels improved on BiPap. Will transfer ED to ED pending reassessment and ICU admission.  CONSULT with Dr. Carita EDP at [facility]. Agreeable for transfer  Discussion with patient in room.  Patient states she would want everything done.  She would like to continue on the BiPAP, she is agreeable for intubation if her respiratory status would continue to worsen.  Unfortunately respiratory has been unable to obtain ABG due to difficult stick at this time.  Patient will need reconsult to critical care once at [facility].  Attending Dr. Franklyn at bedside stable for transfer on BiPap. Does not need intubation at this time.  ADDEND: Unfortunately while awaiting for [facility] for transfer she developed worsening respiratory status.  Progressively more tachycardic, tachypneic breathing > 40 tim/min and tachycardic to 120's.  She is pulling at her mask.  Increasingly more somnolent.  Attending, Dr. Franklyn at bedside as well as  myself and respiratory.  Patient's son is unsure of intubation at this time. Dr. Franklyn spoke with family about her worsening respiratory status.  Son now agreeable to intubation.  Moved to the resuscitation room.  See attending note for intubation.  Discussed with Dr. Layman with PCCM.  Patient is being transferred ED to ED to [facility].  Please consult critical care when she arrives to the emergency department     Physical Exam  BP 103/68   Pulse (!) 105   Temp 97.8 F (36.6 C)   Resp 14   Ht 5' (1.524 m)   Wt 49 kg   SpO2 100%   BMI 21.09 kg/m   Physical Exam Vitals and nursing note reviewed.  Constitutional:      General: She is in acute distress.     Appearance: She is ill-appearing.     Comments: Cachectic, thin, chronically ill-appearing  HENT:     Head: Atraumatic.  Eyes:     Pupils: Pupils are equal, round, and reactive to light.  Cardiovascular:     Rate and Rhythm: Tachycardia present.  Pulmonary:     Effort: Respiratory distress present.     Breath sounds: Decreased breath sounds present.     Comments: Decreased breath sounds throughout, speaks in 1 to 2 hours without difficulty Abdominal:     General: There is no distension.  Musculoskeletal:        General: Normal range of motion.     Cervical back: Normal range of motion.  Skin:    General: Skin is warm and dry.  Neurological:     Motor: Weakness present.     Comments: Global weakness    Procedures  .Critical Care  Performed by: Edie Rosebud LABOR, PA-C Authorized by: Edie Rosebud LABOR, PA-C   Critical care provider statement:    Critical care time (minutes):  35   Critical care was necessary to treat or prevent imminent or life-threatening deterioration of the following conditions:  Respiratory failure   Critical care was time spent personally by me on the following activities:  Development of treatment plan with patient or surrogate, discussions with consultants, evaluation of patient's  response to treatment, examination of patient, ordering and review of laboratory studies, ordering and review of radiographic studies, ordering and performing treatments and interventions, pulse oximetry, re-evaluation of patient's condition and review of old charts   ED Course / MDM   Clinical Course as of 10/10/23 0105  Tue Oct 10, 2023  0008 Patient presented with and was admitted with COPD exacerbation and pneumonia. Despite treatments including IV antibiotics, recurrent DuoNebs, continuous albuterol  treatment, magnesium , Solu-Medrol , patient continued to worsen in her respiratory status throughout the day. Her air movement is very poor and she has mild wheezing, extremely poor air movement, and diffuse rhonchi bilaterally. Her  Patient did not want to do BiPAP in spite of worsening respiratory status due to claustrophobia and not liking the mask, but eventually was convinced by myself, RT, and PA.  Patient required Ativan  in order to use the BiPAP.  After initiating the BiPAP, patient's respiratory status continued to worsen and she became increasingly tachypneic to greater than 40 breaths/min.  Discussed extensively with patient's son at bedside.  Patient's son is extremely upset, claiming that the BiPAP made her respiratory status worse.  I reassured him that it is not the BiPAP that made it worse but rather her worsening clinical status despite all of our treatments over the course of the day. Patient also became sleepy with ativan  however her respiratory rate did not decline, instead it rose. I think likely her acidosis is worsening and her respiratory rate and mental status are worsening. She is an extremely difficult stick and we have not been able to obtain a blood gas. PA Charlott had discussed with the patient earlier in the day who stated that she would like everything done when asked about intubation and CODE STATUS.  I relayed this to the son who states he is not sure.  I discussed with  the son repeatedly as well as his wife over the phone and relayed that likely if we do not intubate her she will die.  I also relayed that even if we do intubate her it is very possible that she will die. Patient's son agrees to intubation. I feel also that the patient's preference was intubation. Patient will be moved to resus bed for intubation.  [HN]  0053 Patient intubated without incident.  Confirmed by auscultation as well as by chest x-ray.  Postintubation sedation with propofol  and fentanyl .  Patient is requiring repeated boluses of propofol  in order to stay sedated.  Very important to ensure adequate sedation and compliance with the ventilator.  Patient then becomes expectedly mildly hypotensive.  Will start 1 L fluid bolus.  Will start Levophed  if her hypotension does not improve.  [facility] is here at bedside awaiting to take the patient. [HN]    Clinical Course User Index [HN] Franklyn Sid SAILOR, MD   Medical Decision Making Amount and/or Complexity of Data Reviewed Independent Historian:  Details: Son in room External Data Reviewed: labs, radiology, ECG and notes. Labs: ordered. Decision-making details documented in ED Course. Radiology: ordered and independent interpretation performed. Decision-making details documented in ED Course. ECG/medicine tests: ordered and independent interpretation performed. Decision-making details documented in ED Course.  Risk OTC drugs. Prescription drug management. Parenteral controlled substances. Decision regarding hospitalization. Diagnosis or treatment significantly limited by social determinants of health.       Devery Odwyer A, PA-C 10/09/23 2321    Damier Disano A, PA-C 10/10/23 0055    Franklyn Sid SAILOR, MD 10/10/23 0104    Juell Radney A, PA-C 10/10/23 0106    Franklyn Sid SAILOR, MD 10/11/23 [phone]

## 2023-10-09 NOTE — ED Notes (Signed)
 SpO2 in upper 70s/ low 80s at arrival, placed on 6L with improvement to 95%

## 2023-10-09 NOTE — ED Notes (Signed)
 Multiple failed attempts to get 2nd blood cultures and VBG.  MD aware and gave permission to start abx without second set of cultures.

## 2023-10-09 NOTE — ED Notes (Signed)
 [facility] contacted for update of ETA.. [facility] states for truck to be en route Called @ 23:41

## 2023-10-09 NOTE — ED Notes (Signed)
 Patient was started on bipap.  Ativan  0.5mg  given IV for agitation/fighting the bipap. Patient's condition continued to worsen with increased work of breathing as well as increased respirations.  MD in room discussing risks and benefits of intubating the patient with her son.

## 2023-10-10 ENCOUNTER — Emergency Department (HOSPITAL_BASED_OUTPATIENT_CLINIC_OR_DEPARTMENT_OTHER): Payer: Medicare PPO

## 2023-10-10 ENCOUNTER — Inpatient Hospital Stay (HOSPITAL_COMMUNITY): Payer: Medicare PPO

## 2023-10-10 ENCOUNTER — Observation Stay (HOSPITAL_COMMUNITY): Payer: Medicare PPO

## 2023-10-10 DIAGNOSIS — I10 Essential (primary) hypertension: Secondary | ICD-10-CM | POA: Diagnosis present

## 2023-10-10 DIAGNOSIS — E874 Mixed disorder of acid-base balance: Secondary | ICD-10-CM | POA: Diagnosis present

## 2023-10-10 DIAGNOSIS — I952 Hypotension due to drugs: Secondary | ICD-10-CM | POA: Diagnosis not present

## 2023-10-10 DIAGNOSIS — R339 Retention of urine, unspecified: Secondary | ICD-10-CM | POA: Diagnosis not present

## 2023-10-10 DIAGNOSIS — R578 Other shock: Secondary | ICD-10-CM | POA: Diagnosis not present

## 2023-10-10 DIAGNOSIS — F32A Depression, unspecified: Secondary | ICD-10-CM | POA: Diagnosis present

## 2023-10-10 DIAGNOSIS — J9601 Acute respiratory failure with hypoxia: Secondary | ICD-10-CM | POA: Diagnosis not present

## 2023-10-10 DIAGNOSIS — J9622 Acute and chronic respiratory failure with hypercapnia: Secondary | ICD-10-CM | POA: Diagnosis present

## 2023-10-10 DIAGNOSIS — R918 Other nonspecific abnormal finding of lung field: Secondary | ICD-10-CM | POA: Diagnosis not present

## 2023-10-10 DIAGNOSIS — D6489 Other specified anemias: Secondary | ICD-10-CM | POA: Diagnosis present

## 2023-10-10 DIAGNOSIS — G9341 Metabolic encephalopathy: Secondary | ICD-10-CM | POA: Diagnosis present

## 2023-10-10 DIAGNOSIS — J9621 Acute and chronic respiratory failure with hypoxia: Secondary | ICD-10-CM | POA: Diagnosis present

## 2023-10-10 DIAGNOSIS — R54 Age-related physical debility: Secondary | ICD-10-CM | POA: Diagnosis present

## 2023-10-10 DIAGNOSIS — J440 Chronic obstructive pulmonary disease with acute lower respiratory infection: Secondary | ICD-10-CM | POA: Diagnosis present

## 2023-10-10 DIAGNOSIS — R579 Shock, unspecified: Secondary | ICD-10-CM | POA: Diagnosis present

## 2023-10-10 DIAGNOSIS — J441 Chronic obstructive pulmonary disease with (acute) exacerbation: Secondary | ICD-10-CM | POA: Diagnosis present

## 2023-10-10 DIAGNOSIS — D638 Anemia in other chronic diseases classified elsewhere: Secondary | ICD-10-CM | POA: Diagnosis present

## 2023-10-10 DIAGNOSIS — L89812 Pressure ulcer of head, stage 2: Secondary | ICD-10-CM | POA: Diagnosis not present

## 2023-10-10 DIAGNOSIS — J189 Pneumonia, unspecified organism: Secondary | ICD-10-CM | POA: Diagnosis present

## 2023-10-10 DIAGNOSIS — R64 Cachexia: Secondary | ICD-10-CM | POA: Diagnosis present

## 2023-10-10 DIAGNOSIS — E43 Unspecified severe protein-calorie malnutrition: Secondary | ICD-10-CM | POA: Diagnosis present

## 2023-10-10 DIAGNOSIS — E871 Hypo-osmolality and hyponatremia: Secondary | ICD-10-CM | POA: Diagnosis present

## 2023-10-10 DIAGNOSIS — J13 Pneumonia due to Streptococcus pneumoniae: Secondary | ICD-10-CM | POA: Diagnosis present

## 2023-10-10 DIAGNOSIS — B348 Other viral infections of unspecified site: Secondary | ICD-10-CM | POA: Diagnosis not present

## 2023-10-10 DIAGNOSIS — Z4682 Encounter for fitting and adjustment of non-vascular catheter: Secondary | ICD-10-CM | POA: Diagnosis not present

## 2023-10-10 DIAGNOSIS — Z515 Encounter for palliative care: Secondary | ICD-10-CM | POA: Diagnosis not present

## 2023-10-10 DIAGNOSIS — J439 Emphysema, unspecified: Secondary | ICD-10-CM | POA: Diagnosis present

## 2023-10-10 DIAGNOSIS — J9620 Acute and chronic respiratory failure, unspecified whether with hypoxia or hypercapnia: Secondary | ICD-10-CM | POA: Diagnosis present

## 2023-10-10 DIAGNOSIS — Z66 Do not resuscitate: Secondary | ICD-10-CM | POA: Diagnosis present

## 2023-10-10 DIAGNOSIS — J9690 Respiratory failure, unspecified, unspecified whether with hypoxia or hypercapnia: Secondary | ICD-10-CM | POA: Diagnosis not present

## 2023-10-10 DIAGNOSIS — E8770 Fluid overload, unspecified: Secondary | ICD-10-CM | POA: Diagnosis present

## 2023-10-10 DIAGNOSIS — Z7189 Other specified counseling: Secondary | ICD-10-CM | POA: Diagnosis not present

## 2023-10-10 DIAGNOSIS — Z23 Encounter for immunization: Secondary | ICD-10-CM | POA: Diagnosis present

## 2023-10-10 DIAGNOSIS — Z9911 Dependence on respirator [ventilator] status: Secondary | ICD-10-CM | POA: Diagnosis not present

## 2023-10-10 DIAGNOSIS — J449 Chronic obstructive pulmonary disease, unspecified: Secondary | ICD-10-CM | POA: Diagnosis not present

## 2023-10-10 DIAGNOSIS — J9602 Acute respiratory failure with hypercapnia: Secondary | ICD-10-CM | POA: Diagnosis not present

## 2023-10-10 LAB — I-STAT VENOUS BLOOD GAS, ED
Acid-Base Excess: 10 mmol/L — ABNORMAL HIGH (ref 0.0–2.0)
Bicarbonate: 33.8 mmol/L — ABNORMAL HIGH (ref 20.0–28.0)
Calcium, Ion: 1.14 mmol/L — ABNORMAL LOW (ref 1.15–1.40)
HCT: 29 % — ABNORMAL LOW (ref 36.0–46.0)
Hemoglobin: 9.9 g/dL — ABNORMAL LOW (ref 12.0–15.0)
O2 Saturation: 95 %
Potassium: 4.7 mmol/L (ref 3.5–5.1)
Sodium: 140 mmol/L (ref 135–145)
TCO2: 35 mmol/L — ABNORMAL HIGH (ref 22–32)
pCO2, Ven: 39 mm[Hg] — ABNORMAL LOW (ref 44–60)
pH, Ven: 7.546 — ABNORMAL HIGH (ref 7.25–7.43)
pO2, Ven: 64 mm[Hg] — ABNORMAL HIGH (ref 32–45)

## 2023-10-10 LAB — RESPIRATORY PANEL BY PCR
Adenovirus: NOT DETECTED
Bordetella Parapertussis: NOT DETECTED
Bordetella pertussis: NOT DETECTED
Chlamydophila pneumoniae: NOT DETECTED
Coronavirus 229E: NOT DETECTED
Coronavirus HKU1: NOT DETECTED
Coronavirus NL63: NOT DETECTED
Coronavirus OC43: DETECTED — AB
Influenza A: NOT DETECTED
Influenza B: NOT DETECTED
Metapneumovirus: NOT DETECTED
Mycoplasma pneumoniae: NOT DETECTED
Parainfluenza Virus 1: NOT DETECTED
Parainfluenza Virus 2: NOT DETECTED
Parainfluenza Virus 3: NOT DETECTED
Parainfluenza Virus 4: NOT DETECTED
Respiratory Syncytial Virus: NOT DETECTED
Rhinovirus / Enterovirus: DETECTED — AB

## 2023-10-10 LAB — CBC WITH DIFFERENTIAL/PLATELET
Abs Immature Granulocytes: 0.05 10*3/uL (ref 0.00–0.07)
Basophils Absolute: 0 10*3/uL (ref 0.0–0.1)
Basophils Relative: 0 %
Eosinophils Absolute: 0 10*3/uL (ref 0.0–0.5)
Eosinophils Relative: 0 %
HCT: 31.3 % — ABNORMAL LOW (ref 36.0–46.0)
Hemoglobin: 9.3 g/dL — ABNORMAL LOW (ref 12.0–15.0)
Immature Granulocytes: 1 %
Lymphocytes Relative: 3 %
Lymphs Abs: 0.3 10*3/uL — ABNORMAL LOW (ref 0.7–4.0)
MCH: 28.8 pg (ref 26.0–34.0)
MCHC: 29.7 g/dL — ABNORMAL LOW (ref 30.0–36.0)
MCV: 96.9 fL (ref 80.0–100.0)
Monocytes Absolute: 0.3 10*3/uL (ref 0.1–1.0)
Monocytes Relative: 3 %
Neutro Abs: 10.3 10*3/uL — ABNORMAL HIGH (ref 1.7–7.7)
Neutrophils Relative %: 93 %
Platelets: 336 10*3/uL (ref 150–400)
RBC: 3.23 MIL/uL — ABNORMAL LOW (ref 3.87–5.11)
RDW: 13.4 % (ref 11.5–15.5)
WBC: 11 10*3/uL — ABNORMAL HIGH (ref 4.0–10.5)
nRBC: 0 % (ref 0.0–0.2)

## 2023-10-10 LAB — COMPREHENSIVE METABOLIC PANEL
ALT: 14 U/L (ref 0–44)
AST: 23 U/L (ref 15–41)
Albumin: 2.2 g/dL — ABNORMAL LOW (ref 3.5–5.0)
Alkaline Phosphatase: 78 U/L (ref 38–126)
Anion gap: 14 (ref 5–15)
BUN: 19 mg/dL (ref 8–23)
CO2: 28 mmol/L (ref 22–32)
Calcium: 9.3 mg/dL (ref 8.9–10.3)
Chloride: 100 mmol/L (ref 98–111)
Creatinine, Ser: 0.65 mg/dL (ref 0.44–1.00)
GFR, Estimated: >60 mL/min (ref >60)
Glucose, Bld: 142 mg/dL — ABNORMAL HIGH (ref 70–99)
Potassium: 4.8 mmol/L (ref 3.5–5.1)
Sodium: 142 mmol/L (ref 135–145)
Total Bilirubin: 0.7 mg/dL (ref 0.0–1.2)
Total Protein: 6.1 g/dL — ABNORMAL LOW (ref 6.5–8.1)

## 2023-10-10 LAB — URINALYSIS, ROUTINE W REFLEX MICROSCOPIC
Bilirubin Urine: NEGATIVE
Glucose, UA: NEGATIVE mg/dL
Ketones, ur: 20 mg/dL — AB
Leukocytes,Ua: NEGATIVE
Nitrite: NEGATIVE
Protein, ur: 100 mg/dL — AB
Specific Gravity, Urine: 1.025 (ref 1.005–1.030)
pH: 5 (ref 5.0–8.0)

## 2023-10-10 LAB — POCT I-STAT 7, (LYTES, BLD GAS, ICA,H+H)
Acid-Base Excess: 8 mmol/L — ABNORMAL HIGH (ref 0.0–2.0)
Bicarbonate: 33 mmol/L — ABNORMAL HIGH (ref 20.0–28.0)
Calcium, Ion: 1.26 mmol/L (ref 1.15–1.40)
HCT: 30 % — ABNORMAL LOW (ref 36.0–46.0)
Hemoglobin: 10.2 g/dL — ABNORMAL LOW (ref 12.0–15.0)
O2 Saturation: 98 %
Patient temperature: 38.2
Potassium: 4.1 mmol/L (ref 3.5–5.1)
Sodium: 140 mmol/L (ref 135–145)
TCO2: 34 mmol/L — ABNORMAL HIGH (ref 22–32)
pCO2 arterial: 48.9 mm[Hg] — ABNORMAL HIGH (ref 32–48)
pH, Arterial: 7.442 (ref 7.35–7.45)
pO2, Arterial: 104 mm[Hg] (ref 83–108)

## 2023-10-10 LAB — CBC
HCT: 31.6 % — ABNORMAL LOW (ref 36.0–46.0)
Hemoglobin: 9.4 g/dL — ABNORMAL LOW (ref 12.0–15.0)
MCH: 28.4 pg (ref 26.0–34.0)
MCHC: 29.7 g/dL — ABNORMAL LOW (ref 30.0–36.0)
MCV: 95.5 fL (ref 80.0–100.0)
Platelets: 376 10*3/uL (ref 150–400)
RBC: 3.31 MIL/uL — ABNORMAL LOW (ref 3.87–5.11)
RDW: 13.4 % (ref 11.5–15.5)
WBC: 11.4 10*3/uL — ABNORMAL HIGH (ref 4.0–10.5)
nRBC: 0.3 % — ABNORMAL HIGH (ref 0.0–0.2)

## 2023-10-10 LAB — MAGNESIUM: Magnesium: 2.7 mg/dL — ABNORMAL HIGH (ref 1.7–2.4)

## 2023-10-10 LAB — I-STAT ARTERIAL BLOOD GAS, ED
Acid-Base Excess: 11 mmol/L — ABNORMAL HIGH (ref 0.0–2.0)
Bicarbonate: 36.1 mmol/L — ABNORMAL HIGH (ref 20.0–28.0)
Calcium, Ion: 1.25 mmol/L (ref 1.15–1.40)
HCT: 29 % — ABNORMAL LOW (ref 36.0–46.0)
Hemoglobin: 9.9 g/dL — ABNORMAL LOW (ref 12.0–15.0)
O2 Saturation: 98 %
Patient temperature: 98.4
Potassium: 4.6 mmol/L (ref 3.5–5.1)
Sodium: 140 mmol/L (ref 135–145)
TCO2: 38 mmol/L — ABNORMAL HIGH (ref 22–32)
pCO2 arterial: 50.9 mm[Hg] — ABNORMAL HIGH (ref 32–48)
pH, Arterial: 7.459 — ABNORMAL HIGH (ref 7.35–7.45)
pO2, Arterial: 109 mm[Hg] — ABNORMAL HIGH (ref 83–108)

## 2023-10-10 LAB — BASIC METABOLIC PANEL
Anion gap: 15 (ref 5–15)
BUN: 19 mg/dL (ref 8–23)
CO2: 30 mmol/L (ref 22–32)
Calcium: 9.7 mg/dL (ref 8.9–10.3)
Chloride: 98 mmol/L (ref 98–111)
Creatinine, Ser: 0.73 mg/dL (ref 0.44–1.00)
GFR, Estimated: >60 mL/min (ref >60)
Glucose, Bld: 164 mg/dL — ABNORMAL HIGH (ref 70–99)
Potassium: 4.7 mmol/L (ref 3.5–5.1)
Sodium: 143 mmol/L (ref 135–145)

## 2023-10-10 LAB — STREP PNEUMONIAE URINARY ANTIGEN: Strep Pneumo Urinary Antigen: POSITIVE — AB

## 2023-10-10 LAB — CBG MONITORING, ED: Glucose-Capillary: 129 mg/dL — ABNORMAL HIGH (ref 70–99)

## 2023-10-10 LAB — GLUCOSE, CAPILLARY
Glucose-Capillary: 109 mg/dL — ABNORMAL HIGH (ref 70–99)
Glucose-Capillary: 112 mg/dL — ABNORMAL HIGH (ref 70–99)
Glucose-Capillary: 128 mg/dL — ABNORMAL HIGH (ref 70–99)
Glucose-Capillary: 133 mg/dL — ABNORMAL HIGH (ref 70–99)
Glucose-Capillary: 155 mg/dL — ABNORMAL HIGH (ref 70–99)

## 2023-10-10 LAB — BRAIN NATRIURETIC PEPTIDE: B Natriuretic Peptide: 72.2 pg/mL (ref 0.0–100.0)

## 2023-10-10 LAB — TROPONIN I (HIGH SENSITIVITY)
Troponin I (High Sensitivity): 9 ng/L (ref <18)
Troponin I (High Sensitivity): 43 ng/L — ABNORMAL HIGH (ref <18)

## 2023-10-10 LAB — HIV ANTIBODY (ROUTINE TESTING W REFLEX): HIV Screen 4th Generation wRfx: NONREACTIVE

## 2023-10-10 LAB — MRSA NEXT GEN BY PCR, NASAL: MRSA by PCR Next Gen: NOT DETECTED

## 2023-10-10 LAB — PHOSPHORUS: Phosphorus: 2.2 mg/dL — ABNORMAL LOW (ref 2.5–4.6)

## 2023-10-10 MED ORDER — FENTANYL 2500MCG IN NS 250ML (10MCG/ML) PREMIX INFUSION
0.0000 ug/h | INTRAVENOUS | Status: DC
  Administered 2023-10-10: 25 ug/h via INTRAVENOUS
  Filled 2023-10-10 (×3): qty 250

## 2023-10-10 MED ORDER — ALBUTEROL SULFATE (2.5 MG/3ML) 0.083% IN NEBU
2.5000 mg | INHALATION_SOLUTION | RESPIRATORY_TRACT | Status: DC | PRN
  Administered 2023-10-11 – 2023-10-16 (×10): 2.5 mg via RESPIRATORY_TRACT
  Filled 2023-10-10 (×10): qty 3

## 2023-10-10 MED ORDER — PROPOFOL 10 MG/ML IV BOLUS
INTRAVENOUS | Status: AC | PRN
  Administered 2023-10-10 (×2): 20 ug via INTRAVENOUS

## 2023-10-10 MED ORDER — METHYLPREDNISOLONE SODIUM SUCC 125 MG IJ SOLR
60.0000 mg | Freq: Two times a day (BID) | INTRAMUSCULAR | Status: DC
  Administered 2023-10-10: 60 mg via INTRAVENOUS
  Filled 2023-10-10: qty 2

## 2023-10-10 MED ORDER — ETOMIDATE 2 MG/ML IV SOLN
0.3000 mg/kg | Freq: Once | INTRAVENOUS | Status: AC
Start: 2023-10-10 — End: 2023-10-10
  Administered 2023-10-10: 14.7 mg via INTRAVENOUS
  Filled 2023-10-10: qty 10

## 2023-10-10 MED ORDER — SUCCINYLCHOLINE CHLORIDE 200 MG/10ML IV SOSY
50.0000 mg | PREFILLED_SYRINGE | Freq: Once | INTRAVENOUS | Status: AC
  Administered 2023-10-10: 50 mg via INTRAVENOUS
  Filled 2023-10-10: qty 10

## 2023-10-10 MED ORDER — FENTANYL CITRATE PF 50 MCG/ML IJ SOSY
PREFILLED_SYRINGE | INTRAMUSCULAR | Status: AC | PRN
  Administered 2023-10-10 (×2): 25 ug via INTRAVENOUS

## 2023-10-10 MED ORDER — SODIUM CHLORIDE 0.9 % IV SOLN
250.0000 mL | INTRAVENOUS | Status: AC
  Administered 2023-10-10: 250 mL via INTRAVENOUS

## 2023-10-10 MED ORDER — METHYLPREDNISOLONE SODIUM SUCC 40 MG IJ SOLR
40.0000 mg | INTRAMUSCULAR | Status: DC
  Administered 2023-10-11 – 2023-10-12 (×2): 40 mg via INTRAVENOUS
  Filled 2023-10-10 (×2): qty 1

## 2023-10-10 MED ORDER — INSULIN ASPART 100 UNIT/ML IJ SOLN
0.0000 [IU] | INTRAMUSCULAR | Status: DC
  Administered 2023-10-10 (×2): 2 [IU] via SUBCUTANEOUS
  Administered 2023-10-10: 3 [IU] via SUBCUTANEOUS
  Administered 2023-10-10 – 2023-10-11 (×2): 2 [IU] via SUBCUTANEOUS
  Administered 2023-10-12: 3 [IU] via SUBCUTANEOUS
  Administered 2023-10-12: 2 [IU] via SUBCUTANEOUS
  Administered 2023-10-12 – 2023-10-13 (×3): 3 [IU] via SUBCUTANEOUS
  Administered 2023-10-13 – 2023-10-16 (×8): 2 [IU] via SUBCUTANEOUS
  Administered 2023-10-16: 3 [IU] via SUBCUTANEOUS
  Administered 2023-10-16 – 2023-10-17 (×2): 2 [IU] via SUBCUTANEOUS
  Administered 2023-10-17: 3 [IU] via SUBCUTANEOUS
  Administered 2023-10-17: 2 [IU] via SUBCUTANEOUS
  Administered 2023-10-17: 3 [IU] via SUBCUTANEOUS
  Administered 2023-10-17: 2 [IU] via SUBCUTANEOUS
  Administered 2023-10-17 – 2023-10-18 (×2): 3 [IU] via SUBCUTANEOUS
  Administered 2023-10-18 – 2023-10-20 (×10): 2 [IU] via SUBCUTANEOUS
  Administered 2023-10-20: 3 [IU] via SUBCUTANEOUS
  Administered 2023-10-20 – 2023-10-21 (×3): 2 [IU] via SUBCUTANEOUS
  Administered 2023-10-22: 3 [IU] via SUBCUTANEOUS
  Administered 2023-10-22: 2 [IU] via SUBCUTANEOUS
  Administered 2023-10-23: 3 [IU] via SUBCUTANEOUS
  Administered 2023-10-23 (×2): 2 [IU] via SUBCUTANEOUS
  Administered 2023-10-23: 3 [IU] via SUBCUTANEOUS
  Administered 2023-10-24 (×2): 2 [IU] via SUBCUTANEOUS
  Administered 2023-10-25: 3 [IU] via SUBCUTANEOUS
  Administered 2023-10-25 (×2): 2 [IU] via SUBCUTANEOUS
  Administered 2023-10-26 (×2): 3 [IU] via SUBCUTANEOUS
  Administered 2023-10-26 – 2023-10-29 (×11): 2 [IU] via SUBCUTANEOUS
  Administered 2023-10-29: 3 [IU] via SUBCUTANEOUS
  Administered 2023-10-29 – 2023-10-30 (×6): 2 [IU] via SUBCUTANEOUS

## 2023-10-10 MED ORDER — ALBUTEROL SULFATE (2.5 MG/3ML) 0.083% IN NEBU
10.0000 mg/h | INHALATION_SOLUTION | Freq: Once | RESPIRATORY_TRACT | Status: AC
  Administered 2023-10-10: 10 mg/h via RESPIRATORY_TRACT
  Filled 2023-10-10: qty 12

## 2023-10-10 MED ORDER — SODIUM CHLORIDE 0.9 % IV SOLN
2.0000 g | INTRAVENOUS | Status: AC
  Administered 2023-10-10 – 2023-10-14 (×5): 2 g via INTRAVENOUS
  Filled 2023-10-10 (×5): qty 20

## 2023-10-10 MED ORDER — SODIUM CHLORIDE 0.9 % IV SOLN
500.0000 mg | INTRAVENOUS | Status: DC
  Administered 2023-10-10 – 2023-10-12 (×3): 500 mg via INTRAVENOUS
  Filled 2023-10-10 (×3): qty 5

## 2023-10-10 MED ORDER — DOCUSATE SODIUM 100 MG PO CAPS: 100.0000 mg | ORAL_CAPSULE | Freq: Two times a day (BID) | ORAL | Status: DC | PRN

## 2023-10-10 MED ORDER — FAMOTIDINE 20 MG PO TABS
20.0000 mg | ORAL_TABLET | Freq: Two times a day (BID) | ORAL | Status: DC
  Administered 2023-10-10 (×2): 20 mg
  Filled 2023-10-10 (×2): qty 1

## 2023-10-10 MED ORDER — CHLORHEXIDINE GLUCONATE CLOTH 2 % EX PADS
6.0000 | MEDICATED_PAD | Freq: Every day | CUTANEOUS | Status: DC
  Administered 2023-10-10 – 2023-10-14 (×5): 6 via TOPICAL

## 2023-10-10 MED ORDER — HEPARIN SODIUM (PORCINE) 5000 UNIT/ML IJ SOLN
5000.0000 [IU] | Freq: Three times a day (TID) | INTRAMUSCULAR | Status: DC
  Administered 2023-10-10 – 2023-10-23 (×42): 5000 [IU] via SUBCUTANEOUS
  Filled 2023-10-10 (×42): qty 1

## 2023-10-10 MED ORDER — SODIUM PHOSPHATES 45 MMOLE/15ML IV SOLN
15.0000 mmol | Freq: Once | INTRAVENOUS | Status: AC
  Administered 2023-10-10: 15 mmol via INTRAVENOUS
  Filled 2023-10-10: qty 5

## 2023-10-10 MED ORDER — PROPOFOL 1000 MG/100ML IV EMUL
5.0000 ug/kg/min | INTRAVENOUS | Status: DC
  Administered 2023-10-10: 20 ug/kg/min via INTRAVENOUS
  Administered 2023-10-10: 15 ug/kg/min via INTRAVENOUS
  Administered 2023-10-11 (×2): 40 ug/kg/min via INTRAVENOUS
  Administered 2023-10-12: 20 ug/kg/min via INTRAVENOUS
  Filled 2023-10-10 (×6): qty 100

## 2023-10-10 MED ORDER — LACTATED RINGERS IV SOLN: INTRAVENOUS | Status: AC

## 2023-10-10 MED ORDER — DOCUSATE SODIUM 50 MG/5ML PO LIQD
100.0000 mg | Freq: Two times a day (BID) | ORAL | Status: DC | PRN
  Administered 2023-10-10: 100 mg
  Filled 2023-10-10: qty 10

## 2023-10-10 MED ORDER — KETAMINE HCL 10 MG/ML IJ SOLN: 1.0000 mg/kg | Freq: Once | INTRAMUSCULAR | Status: DC

## 2023-10-10 MED ORDER — ARFORMOTEROL TARTRATE 15 MCG/2ML IN NEBU
15.0000 ug | INHALATION_SOLUTION | Freq: Two times a day (BID) | RESPIRATORY_TRACT | Status: DC
Start: 2023-10-10 — End: 2023-10-30
  Administered 2023-10-10 – 2023-10-30 (×41): 15 ug via RESPIRATORY_TRACT
  Filled 2023-10-10 (×41): qty 2

## 2023-10-10 MED ORDER — FENTANYL CITRATE PF 50 MCG/ML IJ SOSY
50.0000 ug | PREFILLED_SYRINGE | INTRAMUSCULAR | Status: DC | PRN
  Administered 2023-10-10: 25 ug via INTRAVENOUS
  Administered 2023-10-11: 50 ug via INTRAVENOUS
  Filled 2023-10-10: qty 1

## 2023-10-10 MED ORDER — ORAL CARE MOUTH RINSE
15.0000 mL | OROMUCOSAL | Status: DC
  Administered 2023-10-10 – 2023-10-30 (×242): 15 mL via OROMUCOSAL

## 2023-10-10 MED ORDER — NOREPINEPHRINE 4 MG/250ML-% IV SOLN
0.0000 ug/min | INTRAVENOUS | Status: DC
  Administered 2023-10-10: 2 ug/min via INTRAVENOUS
  Filled 2023-10-10: qty 250

## 2023-10-10 MED ORDER — REVEFENACIN 175 MCG/3ML IN SOLN
175.0000 ug | Freq: Every day | RESPIRATORY_TRACT | Status: DC
Start: 2023-10-10 — End: 2023-10-30
  Administered 2023-10-10 – 2023-10-30 (×20): 175 ug via RESPIRATORY_TRACT
  Filled 2023-10-10 (×20): qty 3

## 2023-10-10 MED ORDER — POLYETHYLENE GLYCOL 3350 17 G PO PACK: 17.0000 g | PACK | Freq: Every day | ORAL | Status: DC | PRN

## 2023-10-10 MED ORDER — ORAL CARE MOUTH RINSE: 15.0000 mL | OROMUCOSAL | Status: DC | PRN

## 2023-10-10 MED ORDER — LACTATED RINGERS IV BOLUS
1000.0000 mL | Freq: Once | INTRAVENOUS | Status: AC
  Administered 2023-10-10: 1000 mL via INTRAVENOUS

## 2023-10-10 MED ORDER — BUDESONIDE 0.5 MG/2ML IN SUSP
0.5000 mg | Freq: Two times a day (BID) | RESPIRATORY_TRACT | Status: DC
  Administered 2023-10-10 – 2023-10-30 (×41): 0.5 mg via RESPIRATORY_TRACT
  Filled 2023-10-10 (×41): qty 2

## 2023-10-10 MED ORDER — ACETAMINOPHEN 325 MG PO TABS
650.0000 mg | ORAL_TABLET | Freq: Four times a day (QID) | ORAL | Status: DC | PRN
  Administered 2023-10-10 – 2023-10-27 (×6): 650 mg
  Filled 2023-10-10 (×6): qty 2

## 2023-10-10 MED ORDER — NOREPINEPHRINE 4 MG/250ML-% IV SOLN
2.0000 ug/min | INTRAVENOUS | Status: DC
  Administered 2023-10-11: 3 ug/min via INTRAVENOUS
  Filled 2023-10-10: qty 250

## 2023-10-10 MED ORDER — SODIUM CHLORIDE 0.9 % IV BOLUS
1000.0000 mL | Freq: Once | INTRAVENOUS | Status: AC
  Administered 2023-10-10: 1000 mL via INTRAVENOUS

## 2023-10-10 NOTE — ED Provider Notes (Signed)
 Patient transferred from outside facility with COPD exacerbation.  She failed BiPAP and needed intubation.  She arrives intubated with [facility].  Calm on ventilator saturating in the mid 90s with clear lungs but diminished breath sounds.  Blood pressure in the 90s systolic.  Will check ABG. Will check chest x-ray and EKG.  Patient will need admission to the ICU  Discussed with Dr. Layman of critical care.   Carita Senior, MD 10/10/23 [phone]

## 2023-10-10 NOTE — TOC CM/SW Note (Signed)
 Transition of Care [facility]) - Inpatient Brief Assessment   Patient Details  Name: Carla Rivera MRN: [id number] Date of Birth: 1956-01-27  [facility] CM/SW Contact:    Inocente GORMAN Kindle, LCSW Phone Number: 10/10/2023, 4:19 PM   Clinical Narrative: Patient admitted from home and is currently intubated. Please place [facility] consult if needs arise as medical needs resolve.    Transition of Care Asessment: Insurance and Status: Insurance coverage has been reviewed Patient has primary care physician: Yes Home environment has been reviewed: From home Prior level of function:: Independent Prior/Current Home Services: No current home services Social Drivers of Health Review: SDOH reviewed no interventions necessary Readmission risk has been reviewed: Yes Transition of care needs: no transition of care needs at this time

## 2023-10-10 NOTE — ED Provider Notes (Signed)
 Clinical Course as of 10/10/23 0101  Tue Oct 10, 2023  0008 Patient presented with and was admitted with COPD exacerbation and pneumonia. Despite treatments including IV antibiotics, recurrent DuoNebs, continuous albuterol  treatment, magnesium , Solu-Medrol , patient continued to worsen in her respiratory status throughout the day. Her air movement is very poor and she has mild wheezing, extremely poor air movement, and diffuse rhonchi bilaterally. Her  Patient did not want to do BiPAP in spite of worsening respiratory status due to claustrophobia and not liking the mask, but eventually was convinced by myself, RT, and PA.  Patient required Ativan  in order to use the BiPAP.  After initiating the BiPAP, patient's respiratory status continued to worsen and she became increasingly tachypneic to greater than 40 breaths/min.  Discussed extensively with patient's son at bedside.  Patient's son is extremely upset, claiming that the BiPAP made her respiratory status worse.  I reassured him that it is not the BiPAP that made it worse but rather her worsening clinical status despite all of our treatments over the course of the day. Patient also became sleepy with ativan  however her respiratory rate did not decline, instead it rose. I think likely her acidosis is worsening and her respiratory rate and mental status are worsening. She is an extremely difficult stick and we have not been able to obtain a blood gas. PA Charlott had discussed with the patient earlier in the day who stated that she would like everything done when asked about intubation and CODE STATUS.  I relayed this to the son who states he is not sure.  I discussed with the son repeatedly as well as his wife over the phone and relayed that likely if we do not intubate her she will die.  I also relayed that even if we do intubate her it is very possible that she will die. Patient's son agrees to intubation. I feel also that the patient's preference was  intubation. Patient will be moved to resus bed for intubation.  [HN]  0053 Patient intubated without incident.  Confirmed by auscultation as well as by chest x-ray.  Postintubation sedation with propofol  and fentanyl .  Patient is requiring repeated boluses of propofol  in order to stay sedated.  Very important to ensure adequate sedation and compliance with the ventilator.  Patient then becomes expectedly mildly hypotensive.  Will start 1 L fluid bolus.  Will start Levophed  if her hypotension does not improve.  [facility] is here at bedside awaiting to take the patient. [HN]    Clinical Course User Index [HN] Carla Sid SAILOR, MD     .Critical Care  Performed by: Carla Sid SAILOR, MD Authorized by: Carla Sid SAILOR, MD   Critical care provider statement:    Critical care time (minutes):  104   Critical care was necessary to treat or prevent imminent or life-threatening deterioration of the following conditions:  Respiratory failure and sepsis   Critical care was time spent personally by me on the following activities:  Development of treatment plan with patient or surrogate, discussions with consultants, evaluation of patient's response to treatment, examination of patient, ordering and review of laboratory studies, ordering and review of radiographic studies, ordering and performing treatments and interventions, pulse oximetry, re-evaluation of patient's condition, review of old charts, obtaining history from patient or surrogate and ventilator management   I assumed direction of critical care for this patient from another provider in my specialty: yes     Care discussed with: accepting provider at another facility  Procedure Name: Intubation Date/Time: 10/10/2023 12:50 AM  Performed by: Carla Rivera, MDPre-anesthesia Checklist: Patient identified, Patient being monitored, Emergency Drugs available, Timeout performed and Suction available Oxygen  Delivery Method: Non-rebreather  mask Preoxygenation: Pre-oxygenation with 100% oxygen  Induction Type: Rapid sequence Ventilation: Mask ventilation without difficulty Laryngoscope Size: Glidescope and 3 Grade View: Grade I Tube size: 7.0 mm Number of attempts: 1 Airway Equipment and Method: Video-laryngoscopy Placement Confirmation: ETT inserted through vocal cords under direct vision, CO2 detector and Breath sounds checked- equal and bilateral Secured at: 22 cm Tube secured with: Tape Dental Injury: Teeth and Oropharynx as per pre-operative assessment  Difficulty Due To: Difficulty was unanticipated        Carla Sid SAILOR, MD 10/10/23 [phone]

## 2023-10-10 NOTE — Progress Notes (Signed)
 [facility] Physician-Brief Progress Note Patient Name: Carla Rivera DOB: [birth year] MRN: [id number]   Date of Service  10/10/2023  HPI/Events of Note  68 year old female with a history of COPD on 2 L of oxygen  who developed worsening dyspnea complicated by hypoxemic respiratory failure eventually requiring BiPAP and intubation.  On presentation, she is tachycardic but otherwise normal vitals.  Currently on the ventilator with 100% SpO2 with 40% FiO2.  Currently being treated with low-dose norepinephrine , propofol , and fentanyl .  Results consistent with alkalemia normocytic anemia. Radiograph shows endotracheal tube 5 cm above the carina.  OG tube in the esophagus.  eICU Interventions  Endotracheal tube was advanced 1 cm.  Advancing OG tube.  Will redo x-ray.  Maintain scheduled steroids and long-acting SVNs.  Daily spontaneous awakening trials/breathing trials  Norepinephrine  as needed to maintain MAP greater than 65  DVT prophylaxis with heparin  GI prophylaxis with famotidine      Intervention Category Evaluation Type: New Patient Evaluation  Treyton Slimp 10/10/2023, 4:31 AM

## 2023-10-10 NOTE — Progress Notes (Signed)
 Patient given 10mg /hr albuterol nebulizer

## 2023-10-10 NOTE — H&P (Signed)
 NAME:  Carla Rivera, MRN:  [id number], DOB:  08/31/1956, LOS: 0 ADMISSION DATE:  10/09/2023, CONSULTATION DATE:  1/7 REFERRING MD:  Dr. 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 Theotis.  Her home regimen is composed of Symbicort  and scheduled DuoNebs.  She was in her usual state of health until approximately 1/4 when her baseline dyspnea worsened gradually causing her to present to med [facility] on 1/6 with these complaints.  She has been around grandchildren who have had cold symptoms recently.  Upon arrival to the med center her O2 sats were in the high 70s which did improve when oxygen  was increased to 6 L to 95%.  There was concern for COPD exacerbation and she was treated with the usual modalities including steroids, nebulized bronchodilators, magnesium , and antibiotics.  Chest radiograph concerning for pneumonia.  Despite the aforementioned treatment her condition continued to worsen as did her oxygen  requirement as well.  Ultimately she required BiPAP which she was unfortunately able to tolerate.  She was transferred ED to ED to [facility] where PCCM was asked to admit to ICU.  Pertinent  Medical History   has a past medical history of Arthritis, COPD (chronic obstructive pulmonary disease) (HCC), Depression, and HTN (hypertension).   Significant Hospital Events: Including procedures, antibiotic start and stop dates in addition to other pertinent events     Interim History / Subjective:    Objective   Blood pressure 96/65, pulse (!) 103, temperature (!) 93.8 F (34.3 C), temperature source Other (Comment), resp. rate (!) 24, height 5' (1.524 m), weight 49 kg, SpO2 100%.    Vent Mode: PRVC FiO2 (%):  [40 %-45 %] 40 % Set Rate:  [24 bmp] 24 bmp Vt Set:  [380 mL] 380 mL PEEP:   [5 cmH20] 5 cmH20 Plateau Pressure:  [29 cmH20] 29 cmH20   Intake/Output Summary (Last 24 hours) at 10/10/2023 0255 Last data filed at 10/10/2023 0010 Gross per 24 hour  Intake 398.97 ml  Output --  Net 398.97 ml   Filed Weights   10/09/23 1651  Weight: 49 kg    Examination: General: Elderly appearing female on mechanical ventilator HENT: Normocephalic, atraumatic, PERRL, no JVD Lungs: Coarse wheeze throughout, prolonged expiratory phase. Cardiovascular: Regular rate and rhythm Abdomen: Soft, nontender, nondistended Extremities: No acute deformity or edema Neuro: Sedated GU: Foley  Resolved Hospital Problem list     Assessment & Plan:   Acute on chronic respiratory failure with hypoxia and hypercarbia Acute exacerbation of COPD: End-stage COPD at baseline on home oxygen  2 L Community-acquired pneumonia versus viral pneumonia -Full vent support, ABG reviewed and settings adjusted -Empiric antibiotics -RVP -Blood and sputum cultures -10 mg hour-long albuterol  neb now -Scheduled budesonide , Brovana , Yupelri  -IV steroids -Propofol  and fentanyl  infusions for RASS goal 0 to -1  Hypotension -Suspect sedation related -Minimize sedation as able  -Holding home antihypertensives  Anemia: Hemoglobin at baseline -Monitor CBC   Best Practice (right click and Reselect all SmartList Selections daily)   Diet/type: NPO DVT prophylaxis prophylactic heparin   Pressure ulcer(s): N/A GI prophylaxis: H2B Lines: N/A Foley:  Yes, and it is still needed Code Status:  full code Last date of multidisciplinary goals of care discussion [ See ED note]  Labs   CBC: Recent Labs  Lab 10/09/23 1722  10/10/23 0241  WBC 13.2*  --   NEUTROABS 10.9*  --   HGB 11.7* 9.9*  HCT 38.9 29.0*  MCV 94.9  --   PLT 422*  --     Basic Metabolic Panel: Recent Labs  Lab 10/09/23 1722 10/10/23 0241  NA 142 140  K 4.0 4.6  CL 90*  --   CO2 40*  --   GLUCOSE 129*  --   BUN 23  --    CREATININE 0.67  --   CALCIUM 10.9*  --    GFR: Estimated Creatinine Clearance: 48.3 mL/min (by C-G formula based on SCr of 0.67 mg/dL). Recent Labs  Lab 10/09/23 1721 10/09/23 1722  WBC  --  13.2*  LATICACIDVEN 1.6  --     Liver Function Tests: No results for input(s): AST, ALT, ALKPHOS, BILITOT, PROT, ALBUMIN in the last 168 hours. No results for input(s): LIPASE, AMYLASE in the last 168 hours. No results for input(s): AMMONIA in the last 168 hours.  ABG    Component Value Date/Time   PHART 7.459 (H) 10/10/2023 0241   PCO2ART 50.9 (H) 10/10/2023 0241   PO2ART 109 (H) 10/10/2023 0241   HCO3 36.1 (H) 10/10/2023 0241   TCO2 38 (H) 10/10/2023 0241   O2SAT 98 10/10/2023 0241     Coagulation Profile: No results for input(s): INR, PROTIME in the last 168 hours.  Cardiac Enzymes: No results for input(s): CKTOTAL, CKMB, CKMBINDEX, TROPONINI in the last 168 hours.  HbA1C: Hgb A1c MFr Bld  Date/Time Value Ref Range Status  12/16/2022 11:56 AM 5.9 4.6 - 6.5 % Final    Comment:    Glycemic Control Guidelines for People with Diabetes:Non Diabetic:  <6%Goal of Therapy: <7%Additional Action Suggested:  >8%   10/12/2021 10:03 AM 5.8 4.6 - 6.5 % Final    Comment:    Glycemic Control Guidelines for People with Diabetes:Non Diabetic:  <6%Goal of Therapy: <7%Additional Action Suggested:  >8%     CBG: No results for input(s): GLUCAP in the last 168 hours.  Review of Systems:   Patient is encephalopathic and/or intubated; therefore, history has been obtained from chart review.    Past Medical History:  She,  has a past medical history of Arthritis, COPD (chronic obstructive pulmonary disease) (HCC), Depression, and HTN (hypertension).   Surgical History:   Past Surgical History:  Procedure Laterality Date   ECTOPIC PREGNANCY SURGERY       Social History:   reports that she has quit smoking. Her smoking use included cigarettes. She has never  used smokeless tobacco. She reports that she does not currently use alcohol  after a past usage of about 2.0 standard drinks of alcohol  per week. She reports that she does not use drugs.   Family History:  Her family history includes Cancer in her brother; Hypertension in her mother; Renal Disease in her mother.   Allergies Allergies  Allergen Reactions   Penicillins Rash, Other (See Comments) and Hives    Has patient had a PCN reaction causing immediate rash, facial/tongue/throat swelling, SOB or lightheadedness with hypotension: No Has patient had a PCN reaction causing severe rash involving mucus membranes or skin necrosis: No Has patient had a PCN reaction that required hospitalization No Has patient had a PCN reaction occurring within the last 10 years: No If all of the above answers are NO, then may proceed with Cephalosporin use.      Home Medications  Prior to Admission medications   Medication Sig Start Date  End Date Taking? Authorizing Provider  aspirin  EC 81 MG tablet Take 81 mg by mouth daily. Swallow whole.    [provider]  budesonide -formoterol  (SYMBICORT ) 160-4.5 MCG/ACT inhaler Inhale 2 puffs into the lungs 2 (two) times daily. 09/06/17   Trudy Dorn BRAVO, MD  busPIRone  (BUSPAR ) 10 MG tablet Take 1 tablet (10 mg total) by mouth 2 (two) times daily. 07/14/23   Wendolyn Jenkins Jansky, MD  escitalopram  (LEXAPRO ) 10 MG tablet Take 1 tablet (10 mg total) by mouth daily. 07/14/23   Wendolyn Jenkins Jansky, MD  fluticasone (FLONASE) 50 MCG/ACT nasal spray Place into the nose. 10/21/21 10/21/22  [provider]  hydrochlorothiazide  (HYDRODIURIL ) 25 MG tablet Take 1 tablet (25 mg total) by mouth daily. Occ takes extra for edema 07/14/23   Wendolyn Jenkins Jansky, MD  ipratropium-albuterol  (DUONEB) 0.5-2.5 (3) MG/3ML SOLN Take 3 mLs by nebulization 4 (four) times daily. 04/24/23   [provider]  LORazepam  (ATIVAN ) 0.5 MG tablet Take 1 tablet (0.5 mg total) by mouth 2  (two) times daily as needed. 04/20/23   Wendolyn Jenkins Jansky, MD  nystatin  (MYCOSTATIN ) 100000 UNIT/ML suspension Take 5 mLs (500,000 Units total) by mouth 4 (four) times daily. 04/22/20   Rancour, Garnette, MD  Potassium Chloride  ER 20 MEQ TBCR Take 1 tablet (20 mEq total) by mouth in the morning and at bedtime. 07/14/23   Wendolyn Jenkins Jansky, MD  solifenacin  (VESICARE ) 5 MG tablet Take 1 tablet (5 mg total) by mouth daily. 07/14/23   Wendolyn Jenkins Jansky, MD     Critical care time:  43 min     Deward Eastern, AGACNP-BC [facility]  See Amion for personal pager PCCM on call pager [phone] until 7pm. Please call Elink 7p-7a. [phone]  10/10/2023 3:12 AM

## 2023-10-10 NOTE — Progress Notes (Addendum)
 [facility] Physician-Brief Progress Note Patient Name: Carla Rivera DOB: 12-11-1955 MRN: [id number]   Date of Service  10/10/2023  HPI/Events of Note  68 year old female with a history of COPD on 2 L of oxygen  who developed worsening dyspnea complicated by hypoxemic respiratory failure eventually requiring BiPAP and intubation.   Fentanyl  infusion was discontinued today.  Still wincing with turns and baths.  eICU Interventions  Initiate fentanyl  pushes PRNs   0322 -minimal (85 cc) urine output since beginning of the shift.  Received a crystalloid bolus Daytime to stimulate urine production.  Off norepinephrine .  +2.7 L for the day, 3.3 L for the admission.  No intervention at this time.   Intervention Category Intermediate Interventions: Pain - evaluation and management  Carla Rivera 10/10/2023, 10:17 PM

## 2023-10-10 NOTE — Progress Notes (Signed)
 PCCM Interval Note  65 yoF with hx COPD on 2L O2 pta admitted this am with Acute on chronic hypoxic and hypercarbic respiratory failure, PNA, AECOPD, and septic shock.  Labs and imaging reviewed.   - review of repeat CXR appears ETT in right mainstem, will pull back to 23 cm at lip.  CXR in am - pending RVP and trach asp, add on urine strep/ legionella - remains on low dose peripheral NE, MAP goal > 65.  Not increasing thus far, hold on CVL placement for now - bedside assessment of IVC- dilated, hold further fluids - sedation may be contributing to some hypotension - check echo - cont CAP coverage, nebs, and decrease solumedrol to daily based on wt - cont SSI prn - RD for EN  - prn tylenol     No family at bedside   Additional CCT 20 mins   Lyle Pesa, MSN, AG-ACNP-BC [facility] 10/10/2023, 9:47 AM  See Amion for pager If no response to pager , please call [phone] until 7pm After 7:00 pm call Elink  [phone]

## 2023-10-10 NOTE — ED Notes (Signed)
 [facility] update patient transferring rooms going into ED 14 now, [facility] states truck is out Called @ 00:08

## 2023-10-10 NOTE — ED Notes (Signed)
 Patient is being bagged by BVM administered by RT.

## 2023-10-10 NOTE — Progress Notes (Signed)
 RT pulled back ETT 2cm from 25 to 23 @ lip per CCM/order.

## 2023-10-10 NOTE — ED Notes (Signed)
 Time out for RSI was completed at 0019.

## 2023-10-10 NOTE — ED Notes (Signed)
 ED TO INPATIENT HANDOFF REPORT  ED Nurse Name and Phone #: Beau Vanduzer RN [phone]  S Name/Age/Gender Carla Rivera 68 y.o. female Room/Bed: MHT14/MHT14  Code Status   Code Status: Prior  Home/SNF/Other [facility]  Is this baseline?  Patient is sedated and intubated.  Triage Complete: Triage complete  Chief Complaint Pneumonia [J18.9]  Triage Note C/o shortness of breath x 3 days, worse today. Congested cough. Wears 2 L O2 at baseline, has increased it to 3L. Slight chest pain. RT in triage to assess  Son states increased confusion as well   Allergies Allergies  Allergen Reactions   Penicillins Rash, Other (See Comments) and Hives    Has patient had a PCN reaction causing immediate rash, facial/tongue/throat swelling, SOB or lightheadedness with hypotension: No Has patient had a PCN reaction causing severe rash involving mucus membranes or skin necrosis: No Has patient had a PCN reaction that required hospitalization No Has patient had a PCN reaction occurring within the last 10 years: No If all of the above answers are NO, then may proceed with Cephalosporin use.     Level of Care/Admitting Diagnosis ED Disposition     ED Disposition  Transfer via Transport   Condition  --   Comment  Hospital Area: [facility] [[id number]] Level of Care: Telemetry [5] Admit to tele based on following criteria: Monitor for Ischemic changes Interfacility transfer: Yes May place patient in observation at [facility] or [facility]  if equivalent level of care is available:: Yes Covid Evaluation: Confirmed COVID Negative Diagnosis: Pneumonia [[id number]] Admitting Physician: FRANKY REDIA SAILOR [phone] Attending Physician: FRANKY REDIA SAILOR [email or web address].DOW          B Medical/Surgery History Past Medical History:  Diagnosis Date   Arthritis    COPD (chronic obstructive pulmonary disease) (HCC)    Depression    HTN (hypertension)    Past Surgical History:  Procedure  Laterality Date   ECTOPIC PREGNANCY SURGERY       A IV Location/Drains/Wounds Patient Lines/Drains/Airways Status     Active Line/Drains/Airways     Name Placement date Placement time Site Days   Peripheral IV 10/09/23 20 G Left Antecubital 10/09/23  1728  Antecubital  1   Peripheral IV 10/09/23 20 G Right Antecubital 10/09/23  1744  Antecubital  1   NG/OG Vented/Dual Lumen 14 Fr. Oral 47 cm 10/10/23  0100  Oral  less than 1   Airway 7 mm 10/10/23  0021  -- less than 1            Intake/Output Last 24 hours  Intake/Output Summary (Last 24 hours) at 10/10/2023 0118 Last data filed at 10/10/2023 0010 Gross per 24 hour  Intake 398.97 ml  Output --  Net 398.97 ml    Labs/Imaging Results for orders placed or performed during the hospital encounter of 10/09/23 (from the past 48 hours)  Resp panel by RT-PCR (RSV, Flu A&B, Covid) Anterior Nasal Swab     Status: None   Collection Time: 10/09/23  5:11 PM   Specimen: Anterior Nasal Swab  Result Value Ref Range   SARS Coronavirus 2 by RT PCR NEGATIVE NEGATIVE    Comment: (NOTE) SARS-CoV-2 target nucleic acids are NOT DETECTED.  The SARS-CoV-2 RNA is generally detectable in upper respiratory specimens during the acute phase of infection. The lowest concentration of SARS-CoV-2 viral copies this assay can detect is 138 copies/mL. A negative result does not preclude SARS-Cov-2 infection and should not be used as the sole  basis for treatment or other patient management decisions. A negative result may occur with  improper specimen collection/handling, submission of specimen other than nasopharyngeal swab, presence of viral mutation(s) within the areas targeted by this assay, and inadequate number of viral copies(<138 copies/mL). A negative result must be combined with clinical observations, patient history, and epidemiological information. The expected result is Negative.  Fact Sheet for Patients:   bloggercourse.com  Fact Sheet for Healthcare Providers:  seriousbroker.it  This test is no t yet approved or cleared by the [place]  FDA and  has been authorized for detection and/or diagnosis of SARS-CoV-2 by FDA under an Emergency Use Authorization (EUA). This EUA will remain  in effect (meaning this test can be used) for the duration of the COVID-19 declaration under Section 564(b)(1) of the Act, 21 U.S.C.section 360bbb-3(b)(1), unless the authorization is terminated  or revoked sooner.       Influenza A by PCR NEGATIVE NEGATIVE   Influenza B by PCR NEGATIVE NEGATIVE    Comment: (NOTE) The Xpert Xpress SARS-CoV-2/FLU/RSV plus assay is intended as an aid in the diagnosis of influenza from Nasopharyngeal swab specimens and should not be used as a sole basis for treatment. Nasal washings and aspirates are unacceptable for Xpert Xpress SARS-CoV-2/FLU/RSV testing.  Fact Sheet for Patients: bloggercourse.com  Fact Sheet for Healthcare Providers: seriousbroker.it  This test is not yet approved or cleared by the [place]  FDA and has been authorized for detection and/or diagnosis of SARS-CoV-2 by FDA under an Emergency Use Authorization (EUA). This EUA will remain in effect (meaning this test can be used) for the duration of the COVID-19 declaration under Section 564(b)(1) of the Act, 21 U.S.C. section 360bbb-3(b)(1), unless the authorization is terminated or revoked.     Resp Syncytial Virus by PCR NEGATIVE NEGATIVE    Comment: (NOTE) Fact Sheet for Patients: bloggercourse.com  Fact Sheet for Healthcare Providers: seriousbroker.it  This test is not yet approved or cleared by the [place]  FDA and has been authorized for detection and/or diagnosis of SARS-CoV-2 by FDA under an Emergency Use Authorization (EUA).  This EUA will remain in effect (meaning this test can be used) for the duration of the COVID-19 declaration under Section 564(b)(1) of the Act, 21 U.S.C. section 360bbb-3(b)(1), unless the authorization is terminated or revoked.  Performed at [facility], [address] [place], [place] [address]   Lactic acid, plasma     Status: None   Collection Time: 10/09/23  5:21 PM  Result Value Ref Range   Lactic Acid, Venous 1.6 0.5 - 1.9 mmol/L    Comment: Performed at [facility], [address] [place], [place] [address]  Basic metabolic panel     Status: Abnormal   Collection Time: 10/09/23  5:22 PM  Result Value Ref Range   Sodium 142 135 - 145 mmol/L   Potassium 4.0 3.5 - 5.1 mmol/L   Chloride 90 (L) 98 - 111 mmol/L   CO2 40 (H) 22 - 32 mmol/L   Glucose, Bld 129 (H) 70 - 99 mg/dL    Comment: Glucose reference range applies only to samples taken after fasting for at least 8 hours.   BUN 23 8 - 23 mg/dL   Creatinine, Ser 9.32 0.44 - 1.00 mg/dL   Calcium 89.0 (H) 8.9 - 10.3 mg/dL   GFR, Estimated >39 >39 mL/min    Comment: (NOTE) Calculated using the CKD-EPI Creatinine Equation (2021)    Anion gap 12 5 -  15    Comment: Performed at [facility], [address] [place] [address] [place], [place] [address]  CBC with Differential     Status: Abnormal   Collection Time: 10/09/23  5:22 PM  Result Value Ref Range   WBC 13.2 (H) 4.0 - 10.5 K/uL   RBC 4.10 3.87 - 5.11 MIL/uL   Hemoglobin 11.7 (L) 12.0 - 15.0 g/dL   HCT 61.0 63.9 - 53.9 %   MCV 94.9 80.0 - 100.0 fL   MCH 28.5 26.0 - 34.0 pg   MCHC 30.1 30.0 - 36.0 g/dL   RDW 86.7 88.4 - 84.4 %   Platelets 422 (H) 150 - 400 K/uL   nRBC 0.2 0.0 - 0.2 %   Neutrophils Relative % 83 %   Neutro Abs 10.9 (H) 1.7 - 7.7 K/uL   Lymphocytes Relative 4 %   Lymphs Abs 0.6 (L) 0.7 - 4.0 K/uL   Monocytes Relative 13 %   Monocytes Absolute 1.6 (H) 0.1 - 1.0 K/uL   Eosinophils Relative 0 %   Eosinophils Absolute 0.0 0.0 - 0.5  K/uL   Basophils Relative 0 %   Basophils Absolute 0.0 0.0 - 0.1 K/uL   Immature Granulocytes 0 %   Abs Immature Granulocytes 0.04 0.00 - 0.07 K/uL    Comment: Performed at [facility], [address] [place], [place] [address]  Brain natriuretic peptide     Status: Abnormal   Collection Time: 10/09/23  5:22 PM  Result Value Ref Range   B Natriuretic Peptide 100.3 (H) 0.0 - 100.0 pg/mL    Comment: Performed at [facility] [address] [place], [place] [address]  Culture, blood (routine x 2)     Status: None (Preliminary result)   Collection Time: 10/09/23  5:30 PM   Specimen: BLOOD RIGHT ARM  Result Value Ref Range   Specimen Description      BLOOD RIGHT ARM Performed at [facility], [address], [place], [place] [address]    Special Requests      BOTTLES DRAWN AEROBIC AND ANAEROBIC Blood Culture adequate volume Performed at [facility], [address] [place], [place] [address]    Culture PENDING    Report Status PENDING    DG Chest Portable 1 View Result Date: 10/10/2023 CLINICAL DATA:  Check endotracheal tube placement EXAM: PORTABLE CHEST 1 VIEW COMPARISON:  Film from the previous day. FINDINGS: Endotracheal tube is noted 1 cm above the carina. This could be withdrawn 1-2 cm. Cardiac shadow is within normal limits. Emphysematous changes are seen. Right middle lobe infiltrate is again identified stable from the prior exam. Bony structures are within normal limits. IMPRESSION: Stable right middle lobe infiltrate. Endotracheal tube just above the carina. This should be withdrawn slightly. Electronically Signed   By: Oneil Devonshire M.D.   On: 10/10/2023 01:11   DG Chest 2 View Result Date: 10/09/2023 CLINICAL DATA:  Cough and hypoxia.  Shortness of breath for 3 days EXAM: CHEST - 2 VIEW COMPARISON:  X-ray 04/22/2020, 04/09/2023. FINDINGS: Hyperinflation with chronic lung changes. There is new areas of consolidative opacity in the right  lung base with a question tiny right effusion, more middle lobe than lower lobe. More mild changes at the left lung base. Possible infiltrate or pneumonia. Recommend follow-up. No pneumothorax or edema. Normal cardiopericardial silhouette. Overlapping cardiac leads. IMPRESSION: Consolidative opacity seen in the right lung base with a small right effusion. More  in the middle lobe. Subtle opacity left lung base as well. Multifocal infiltrate or pneumonia is possible. Recommend follow up to confirm clearance. Electronically Signed   By: Ranell Bring M.D.   On: 10/09/2023 17:56    Pending Labs Unresulted Labs (From admission, onward)     Start     Ordered   10/09/23 1824  Culture, blood (routine x 2)  BLOOD CULTURE X 2,   STAT      10/09/23 1824            Vitals/Pain Today's Vitals   10/10/23 0103 10/10/23 0107 10/10/23 0110 10/10/23 0116  BP: 97/69 98/63 (!) 86/62 107/69  Pulse: (!) 102 97 (!) 101 (!) 106  Resp: 17 17 18 12   Temp:      SpO2: 100% 100% 100% 100%  Weight:      Height:        Isolation Precautions No active isolations  Medications Medications  albuterol  (PROVENTIL ,VENTOLIN ) solution continuous neb (10 mg/hr Nebulization New Bag/Given 10/09/23 2037)  propofol  (DIPRIVAN ) 1000 MG/100ML infusion (50 mcg/kg/min  49 kg Intravenous Rate/Dose Verify 10/10/23 0041)  fentaNYL  in NS (47mcg/ml) infusion-PREMIX (75 mcg/hr Intravenous Rate/Dose Change 10/10/23 0056)  propofol  (DIPRIVAN ) 10 mg/mL bolus/IV push (20 mcg Intravenous Given 10/10/23 0035)  fentaNYL  (SUBLIMAZE ) injection (25 mcg Intravenous Given 10/10/23 0044)  norepinephrine  (LEVOPHED ) 4mg  in (0.016 mg/mL) premix infusion (has no administration in time range)  ipratropium-albuterol  (DUONEB) 0.5-2.5 (3) MG/3ML nebulizer solution 3 mL (3 mLs Nebulization Given 10/09/23 1719)  methylPREDNISolone  sodium succinate (SOLU-MEDROL ) 125 mg/2 mL injection 125 mg (125 mg Intravenous Given 10/09/23 1741)  cefTRIAXone   (ROCEPHIN ) 1 g in sodium chloride  0.9 % 100 mL IVPB (0 g Intravenous Stopped 10/09/23 1956)  azithromycin  (ZITHROMAX ) 500 mg in sodium chloride  0.9 % 250 mL IVPB (0 mg Intravenous Stopped 10/09/23 2206)  sodium chloride  0.9 % bolus 1,000 mL (0 mLs Intravenous Stopped 10/09/23 2020)  ipratropium-albuterol  (DUONEB) 0.5-2.5 (3) MG/3ML nebulizer solution 3 mL (3 mLs Nebulization Given 10/09/23 1949)  ipratropium (ATROVENT ) nebulizer solution 0.5 mg (0.5 mg Nebulization Given 10/09/23 2037)  LORazepam  (ATIVAN ) injection 0.5 mg (0.5 mg Intravenous Given 10/09/23 2317)  magnesium  sulfate IVPB 2 g 50 mL (0 g Intravenous Stopped 10/10/23 0010)  succinylcholine  (ANECTINE ) syringe 50 mg (50 mg Intravenous Given 10/10/23 0020)  etomidate  (AMIDATE ) injection 14.7 mg (14.7 mg Intravenous Given 10/10/23 0020)  sodium chloride  0.9 % bolus 1,000 mL (1,000 mLs Intravenous New Bag/Given 10/10/23 0045)    Mobility non-ambulatory     Focused Assessments Cardiac Assessment Handoff:  Cardiac Rhythm: Sinus tachycardia Lab Results  Component Value Date   CKTOTAL 194 02/04/2013   CKMB 2.6 11/10/2013   TROPONINI <0.03 09/06/2017   No results found for: DDIMER Does the Patient currently have chest pain? No   , Pulmonary Assessment Handoff:  Lung sounds: Bilateral Breath Sounds: Diminished, Expiratory wheezes, Fine crackles O2 Device: High Flow Nasal Cannula O2 Flow Rate (L/min): 8 L/min    R Recommendations: See Admitting Provider Note  Report given to:   Additional Notes:

## 2023-10-10 NOTE — ED Notes (Signed)
 Will have vbg credited back to pt acct...KM

## 2023-10-11 ENCOUNTER — Inpatient Hospital Stay (HOSPITAL_COMMUNITY): Payer: Medicare PPO

## 2023-10-11 DIAGNOSIS — R578 Other shock: Secondary | ICD-10-CM

## 2023-10-11 DIAGNOSIS — R918 Other nonspecific abnormal finding of lung field: Secondary | ICD-10-CM | POA: Diagnosis not present

## 2023-10-11 DIAGNOSIS — Z4682 Encounter for fitting and adjustment of non-vascular catheter: Secondary | ICD-10-CM | POA: Diagnosis not present

## 2023-10-11 DIAGNOSIS — J189 Pneumonia, unspecified organism: Secondary | ICD-10-CM | POA: Diagnosis not present

## 2023-10-11 DIAGNOSIS — J439 Emphysema, unspecified: Secondary | ICD-10-CM | POA: Diagnosis not present

## 2023-10-11 DIAGNOSIS — J9690 Respiratory failure, unspecified, unspecified whether with hypoxia or hypercapnia: Secondary | ICD-10-CM | POA: Diagnosis not present

## 2023-10-11 LAB — CBC
HCT: 27.4 % — ABNORMAL LOW (ref 36.0–46.0)
Hemoglobin: 8.5 g/dL — ABNORMAL LOW (ref 12.0–15.0)
MCH: 28.7 pg (ref 26.0–34.0)
MCHC: 31 g/dL (ref 30.0–36.0)
MCV: 92.6 fL (ref 80.0–100.0)
Platelets: 306 10*3/uL (ref 150–400)
RBC: 2.96 MIL/uL — ABNORMAL LOW (ref 3.87–5.11)
RDW: 14.2 % (ref 11.5–15.5)
WBC: 17.7 10*3/uL — ABNORMAL HIGH (ref 4.0–10.5)
nRBC: 0.3 % — ABNORMAL HIGH (ref 0.0–0.2)

## 2023-10-11 LAB — ECHOCARDIOGRAM COMPLETE
AR max vel: 2.66 cm2
AV Peak grad: 2.1 mm[Hg]
Ao pk vel: 0.73 m/s
Area-P 1/2: 5.42 cm2
Height: 60 in
S' Lateral: 2.2 cm
Weight: 1710.77 [oz_av]

## 2023-10-11 LAB — BASIC METABOLIC PANEL
Anion gap: 13 (ref 5–15)
BUN: 28 mg/dL — ABNORMAL HIGH (ref 8–23)
CO2: 30 mmol/L (ref 22–32)
Calcium: 9.3 mg/dL (ref 8.9–10.3)
Chloride: 95 mmol/L — ABNORMAL LOW (ref 98–111)
Creatinine, Ser: 0.77 mg/dL (ref 0.44–1.00)
GFR, Estimated: >60 mL/min (ref >60)
Glucose, Bld: 89 mg/dL (ref 70–99)
Potassium: 4 mmol/L (ref 3.5–5.1)
Sodium: 138 mmol/L (ref 135–145)

## 2023-10-11 LAB — HEMOGLOBIN A1C
Hgb A1c MFr Bld: 5.8 % — ABNORMAL HIGH (ref 4.8–5.6)
Mean Plasma Glucose: 120 mg/dL

## 2023-10-11 LAB — GLUCOSE, CAPILLARY
Glucose-Capillary: 92 mg/dL (ref 70–99)
Glucose-Capillary: 85 mg/dL (ref 70–99)
Glucose-Capillary: 125 mg/dL — ABNORMAL HIGH (ref 70–99)
Glucose-Capillary: 117 mg/dL — ABNORMAL HIGH (ref 70–99)
Glucose-Capillary: 102 mg/dL — ABNORMAL HIGH (ref 70–99)

## 2023-10-11 LAB — PHOSPHORUS
Phosphorus: 4 mg/dL (ref 2.5–4.6)
Phosphorus: 4 mg/dL (ref 2.5–4.6)
Phosphorus: 3.9 mg/dL (ref 2.5–4.6)

## 2023-10-11 MED ORDER — ASPIRIN 81 MG PO CHEW
81.0000 mg | CHEWABLE_TABLET | Freq: Every day | ORAL | Status: DC
  Administered 2023-10-11 – 2023-10-30 (×20): 81 mg
  Filled 2023-10-11 (×20): qty 1

## 2023-10-11 MED ORDER — DEXMEDETOMIDINE HCL IN NACL 400 MCG/100ML IV SOLN
0.0000 ug/kg/h | INTRAVENOUS | Status: DC
  Administered 2023-10-11: 1.2 ug/kg/h via INTRAVENOUS
  Administered 2023-10-11: 0.4 ug/kg/h via INTRAVENOUS
  Administered 2023-10-12: 1 ug/kg/h via INTRAVENOUS
  Administered 2023-10-12 – 2023-10-13 (×2): 1.2 ug/kg/h via INTRAVENOUS
  Administered 2023-10-13: 1 ug/kg/h via INTRAVENOUS
  Filled 2023-10-11 (×5): qty 100
  Filled 2023-10-11: qty 200

## 2023-10-11 MED ORDER — LORAZEPAM 1 MG PO TABS
0.5000 mg | ORAL_TABLET | Freq: Two times a day (BID) | ORAL | Status: DC
  Administered 2023-10-11 – 2023-10-30 (×38): 0.5 mg
  Filled 2023-10-11 (×38): qty 1

## 2023-10-11 MED ORDER — THIAMINE MONONITRATE 100 MG PO TABS
100.0000 mg | ORAL_TABLET | Freq: Every day | ORAL | Status: AC
  Administered 2023-10-11 – 2023-10-15 (×5): 100 mg
  Filled 2023-10-11 (×5): qty 1

## 2023-10-11 MED ORDER — OSMOLITE 1.2 CAL PO LIQD
1000.0000 mL | ORAL | Status: DC
  Administered 2023-10-12 – 2023-10-30 (×20): 1000 mL

## 2023-10-11 MED ORDER — PANTOPRAZOLE SODIUM 40 MG IV SOLR
40.0000 mg | INTRAVENOUS | Status: DC
  Administered 2023-10-11 – 2023-10-24 (×14): 40 mg via INTRAVENOUS
  Filled 2023-10-11 (×14): qty 10

## 2023-10-11 MED ORDER — SODIUM CHLORIDE 3 % IN NEBU
4.0000 mL | INHALATION_SOLUTION | Freq: Two times a day (BID) | RESPIRATORY_TRACT | Status: AC
  Administered 2023-10-11 – 2023-10-12 (×4): 4 mL via RESPIRATORY_TRACT
  Filled 2023-10-11 (×3): qty 4

## 2023-10-11 MED ORDER — GUAIFENESIN 100 MG/5ML PO LIQD
15.0000 mL | Freq: Four times a day (QID) | ORAL | Status: DC
  Administered 2023-10-11 – 2023-10-24 (×52): 15 mL
  Filled 2023-10-11 (×53): qty 15

## 2023-10-11 MED ORDER — BISACODYL 10 MG RE SUPP
5.0000 mg | Freq: Once | RECTAL | Status: AC
  Administered 2023-10-11: 5 mg via RECTAL
  Filled 2023-10-11: qty 1

## 2023-10-11 MED ORDER — POLYETHYLENE GLYCOL 3350 17 G PO PACK
17.0000 g | PACK | Freq: Every day | ORAL | Status: DC
  Administered 2023-10-11 – 2023-10-17 (×7): 17 g
  Filled 2023-10-11 (×7): qty 1

## 2023-10-11 MED ORDER — FENTANYL CITRATE PF 50 MCG/ML IJ SOSY: 25.0000 ug | PREFILLED_SYRINGE | INTRAMUSCULAR | Status: DC | PRN

## 2023-10-11 MED ORDER — FENTANYL CITRATE PF 50 MCG/ML IJ SOSY
25.0000 ug | PREFILLED_SYRINGE | INTRAMUSCULAR | Status: DC | PRN
  Administered 2023-10-11: 50 ug via INTRAVENOUS
  Administered 2023-10-11 (×2): 75 ug via INTRAVENOUS
  Administered 2023-10-11 – 2023-10-12 (×10): 50 ug via INTRAVENOUS
  Administered 2023-10-13: 100 ug via INTRAVENOUS
  Administered 2023-10-13 (×2): 50 ug via INTRAVENOUS
  Administered 2023-10-13: 100 ug via INTRAVENOUS
  Administered 2023-10-13 (×3): 50 ug via INTRAVENOUS
  Filled 2023-10-11 (×2): qty 2
  Filled 2023-10-11 (×5): qty 1
  Filled 2023-10-11: qty 2
  Filled 2023-10-11 (×6): qty 1
  Filled 2023-10-11: qty 2
  Filled 2023-10-11 (×5): qty 1

## 2023-10-11 MED ORDER — BUSPIRONE HCL 10 MG PO TABS
10.0000 mg | ORAL_TABLET | Freq: Two times a day (BID) | ORAL | Status: DC
  Administered 2023-10-11 – 2023-10-29 (×37): 10 mg
  Filled 2023-10-11 (×40): qty 1

## 2023-10-11 MED ORDER — SODIUM CHLORIDE 3 % IN NEBU
INHALATION_SOLUTION | RESPIRATORY_TRACT | Status: AC
  Filled 2023-10-11: qty 4

## 2023-10-11 NOTE — Progress Notes (Signed)
 Attempted Echocardiogram, patient is in a procedure. Will attempt at a later time.

## 2023-10-11 NOTE — Procedures (Signed)
 Cortrak  Person Inserting Tube:  Cerys Winget T, RD Tube Type:  Cortrak - 55 inches Tube Size:  10 Tube Location:  Left nare Secured by: Bridle Technique Used to Measure Tube Placement:  Marking at nare/corner of mouth Cortrak Secured At:  93 cm   Cortrak Tube Team Note:  Consult received to place a Cortrak feeding tube.   X-ray is required, abdominal x-ray has been ordered by the Cortrak team. Please confirm tube placement before using the Cortrak tube.   If the tube becomes dislodged please keep the tube and contact the Cortrak team at [email or web address] for replacement.  If after hours and replacement cannot be delayed, place a NG tube and confirm placement with an abdominal x-ray.    Trude Ned RD, LDN Contact via [employer].

## 2023-10-11 NOTE — Progress Notes (Signed)
 Echocardiogram 2D Echocardiogram has been performed.  Carla Rivera 10/11/2023, 12:27 PM

## 2023-10-11 NOTE — Progress Notes (Signed)
 Upon initial assessment patient was found to have increased restlessness and tachypnea. Unable to wean at this time. PRN Fentanyl  given as needed, patient showed signs of relief. Propofol  titrated as needed. Patient showed signs of hypotension, Levo restarted. Cortrak placed today. Patient and family updated in plan of care. ICU status maintained.

## 2023-10-11 NOTE — Progress Notes (Signed)
 RT retracted ETT from 23 to 21 @ lip with MD @ bedside.

## 2023-10-11 NOTE — Progress Notes (Signed)
 NAME:  Carla Rivera, MRN:  [id number], DOB:  Aug 14, 1956, LOS: 1 ADMISSION DATE:  10/09/2023, CONSULTATION DATE:  1/7 REFERRING MD:  Dr. 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 Theotis.  Her home regimen is composed of Symbicort  and scheduled DuoNebs.  She was in her usual state of health until approximately 1/4 when her baseline dyspnea worsened gradually causing her to present to med [facility] on 1/6 with these complaints.  She has been around grandchildren who have had cold symptoms recently.  Upon arrival to the med center her O2 sats were in the high 70s which did improve when oxygen  was increased to 6 L to 95%.  There was concern for COPD exacerbation and she was treated with the usual modalities including steroids, nebulized bronchodilators, magnesium , and antibiotics.  Chest radiograph concerning for pneumonia.  Despite the aforementioned treatment her condition continued to worsen as did her oxygen  requirement as well.  Ultimately she required BiPAP which she was unfortunately able to tolerate.  She was transferred ED to ED to [facility] where PCCM was asked to admit to ICU.  Pertinent  Medical History   has a past medical history of Arthritis, COPD (chronic obstructive pulmonary disease) (HCC), Depression, and HTN (hypertension).   Significant Hospital Events: Including procedures, antibiotic start and stop dates in addition to other pertinent events   1/7 Admitted with resp failure, intubated, PNA, shock  Interim History / Subjective:  Off NE overnight Was off continuous sedation until bath overnight, became agitated, propofol  restarted  Objective   Blood pressure 100/69, pulse 91, temperature 99.3 F (37.4 C), resp. rate 20, height 5' (1.524 m), weight 48.5 kg, SpO2 95%.     Vent Mode: PRVC FiO2 (%):  [30 %-40 %] 30 % Set Rate:  [20 bmp] 20 bmp Vt Set:  [380 mL] 380 mL PEEP:  [5 cmH20] 5 cmH20 Plateau Pressure:  [18 cmH20-36 cmH20] 19 cmH20   Intake/Output Summary (Last 24 hours) at 10/11/2023 9281 Last data filed at 10/11/2023 0700 Gross per 24 hour  Intake 3135.65 ml  Output 285 ml  Net 2850.65 ml   Filed Weights   10/09/23 1651 10/10/23 0500 10/11/23 0243  Weight: 49 kg 45.8 kg 48.5 kg    Examination: Propofol  40> paused for assessment General:  AoC ill and cachetic appearing elderly female in bed in NAD HEENT: MM pink/moist, pupils 3/r, temporal wasting, edentulous, ++JVD Neuro: after propofol  paused, pt sitting up in bed, not following commands, cont coughing with desat on MV, MAE CV: rr, SR, no obvious murmur PULM:  MV supported, rales/ rhonchi R>L, diminished, diffuse wheeze, thick tan secretions, strong cough GI: soft, bs+, foley- cyu Extremities: warm/dry, no LE edema Skin: no rashes   Tmax 99.9 CBG range 155-92 Labs> K 4, sCr 0.77, phos 4, WBC 11.4> 17.7, Hgb/ HCT 9.4/ 31.6> 8.5/ 27  UOP 285 ml/ 24hrs (0.2 ml/kg/hr) Net +3.5L  1/6 SARS/ flu/ RSV> neg 1/6 Bcx2> ngtd 1/7 MRSA PCR> neg 1/7 RVP > +rhinovirus and coronavirus OC43 1/7 trach asp>  1/7 urine strep ag> positive  1/7 legionella  Resolved Hospital Problem list     Assessment & Plan:   Acute on chronic respiratory failure with hypoxia and hypercarbia secondary to AECOPD, viral and bacterial PNA Acute exacerbation of COPD: End-stage  COPD at baseline on home oxygen  2 L Rhinovirus and Coronavirus OC43 positive Strep PNA P:  - cont full MV support, 4-8cc/kg IBW with goal Pplat <30 and DP<15, rate to 18.  Will need extubation to BiPAP when ready - VAP prevention protocol/ PPI - PAD protocol for sedation> change propofol  to precedex  to help facilitate weaning given agitation overnight.  PRN fentanyl  w/ bowel regimen.   Appears to be on benzo's at home prn, cont to hold  -  CXR in am, intermittent VBG - sat goal > 88-94% - start SAT & SBT today as tolerated> did not tolerate WUA with coughing, agitation, desaturation, and HR 130-140s - place cortrak, post pyloric as anticipate BiPAP needs when liberated from MV - BD- yulperi, brovana , pulmicort , prn albuterol  - add HTS x 2 days, mucinex , and CPT - cont solumedrol 40mg  daily - aggressive pulmonary hygiene   Shock- likely mixed septic and sedation related HTN - remains off pressors, renal function stable.  UOP ok for her weight - WBC up but fevers improved.  Suspect reactive 2/2 steroids - MAP goal > 65 - hold on further IVF given JVD, IVC w/resp variation 1/7 am on bedside pocus.  Suspect component of RV dysfunction with endstage pulmonary disease  - echo pending - recheck EKG given ST changes on admit, trop 43 - cont holding pta hydrochlorothiazide  - restart ASA    Chronic normocytic anemia - suspect possible dilutional component.  No evidence of ABLA, cont to monitor - anemia panel in am  Hypophos - replete prn, trend BID  Protein calorie malnutrition - EN per RD   Best Practice (right click and Reselect all SmartList Selections daily)   Diet/type: NPO DVT prophylaxis prophylactic heparin   Pressure ulcer(s): N/A GI prophylaxis: PPI Lines: N/A Foley:  Yes, and it is still needed Code Status:  full code Last date of multidisciplinary goals of care discussion [ See ED note]  Pending 1/8 am  Labs   CBC: Recent Labs  Lab 10/09/23 1722 10/10/23 0241 10/10/23 0258 10/10/23 0308 10/10/23 0503 10/10/23 0519 10/11/23 0533  WBC 13.2*  --  11.0*  --  11.4*  --  17.7*  NEUTROABS 10.9*  --  10.3*  --   --   --   --   HGB 11.7*   < > 9.3* 9.9* 9.4* 10.2* 8.5*  HCT 38.9   < > 31.3* 29.0* 31.6* 30.0* 27.4*  MCV 94.9  --  96.9  --  95.5  --  92.6  PLT 422*  --  336  --  376  --  306   < > = values in this interval not displayed.    Basic Metabolic Panel: Recent Labs  Lab  10/09/23 1722 10/10/23 0241 10/10/23 0258 10/10/23 0308 10/10/23 0503 10/10/23 0519 10/11/23 0533  NA 142   < > 142 140 143 140 138  K 4.0   < > 4.8 4.7 4.7 4.1 4.0  CL 90*  --  100  --  98  --  95*  CO2 40*  --  28  --  30  --  30  GLUCOSE 129*  --  142*  --  164*  --  89  BUN 23  --  19  --  19  --  28*  CREATININE 0.67  --  0.65  --  0.73  --  0.77  CALCIUM 10.9*  --  9.3  --  9.7  --  9.3  MG  --   --   --   --  2.7*  --   --   PHOS  --   --   --   --  2.2*  --  4.0   < > = values in this interval not displayed.   GFR: Estimated Creatinine Clearance: 48.3 mL/min (by C-G formula based on SCr of 0.77 mg/dL). Recent Labs  Lab 10/09/23 1721 10/09/23 1722 10/10/23 0258 10/10/23 0503 10/11/23 0533  WBC  --  13.2* 11.0* 11.4* 17.7*  LATICACIDVEN 1.6  --   --   --   --     Liver Function Tests: Recent Labs  Lab 10/10/23 0258  AST 23  ALT 14  ALKPHOS 78  BILITOT 0.7  PROT 6.1*  ALBUMIN 2.2*   No results for input(s): LIPASE, AMYLASE in the last 168 hours. No results for input(s): AMMONIA in the last 168 hours.  ABG    Component Value Date/Time   PHART 7.442 10/10/2023 0519   PCO2ART 48.9 (H) 10/10/2023 0519   PO2ART 104 10/10/2023 0519   HCO3 33.0 (H) 10/10/2023 0519   TCO2 34 (H) 10/10/2023 0519   O2SAT 98 10/10/2023 0519     Coagulation Profile: No results for input(s): INR, PROTIME in the last 168 hours.  Cardiac Enzymes: No results for input(s): CKTOTAL, CKMB, CKMBINDEX, TROPONINI in the last 168 hours.  HbA1C: Hgb A1c MFr Bld  Date/Time Value Ref Range Status  10/10/2023 05:03 AM 5.8 (H) 4.8 - 5.6 % Final    Comment:    (NOTE)         Prediabetes: 5.7 - 6.4         Diabetes: >6.4         Glycemic control for adults with diabetes: <7.0   12/16/2022 11:56 AM 5.9 4.6 - 6.5 % Final    Comment:    Glycemic Control Guidelines for People with Diabetes:Non Diabetic:  <6%Goal of Therapy: <7%Additional Action Suggested:  >8%      CBG: Recent Labs  Lab 10/10/23 1117 10/10/23 1539 10/10/23 1951 10/10/23 2355 10/11/23 0349  GLUCAP 128* 133* 112* 109* 92   Allergies Allergies  Allergen Reactions   Penicillins Hives, Rash and Other (See Comments)     Home Medications  Prior to Admission medications   Medication Sig Start Date End Date Taking? Authorizing Provider  aspirin  EC 81 MG tablet Take 81 mg by mouth daily. Swallow whole.    [provider]  budesonide -formoterol  (SYMBICORT ) 160-4.5 MCG/ACT inhaler Inhale 2 puffs into the lungs 2 (two) times daily. 09/06/17   Trudy Dorn BRAVO, MD  busPIRone  (BUSPAR ) 10 MG tablet Take 1 tablet (10 mg total) by mouth 2 (two) times daily. 07/14/23   Wendolyn Jenkins Jansky, MD  escitalopram  (LEXAPRO ) 10 MG tablet Take 1 tablet (10 mg total) by mouth daily. 07/14/23   Wendolyn Jenkins Jansky, MD  fluticasone (FLONASE) 50 MCG/ACT nasal spray Place into the nose. 10/21/21 10/21/22  [provider]  hydrochlorothiazide  (HYDRODIURIL ) 25 MG tablet Take 1 tablet (25 mg total) by mouth daily. Occ takes extra for edema 07/14/23   Wendolyn Jenkins Jansky, MD  ipratropium-albuterol  (DUONEB) 0.5-2.5 (3) MG/3ML SOLN Take 3 mLs by nebulization 4 (four) times daily. 04/24/23   [provider]  LORazepam  (ATIVAN ) 0.5 MG tablet Take 1 tablet (0.5 mg total) by mouth 2 (two) times daily as needed. 04/20/23   Wendolyn Jenkins Jansky, MD  nystatin  (MYCOSTATIN ) 100000 UNIT/ML suspension Take 5 mLs (500,000 Units total) by mouth 4 (four) times daily. 04/22/20   Carita Senior, MD  Potassium Chloride  ER 20 MEQ TBCR Take 1 tablet (20 mEq total) by mouth in the morning and at bedtime. 07/14/23   Wendolyn Jenkins Jansky, MD  solifenacin  (VESICARE ) 5 MG tablet Take 1 tablet (5 mg total) by mouth daily. 07/14/23   Wendolyn Jenkins Jansky, MD     Critical care time:  38 min       Lyle Pesa, MSN, AG-ACNP-BC [facility] 10/11/2023, 7:18 AM  See Amion for pager If no response to  pager , please call [phone] until 7pm After 7:00 pm call Elink  [phone]

## 2023-10-11 NOTE — Progress Notes (Signed)
 Initial Nutrition Assessment  DOCUMENTATION CODES:  Severe malnutrition in context of chronic illness  INTERVENTION:  Initiate tube feeding via OGT: Osmolite 1.2 at 50 ml/h (1200 ml per day) Start at 20 and advance by 10mL q8h to goal of 50 Provides 1440 kcal, 67 gm protein, 984 ml free water daily Monitor magnesium  and phosphorus every 12 hours x 4 occurrences, MD to replete as needed, as pt is at risk for refeeding syndrome given severe malnutrition. Thiamine  100mg  x 5 days Cortrak to replace OGT today  NUTRITION DIAGNOSIS:  Severe Malnutrition related to chronic illness (COPD) as evidenced by severe fat depletion, severe muscle depletion.  GOAL:  Patient will meet greater than or equal to 90% of their needs  MONITOR:  TF tolerance, Vent status, Labs  REASON FOR ASSESSMENT:  Consult Enteral/tube feeding initiation and management  ASSESSMENT:  Pt with hx of COPD on home O2 and HTN presented to ED with worsening SOB. Found to have pneumonia on imaging and required intubation.   Patient is currently intubated on ventilator support. OGT in place, sideport in stomach on XR. Consult for cortrak tube placed by provider.   No family at bedside at the time of assessment to provide a hx. Pt with significant muscle and fat deficits on exam indicative of long-term undernutrition. Provider requests initiation of TF, pt will be at risk for refeeding syndrome. Will add thiamine  x 5 days.  MV: 7.2 L/min Temp (24hrs), Avg:99.4 F (37.4 C), Min:98.1 F (36.7 C), Max:100 F (37.8 C)  Propofol : 5.88 ml/hr (155 kcal/d)  Admit weight: 49 kg  Current weight: 45.8 kg 7.1% weight loss x 6 months, not severe (52.2 kg 04/20/23) but concerning as pt has malnutrition present.   Intake/Output Summary (Last 24 hours) at 10/11/2023 1505 Last data filed at 10/11/2023 1400 Gross per 24 hour  Intake 2671.27 ml  Output 405 ml  Net 2266.27 ml  Net IO Since Admission: 3,923.76 mL [10/11/23  1505]  Drains/Lines: UOP x 24 hours OGT 50F  Nutritionally Relevant Medications: Scheduled Meds:  insulin  aspart  0-15 Units Subcutaneous Q4H   methylPREDNISolone    40 mg Intravenous Q24H   pantoprazole  IV  40 mg Intravenous Q24H   Continuous Infusions:  azithromycin  Stopped (10/10/23 1344)   cefTRIAXone  (ROCEPHIN )  IV Stopped (10/10/23 1129)   norepinephrine  (LEVOPHED ) 3 mcg/min (10/11/23 0816)   propofol  (DIPRIVAN ) infusion 40 mcg/kg/min (10/11/23 0700)   PRN Meds: docusate, polyethylene glycol  Labs Reviewed: Chloride 95 BUN 28 CBG ranges from 85-155 mg/dL over the last 24 hours HgbA1c 5.8%  NUTRITION - FOCUSED PHYSICAL EXAM: Flowsheet Row Most Recent Value  Orbital Region Severe depletion  Upper Arm Region Severe depletion  Thoracic and Lumbar Region Moderate depletion  Buccal Region Severe depletion  Temple Region Severe depletion  Clavicle Bone Region Moderate depletion  Clavicle and Acromion Bone Region Severe depletion  Scapular Bone Region Severe depletion  Dorsal Hand Unable to assess  [mittens]  Patellar Region Severe depletion  Anterior Thigh Region Severe depletion  Posterior Calf Region Severe depletion  Edema (RD Assessment) None  Hair Reviewed  Eyes Reviewed  Mouth Reviewed  [missing teeth]  Skin Reviewed  Nails Reviewed   Diet Order:   Diet Order             Diet NPO time specified  Diet effective now                   EDUCATION NEEDS:  Not appropriate for education at  this time  Skin:  Skin Assessment: Reviewed RN Assessment  Last BM:  unsure  Height:  Ht Readings from Last 1 Encounters:  10/09/23 5' (1.524 m)    Weight:  Wt Readings from Last 1 Encounters:  10/11/23 48.5 kg    Ideal Body Weight:  45.5 kg  BMI:  Body mass index is 20.88 kg/m.  Estimated Nutritional Needs:  Kcal:  1300-1500 kcal/d Protein:  65-80 g/d Fluid:  1.5L/d    Vernell Lukes, RD, LDN Registered Dietitian II Please reach out via  secure chat Weekend on-call pager # available in [facility]

## 2023-10-12 DIAGNOSIS — J189 Pneumonia, unspecified organism: Secondary | ICD-10-CM | POA: Diagnosis not present

## 2023-10-12 DIAGNOSIS — E43 Unspecified severe protein-calorie malnutrition: Secondary | ICD-10-CM | POA: Insufficient documentation

## 2023-10-12 LAB — BASIC METABOLIC PANEL
Anion gap: 12 (ref 5–15)
BUN: 27 mg/dL — ABNORMAL HIGH (ref 8–23)
CO2: 29 mmol/L (ref 22–32)
Calcium: 10 mg/dL (ref 8.9–10.3)
Chloride: 98 mmol/L (ref 98–111)
Creatinine, Ser: 0.68 mg/dL (ref 0.44–1.00)
GFR, Estimated: >60 mL/min (ref >60)
Glucose, Bld: 96 mg/dL (ref 70–99)
Potassium: 4.3 mmol/L (ref 3.5–5.1)
Sodium: 139 mmol/L (ref 135–145)

## 2023-10-12 LAB — LEGIONELLA PNEUMOPHILA SEROGP 1 UR AG: L. pneumophila Serogp 1 Ur Ag: NEGATIVE

## 2023-10-12 LAB — RETICULOCYTES
Immature Retic Fract: 19.3 % — ABNORMAL HIGH (ref 2.3–15.9)
RBC.: 3.39 MIL/uL — ABNORMAL LOW (ref 3.87–5.11)
Retic Count, Absolute: 42 10*3/uL (ref 19.0–186.0)
Retic Ct Pct: 1.2 % (ref 0.4–3.1)

## 2023-10-12 LAB — CBC
HCT: 30.4 % — ABNORMAL LOW (ref 36.0–46.0)
Hemoglobin: 9.4 g/dL — ABNORMAL LOW (ref 12.0–15.0)
MCH: 28.1 pg (ref 26.0–34.0)
MCHC: 30.9 g/dL (ref 30.0–36.0)
MCV: 91 fL (ref 80.0–100.0)
Platelets: 353 10*3/uL (ref 150–400)
RBC: 3.34 MIL/uL — ABNORMAL LOW (ref 3.87–5.11)
RDW: 14 % (ref 11.5–15.5)
WBC: 17.3 10*3/uL — ABNORMAL HIGH (ref 4.0–10.5)
nRBC: 0.6 % — ABNORMAL HIGH (ref 0.0–0.2)

## 2023-10-12 LAB — IRON AND TIBC
Iron: 21 ug/dL — ABNORMAL LOW (ref 28–170)
Saturation Ratios: 9 % — ABNORMAL LOW (ref 10.4–31.8)
TIBC: 231 ug/dL — ABNORMAL LOW (ref 250–450)
UIBC: 210 ug/dL

## 2023-10-12 LAB — FOLATE: Folate: 13.6 ng/mL (ref >5.9)

## 2023-10-12 LAB — GLUCOSE, CAPILLARY
Glucose-Capillary: 102 mg/dL — ABNORMAL HIGH (ref 70–99)
Glucose-Capillary: 105 mg/dL — ABNORMAL HIGH (ref 70–99)
Glucose-Capillary: 149 mg/dL — ABNORMAL HIGH (ref 70–99)
Glucose-Capillary: 166 mg/dL — ABNORMAL HIGH (ref 70–99)
Glucose-Capillary: 169 mg/dL — ABNORMAL HIGH (ref 70–99)
Glucose-Capillary: 94 mg/dL (ref 70–99)

## 2023-10-12 LAB — PHOSPHORUS
Phosphorus: 3.3 mg/dL (ref 2.5–4.6)
Phosphorus: 3.6 mg/dL (ref 2.5–4.6)

## 2023-10-12 LAB — FERRITIN: Ferritin: 162 ng/mL (ref 11–307)

## 2023-10-12 LAB — VITAMIN B12: Vitamin B-12: 1493 pg/mL — ABNORMAL HIGH (ref 180–914)

## 2023-10-12 LAB — MAGNESIUM: Magnesium: 2.2 mg/dL (ref 1.7–2.4)

## 2023-10-12 MED ORDER — METHYLPREDNISOLONE SODIUM SUCC 125 MG IJ SOLR: 60.0000 mg | Freq: Two times a day (BID) | INTRAMUSCULAR | Status: DC

## 2023-10-12 MED ORDER — METHYLPREDNISOLONE SODIUM SUCC 125 MG IJ SOLR: 60.0000 mg | INTRAMUSCULAR | Status: DC

## 2023-10-12 MED ORDER — METHYLPREDNISOLONE SODIUM SUCC 40 MG IJ SOLR
40.0000 mg | Freq: Every day | INTRAMUSCULAR | Status: DC
  Administered 2023-10-13 – 2023-10-20 (×8): 40 mg via INTRAVENOUS
  Filled 2023-10-12 (×8): qty 1

## 2023-10-12 NOTE — Progress Notes (Signed)
 Spoke with son Warren on the phone, provided clinical updates. Shared my concerns regarding her underlying COPD and current critical illness course. We discussed involving palliative care to aid in decision making should we not be able to make vent progress in the coming days, which Warren is agreeable to. If we are able to make vent progress, would still appreciate palliative's involvement in helping to further determine goals of care with the patient.    Ronnald Gave MSN, AGACNP-BC [facility] 10/12/2023, 1:20 PM

## 2023-10-12 NOTE — Progress Notes (Signed)
 NAME:  Carla Rivera, MRN:  [id number], DOB:  May 29, 1956, LOS: 2 ADMISSION DATE:  10/09/2023, CONSULTATION DATE:  1/7 REFERRING MD:  Dr. 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 Theotis.  Her home regimen is composed of Symbicort  and scheduled DuoNebs.  She was in her usual state of health until approximately 1/4 when her baseline dyspnea worsened gradually causing her to present to med [facility] on 1/6 with these complaints.  She has been around grandchildren who have had cold symptoms recently.  Upon arrival to the med center her O2 sats were in the high 70s which did improve when oxygen  was increased to 6 L to 95%.  There was concern for COPD exacerbation and she was treated with the usual modalities including steroids, nebulized bronchodilators, magnesium , and antibiotics.  Chest radiograph concerning for pneumonia.  Despite the aforementioned treatment her condition continued to worsen as did her oxygen  requirement as well.  Ultimately she required BiPAP which she was unfortunately able to tolerate.  She was transferred ED to ED to [facility] where PCCM was asked to admit to ICU.  Pertinent  Medical History   has a past medical history of Arthritis, COPD (chronic obstructive pulmonary disease) (HCC), Depression, and HTN (hypertension).   Significant Hospital Events: Including procedures, antibiotic start and stop dates in addition to other pertinent events   1/7 Admitted with resp failure, intubated, PNA, shock 1/9 failed wean   Interim History / Subjective:  Off NE  Was off sedation this morning and desat into low 70s, associated tachy to 130s    Objective   Blood pressure 99/63, pulse 94, temperature 100.2 F (37.9 C), resp. rate (!) 26, height 5' (1.524 m), weight 50.9 kg, SpO2  93%.    Vent Mode: PRVC FiO2 (%):  [30 %-40 %] 40 % Set Rate:  [18 bmp] 18 bmp Vt Set:  [380 mL] 380 mL PEEP:  [5 cmH20] 5 cmH20 Plateau Pressure:  [18 cmH20-28 cmH20] 27 cmH20   Intake/Output Summary (Last 24 hours) at 10/12/2023 1031 Last data filed at 10/12/2023 0700 Gross per 24 hour  Intake 899.48 ml  Output 420 ml  Net 479.48 ml   Filed Weights   10/10/23 0500 10/11/23 0243 10/12/23 0500  Weight: 45.8 kg 48.5 kg 50.9 kg    Examination: General: Chronically and critically ill appearing thin frail elderly F intubated, in distress  HEENT: NCAT ETT secure. Nasal flaring.  Neuro: Awake not following commands, moving BUE BLE  CV: tachycardic  PULM:  Mechanically ventilated. Minimal air movement. Accessory muscle use  GI: thin soft  Extremities: no acute joint deformity  Skin: c/d/w   Resolved Hospital Problem list   Shock   Assessment & Plan:   AoC hypoxic and hypercarbic resp failure AECOPD Strep pneumo CAP + Rhinovirus, Coronavirus OC43 positive  P:  - cont MV support wean as able - triple therapy -solumedrol -- increasing 1/9 to 60 BID  -cont hypertonic nebs, CPT, mucinex   -azithro rocephin    Hx HTN P -holding home meds, soft pressures here   Anemia, chronic +/- dilution +/- iatrogenic losses  -follow CBC PRN   Severe protein calorie malnutrition  - EN per RD  GOC -end stage COPD admitted w AECOPD. Not progressing toward extubation candidacy at present -GOC w family  vs palliative consult   Best Practice (right click and Reselect all SmartList Selections daily)   Diet/type: NPO DVT prophylaxis prophylactic heparin   Pressure ulcer(s): N/A GI prophylaxis: PPI Lines: N/A Foley:  Yes, and it is no longer needed and removal ordered  Code Status:  full code Last date of multidisciplinary goals of care discussion [ See ED note]   Labs   CBC: Recent Labs  Lab 10/09/23 1722 10/10/23 0241 10/10/23 0258 10/10/23 0308 10/10/23 0503 10/10/23 0519  10/11/23 0533 10/12/23 0449  WBC 13.2*  --  11.0*  --  11.4*  --  17.7* 17.3*  NEUTROABS 10.9*  --  10.3*  --   --   --   --   --   HGB 11.7*   < > 9.3* 9.9* 9.4* 10.2* 8.5* 9.4*  HCT 38.9   < > 31.3* 29.0* 31.6* 30.0* 27.4* 30.4*  MCV 94.9  --  96.9  --  95.5  --  92.6 91.0  PLT 422*  --  336  --  376  --  306 353   < > = values in this interval not displayed.    Basic Metabolic Panel: Recent Labs  Lab 10/09/23 1722 10/10/23 0241 10/10/23 0258 10/10/23 0308 10/10/23 0503 10/10/23 0519 10/11/23 0533 10/11/23 0854 10/11/23 1925 10/12/23 0449  NA 142   < > 142 140 143 140 138  --   --  139  K 4.0   < > 4.8 4.7 4.7 4.1 4.0  --   --  4.3  CL 90*  --  100  --  98  --  95*  --   --  98  CO2 40*  --  28  --  30  --  30  --   --  29  GLUCOSE 129*  --  142*  --  164*  --  89  --   --  96  BUN 23  --  19  --  19  --  28*  --   --  27*  CREATININE 0.67  --  0.65  --  0.73  --  0.77  --   --  0.68  CALCIUM 10.9*  --  9.3  --  9.7  --  9.3  --   --  10.0  MG  --   --   --   --  2.7*  --   --   --   --  2.2  PHOS  --   --   --   --  2.2*  --  4.0 4.0 3.9 3.3   < > = values in this interval not displayed.   GFR: Estimated Creatinine Clearance: 48.3 mL/min (by C-G formula based on SCr of 0.68 mg/dL). Recent Labs  Lab 10/09/23 1721 10/09/23 1722 10/10/23 0258 10/10/23 0503 10/11/23 0533 10/12/23 0449  WBC  --    < > 11.0* 11.4* 17.7* 17.3*  LATICACIDVEN 1.6  --   --   --   --   --    < > = values in this interval not displayed.    Liver Function Tests: Recent Labs  Lab 10/10/23 0258  AST 23  ALT 14  ALKPHOS 78  BILITOT 0.7  PROT 6.1*  ALBUMIN 2.2*   No results for input(s): LIPASE, AMYLASE in the last 168 hours. No results for input(s): AMMONIA in the last 168 hours.  ABG    Component Value Date/Time   PHART 7.442 10/10/2023 0519  PCO2ART 48.9 (H) 10/10/2023 0519   PO2ART 104 10/10/2023 0519   HCO3 33.0 (H) 10/10/2023 0519   TCO2 34 (H) 10/10/2023 0519    O2SAT 98 10/10/2023 0519     Coagulation Profile: No results for input(s): INR, PROTIME in the last 168 hours.  Cardiac Enzymes: No results for input(s): CKTOTAL, CKMB, CKMBINDEX, TROPONINI in the last 168 hours.  HbA1C: Hgb A1c MFr Bld  Date/Time Value Ref Range Status  10/10/2023 05:03 AM 5.8 (H) 4.8 - 5.6 % Final    Comment:    (NOTE)         Prediabetes: 5.7 - 6.4         Diabetes: >6.4         Glycemic control for adults with diabetes: <7.0   12/16/2022 11:56 AM 5.9 4.6 - 6.5 % Final    Comment:    Glycemic Control Guidelines for People with Diabetes:Non Diabetic:  <6%Goal of Therapy: <7%Additional Action Suggested:  >8%     CBG: Recent Labs  Lab 10/11/23 1555 10/11/23 1944 10/12/23 0008 10/12/23 0358 10/12/23 0741  GLUCAP 117* 102* 105* 94 102*   Allergies Allergies  Allergen Reactions   Penicillins Hives, Rash and Other (See Comments)     CRITICAL CARE Performed by: Ronnald FORBES Gave   Total critical care time: 38 minutes  Critical care time was exclusive of separately billable procedures and treating other patients. Critical care was necessary to treat or prevent imminent or life-threatening deterioration.  Critical care was time spent personally by me on the following activities: development of treatment plan with patient and/or surrogate as well as nursing, discussions with consultants, evaluation of patient's response to treatment, examination of patient, obtaining history from patient or surrogate, ordering and performing treatments and interventions, ordering and review of laboratory studies, ordering and review of radiographic studies, pulse oximetry and re-evaluation of patient's condition.  Ronnald Gave MSN, AGACNP-BC [facility] Amion for pager  10/12/2023, 10:31 AM

## 2023-10-13 ENCOUNTER — Encounter (HOSPITAL_COMMUNITY): Payer: Self-pay | Admitting: Critical Care Medicine

## 2023-10-13 DIAGNOSIS — J189 Pneumonia, unspecified organism: Secondary | ICD-10-CM | POA: Diagnosis not present

## 2023-10-13 DIAGNOSIS — J449 Chronic obstructive pulmonary disease, unspecified: Secondary | ICD-10-CM

## 2023-10-13 DIAGNOSIS — J13 Pneumonia due to Streptococcus pneumoniae: Secondary | ICD-10-CM

## 2023-10-13 LAB — CBC
HCT: 30.7 % — ABNORMAL LOW (ref 36.0–46.0)
Hemoglobin: 9.2 g/dL — ABNORMAL LOW (ref 12.0–15.0)
MCH: 28.1 pg (ref 26.0–34.0)
MCHC: 30 g/dL (ref 30.0–36.0)
MCV: 93.9 fL (ref 80.0–100.0)
Platelets: 319 10*3/uL (ref 150–400)
RBC: 3.27 MIL/uL — ABNORMAL LOW (ref 3.87–5.11)
RDW: 14.1 % (ref 11.5–15.5)
WBC: 16.2 10*3/uL — ABNORMAL HIGH (ref 4.0–10.5)
nRBC: 1.7 % — ABNORMAL HIGH (ref 0.0–0.2)

## 2023-10-13 LAB — CULTURE, RESPIRATORY W GRAM STAIN

## 2023-10-13 LAB — BASIC METABOLIC PANEL
Anion gap: 11 (ref 5–15)
BUN: 18 mg/dL (ref 8–23)
CO2: 30 mmol/L (ref 22–32)
Calcium: 8.9 mg/dL (ref 8.9–10.3)
Chloride: 101 mmol/L (ref 98–111)
Creatinine, Ser: 0.56 mg/dL (ref 0.44–1.00)
GFR, Estimated: >60 mL/min (ref >60)
Glucose, Bld: 113 mg/dL — ABNORMAL HIGH (ref 70–99)
Potassium: 4.4 mmol/L (ref 3.5–5.1)
Sodium: 142 mmol/L (ref 135–145)

## 2023-10-13 LAB — MAGNESIUM: Magnesium: 2.2 mg/dL (ref 1.7–2.4)

## 2023-10-13 LAB — GLUCOSE, CAPILLARY
Glucose-Capillary: 103 mg/dL — ABNORMAL HIGH (ref 70–99)
Glucose-Capillary: 117 mg/dL — ABNORMAL HIGH (ref 70–99)
Glucose-Capillary: 118 mg/dL — ABNORMAL HIGH (ref 70–99)
Glucose-Capillary: 142 mg/dL — ABNORMAL HIGH (ref 70–99)
Glucose-Capillary: 162 mg/dL — ABNORMAL HIGH (ref 70–99)
Glucose-Capillary: 183 mg/dL — ABNORMAL HIGH (ref 70–99)
Glucose-Capillary: 84 mg/dL (ref 70–99)

## 2023-10-13 MED ORDER — NOREPINEPHRINE 4 MG/250ML-% IV SOLN
0.0000 ug/min | INTRAVENOUS | Status: DC
  Administered 2023-10-13: 2 ug/min via INTRAVENOUS
  Administered 2023-10-14: 3 ug/min via INTRAVENOUS
  Filled 2023-10-13: qty 250

## 2023-10-13 MED ORDER — DOCUSATE SODIUM 50 MG/5ML PO LIQD
100.0000 mg | Freq: Two times a day (BID) | ORAL | Status: DC
  Administered 2023-10-13 – 2023-10-28 (×20): 100 mg
  Filled 2023-10-13 (×21): qty 10

## 2023-10-13 MED ORDER — FENTANYL CITRATE PF 50 MCG/ML IJ SOSY
25.0000 ug | PREFILLED_SYRINGE | Freq: Once | INTRAMUSCULAR | Status: AC
  Administered 2023-10-13: 25 ug via INTRAVENOUS

## 2023-10-13 MED ORDER — FENTANYL BOLUS VIA INFUSION
25.0000 ug | INTRAVENOUS | Status: DC | PRN
  Administered 2023-10-13 (×2): 25 ug via INTRAVENOUS
  Administered 2023-10-13 – 2023-10-16 (×2): 100 ug via INTRAVENOUS
  Administered 2023-10-16: 25 ug via INTRAVENOUS
  Administered 2023-10-16: 100 ug via INTRAVENOUS
  Administered 2023-10-16: 50 ug via INTRAVENOUS
  Administered 2023-10-17: 100 ug via INTRAVENOUS
  Administered 2023-10-18: 25 ug via INTRAVENOUS
  Administered 2023-10-18: 50 ug via INTRAVENOUS
  Administered 2023-10-18: 25 ug via INTRAVENOUS
  Administered 2023-10-18 – 2023-10-19 (×2): 50 ug via INTRAVENOUS
  Administered 2023-10-19: 75 ug via INTRAVENOUS
  Administered 2023-10-20: 100 ug via INTRAVENOUS
  Administered 2023-10-20: 50 ug via INTRAVENOUS
  Administered 2023-10-20: 100 ug via INTRAVENOUS
  Administered 2023-10-20: 50 ug via INTRAVENOUS
  Administered 2023-10-23: 100 ug via INTRAVENOUS
  Administered 2023-10-24 – 2023-10-26 (×6): 50 ug via INTRAVENOUS
  Administered 2023-10-26: 25 ug via INTRAVENOUS
  Administered 2023-10-26 – 2023-10-27 (×3): 50 ug via INTRAVENOUS
  Administered 2023-10-27: 25 ug via INTRAVENOUS
  Administered 2023-10-27 – 2023-10-28 (×3): 50 ug via INTRAVENOUS
  Administered 2023-10-28: 25 ug via INTRAVENOUS
  Administered 2023-10-29 (×2): 50 ug via INTRAVENOUS

## 2023-10-13 MED ORDER — POLYETHYLENE GLYCOL 3350 17 G PO PACK: 17.0000 g | PACK | Freq: Every day | ORAL | Status: DC

## 2023-10-13 MED ORDER — SODIUM CHLORIDE 3 % IN NEBU
INHALATION_SOLUTION | RESPIRATORY_TRACT | Status: AC
  Filled 2023-10-13: qty 4

## 2023-10-13 MED ORDER — FENTANYL 2500MCG IN NS 250ML (10MCG/ML) PREMIX INFUSION
0.0000 ug/h | INTRAVENOUS | Status: DC
  Administered 2023-10-13: 25 ug/h via INTRAVENOUS
  Administered 2023-10-14: 75 ug/h via INTRAVENOUS
  Administered 2023-10-16 (×2): 50 ug/h via INTRAVENOUS
  Administered 2023-10-16: 25 ug/h via INTRAVENOUS
  Administered 2023-10-17: 75 ug/h via INTRAVENOUS
  Administered 2023-10-18: 175 ug/h via INTRAVENOUS
  Administered 2023-10-19: 200 ug/h via INTRAVENOUS
  Administered 2023-10-19: 100 ug/h via INTRAVENOUS
  Administered 2023-10-20: 50 ug/h via INTRAVENOUS
  Administered 2023-10-20: 75 ug/h via INTRAVENOUS
  Administered 2023-10-22: 25 ug/h via INTRAVENOUS
  Administered 2023-10-23: 100 ug/h via INTRAVENOUS
  Administered 2023-10-24: 75 ug/h via INTRAVENOUS
  Administered 2023-10-25: 50 ug/h via INTRAVENOUS
  Administered 2023-10-27: 25 ug/h via INTRAVENOUS
  Administered 2023-10-27: 75 ug/h via INTRAVENOUS
  Administered 2023-10-28: 100 ug/h via INTRAVENOUS
  Administered 2023-10-29 – 2023-10-30 (×4): 200 ug/h via INTRAVENOUS
  Filled 2023-10-13 (×21): qty 250

## 2023-10-13 NOTE — Progress Notes (Signed)
 Nutrition Follow-up  DOCUMENTATION CODES:   Severe malnutrition in context of chronic illness  INTERVENTION:   -Continue tube feeding via Cortrak: Osmolite 1.2  at 50 ml/h (1200 ml per day) Provides 1440 kcal, 67 gm protein, 984 ml free water daily  -Continue to monitor magnesium , potassium, and phosphorus BID for at least 1 more day, MD to replete as needed, as pt is at risk for refeeding syndrome given severe malnutrition. Add Thiamine  100 mg daily for 7 days   NUTRITION DIAGNOSIS:   Severe Malnutrition related to chronic illness (COPD) as evidenced by severe fat depletion, severe muscle depletion.  -ongoing, addressing with tube feeding  GOAL:   Patient will meet greater than or equal to 90% of their needs  -progressing  MONITOR:   TF tolerance, Vent status, Labs  REASON FOR ASSESSMENT:   Follow up  ASSESSMENT:   Pt with hx of COPD on home O2 and HTN presented to ED with worsening SOB. Found to have pneumonia on imaging and required intubation.  01/07-admitted to ICU, intubated 01/07-Cortrak placed, tube feeding initiated  Patient is currently intubated on ventilator support patient did not tolerate spontaneous breathing trial again, she became tachypneic with an increased work of breathing.  Temp (24hrs), Avg:99.3 F (37.4 C), Min:99 F (37.2 C), Max:99.5 F (37.5 C)  Tolerating tube feeding of Osmolite 1.2 at 50 mL/hr.  Per RN, bowel movement yesterday and overnight. Thus far potassium, magnesium  and phosphorus labs within normal range, would continue monitor another 24 hr.  Admit weight: 45.8 kg  Current weight:  Last Weight  Most recent update: 10/13/2023  6:25 AM    Weight  48 kg (105 lb 13.1 oz)              Intake/Output Summary (Last 24 hours) at 10/13/2023 1355 Last data filed at 10/13/2023 1200 Gross per 24 hour  Intake 1804.91 ml  Output 300 ml  Net 1504.91 ml   Net IO Since Admission: 5,577.1 mL [10/13/23 1355]      Nutritionally  Relevant Medications: Scheduled Meds:  arformoterol   15 mcg Nebulization BID   aspirin   81 mg Per Tube Daily   budesonide  (PULMICORT ) nebulizer solution  0.5 mg Nebulization BID   busPIRone   10 mg Per Tube BID   Chlorhexidine  Gluconate Cloth  6 each Topical Daily   guaiFENesin   15 mL Per Tube Q6H   heparin   5,000 Units Subcutaneous Q8H   insulin  aspart  0-15 Units Subcutaneous Q4H   LORazepam   0.5 mg Per Tube BID   methylPREDNISolone  (SOLU-MEDROL ) injection  40 mg Intravenous Daily   mouth rinse  15 mL Mouth Rinse Q2H   pantoprazole  (PROTONIX ) IV  40 mg Intravenous Q24H   polyethylene glycol  17 g Per Tube Daily   revefenacin   175 mcg Nebulization Daily   thiamine   100 mg Per Tube Daily   Continuous Infusions:  cefTRIAXone  (ROCEPHIN )  IV Stopped (10/13/23 1027)   dexmedetomidine  (PRECEDEX ) IV infusion 0.4 mcg/kg/hr (10/13/23 1200)   feeding supplement (OSMOLITE 1.2 CAL) 50 mL/hr at 10/13/23 1200   PRN Meds:.acetaminophen , albuterol , docusate, fentaNYL  (SUBLIMAZE ) injection, fentaNYL  (SUBLIMAZE ) injection, mouth rinse  Labs Reviewed: CBG ranges from 84-169 mg/dL over the last 24 hours        Hgb A1C 5.8%      Diet Order:   Diet Order             Diet NPO time specified  Diet effective now  EDUCATION NEEDS:   Not appropriate for education at this time  Skin:  Skin Assessment: Reviewed RN Assessment  Last BM:  10/13/2023, type 1-medium  Height:   Ht Readings from Last 1 Encounters:  10/09/23 5' (1.524 m)    Weight:   Wt Readings from Last 1 Encounters:  10/13/23 48 kg    Ideal Body Weight:  45.5 kg  BMI:  Body mass index is 20.67 kg/m.  Estimated Nutritional Needs:   Kcal:  1300-1500 kcal/d  Protein:  65-80 g/d  Fluid:  1.5L/d    Elveria Sable, RDLD Clinical Dietitian If unable to reach, please contact RD Inpatient secure chat group between 8 am-4 pm daily

## 2023-10-13 NOTE — Progress Notes (Addendum)
 1600: Patient began to show signs of respiratory distress; patient was sustaining HR over 120, RR over 20 bmp and not being compliant with the ventilator, using accessory muscles to breathe, oxygen  was in the low 80's, and was uncomfortable squirming in the bed. RT gave PRN albuterol  at 1606 and suctioned copious secretions. O2 breaths were delivered. 100 mcg of fentanyl  were given for the above symptoms at 1614 and 1651. Ronnald Gave, NP was informed of the above situation and the decision was made to initiate a continuous fentanyl  infusion.   1800: Patient required further sedation since 1600 (see MAR); BP decreased to 65/48 (55). Previous levophed  was restarted at this time. Page sent to Dr. Harold at [phone] referring to BP. Additional page sent to [facility] pager [phone]) at 1808. Dr. Claudene returned the call to this RN and the decision was made to restart Levophed . Titrated levophed  rapidly and precedex  was halved at 1815 in an attempt to stabilize BP.  BP WDL at 1818.   10/13/2023 Charletta Bathe, RN, BSN 6:52 PM

## 2023-10-13 NOTE — Progress Notes (Signed)
 NAME:  Carla Rivera, MRN:  [id number], DOB:  [birth year], LOS: 3 ADMISSION DATE:  10/09/2023, CONSULTATION DATE:  1/7 REFERRING MD:  Dr. 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 Theotis.  Her home regimen is composed of Symbicort  and scheduled DuoNebs.  She was in her usual state of health until approximately 1/4 when her baseline dyspnea worsened gradually causing her to present to med [facility] on 1/6 with these complaints.  She has been around grandchildren who have had cold symptoms recently.  Upon arrival to the med center her O2 sats were in the high 70s which did improve when oxygen  was increased to 6 L to 95%.  There was concern for COPD exacerbation and she was treated with the usual modalities including steroids, nebulized bronchodilators, magnesium , and antibiotics.  Chest radiograph concerning for pneumonia.  Despite the aforementioned treatment her condition continued to worsen as did her oxygen  requirement as well.  Ultimately she required BiPAP which she was unfortunately able to tolerate.  She was transferred ED to ED to [facility] where PCCM was asked to admit to ICU.  Pertinent  Medical History   has a past medical history of Arthritis, COPD (chronic obstructive pulmonary disease) (HCC), Depression, and HTN (hypertension).   Significant Hospital Events: Including procedures, antibiotic start and stop dates in addition to other pertinent events   1/7 Admitted with resp failure, intubated, PNA, shock 1/9 failed wean completed 3d azithro  1/10 failed wean again   Interim History / Subjective:  Awake and writing this morning   Objective   Blood pressure (!) 151/79, pulse (!) 106, temperature 99.4 F (37.4 C), temperature source Axillary, resp. rate 18, height 5' (1.524 m),  weight 48 kg, SpO2 99%.    Vent Mode: PRVC FiO2 (%):  [40 %] 40 % Set Rate:  [18 bmp] 18 bmp Vt Set:  [380 mL] 380 mL PEEP:  [5 cmH20] 5 cmH20 Plateau Pressure:  [18 cmH20-27 cmH20] 20 cmH20   Intake/Output Summary (Last 24 hours) at 10/13/2023 1047 Last data filed at 10/13/2023 0800 Gross per 24 hour  Intake 1651.17 ml  Output 220 ml  Net 1431.17 ml   Filed Weights   10/11/23 0243 10/12/23 0500 10/13/23 0500  Weight: 48.5 kg 50.9 kg 48 kg    Examination: General: Frail appearing older adult F, who appears older than stated age, intubated  HEENT: NCAT  ETT secure Anicteric sclera  Neuro: Awake following commands writing sentences  CV: rrr  PULM:  Mechanically ventilated. Nasal flaring and accessory muscle use on both PSV and Full support. Minimal air movement  GI: soft ndnt  Extremities: no acute joint deformity  Skin: c/d/w   Resolved Hospital Problem list   Shock   Assessment & Plan:    AoC hypoxic and hypercarbic resp failure AECOPD, underlying end stage COPD Pneumococcal PNA + Rhinovirus and coronavirus OC43 infections  P:  -completed 3d azithro -cont 5d rocephin  -cont solumedrol (dose with weight as consideration)  -triple therapy  -Failing SBTs (worse accessory use-- she actually has nasal flaring on full support-- , low Vt high RR), worry that she may be difficult to progress to extubation  -palliative care consult is pending   Hx HTN  P -holding home meds -- recently w some soft pressures but may  add some   Anemia-- acute on chronic  -follow CBC PRN   Severe protein calorie malnutrition  - EN per RD  GOC -Palliative care consult is placed to navigate GOC. End stage COPD without much progress toward vent liberation.   Best Practice (right click and Reselect all SmartList Selections daily)   Diet/type: NPO DVT prophylaxis prophylactic heparin   Pressure ulcer(s): N/A GI prophylaxis: PPI Lines: N/A Foley:  Yes, and it is no longer needed and  removal ordered  Code Status:  full code Last date of multidisciplinary goals of care discussion [ See ED note]   Labs   CBC: Recent Labs  Lab 10/09/23 1722 10/10/23 0241 10/10/23 0258 10/10/23 0308 10/10/23 0503 10/10/23 0519 10/11/23 0533 10/12/23 0449 10/13/23 0546  WBC 13.2*  --  11.0*  --  11.4*  --  17.7* 17.3* 16.2*  NEUTROABS 10.9*  --  10.3*  --   --   --   --   --   --   HGB 11.7*   < > 9.3*   < > 9.4* 10.2* 8.5* 9.4* 9.2*  HCT 38.9   < > 31.3*   < > 31.6* 30.0* 27.4* 30.4* 30.7*  MCV 94.9  --  96.9  --  95.5  --  92.6 91.0 93.9  PLT 422*  --  336  --  376  --  306 353 319   < > = values in this interval not displayed.    Basic Metabolic Panel: Recent Labs  Lab 10/10/23 0258 10/10/23 0308 10/10/23 0503 10/10/23 9480 10/11/23 0533 10/11/23 9145 10/11/23 1925 10/12/23 0449 10/12/23 1634 10/13/23 0546  NA 142   < > 143 140 138  --   --  139  --  142  K 4.8   < > 4.7 4.1 4.0  --   --  4.3  --  4.4  CL 100  --  98  --  95*  --   --  98  --  101  CO2 28  --  30  --  30  --   --  29  --  30  GLUCOSE 142*  --  164*  --  89  --   --  96  --  113*  BUN 19  --  19  --  28*  --   --  27*  --  18  CREATININE 0.65  --  0.73  --  0.77  --   --  0.68  --  0.56  CALCIUM 9.3  --  9.7  --  9.3  --   --  10.0  --  8.9  MG  --   --  2.7*  --   --   --   --  2.2  --  2.2  PHOS  --    < > 2.2*  --  4.0 4.0 3.9 3.3 3.6  --    < > = values in this interval not displayed.   GFR: Estimated Creatinine Clearance: 48.3 mL/min (by C-G formula based on SCr of 0.56 mg/dL). Recent Labs  Lab 10/09/23 1721 10/09/23 1722 10/10/23 0503 10/11/23 0533 10/12/23 0449 10/13/23 0546  WBC  --    < > 11.4* 17.7* 17.3* 16.2*  LATICACIDVEN 1.6  --   --   --   --   --    < > = values in this interval not displayed.    Liver Function Tests: Recent Labs  Lab 10/10/23 0258  AST 23  ALT 14  ALKPHOS 78  BILITOT 0.7  PROT 6.1*  ALBUMIN 2.2*   No results for input(s): LIPASE,  AMYLASE in the last 168 hours. No results for input(s): AMMONIA in the last 168 hours.  ABG    Component Value Date/Time   PHART 7.442 10/10/2023 0519   PCO2ART 48.9 (H) 10/10/2023 0519   PO2ART 104 10/10/2023 0519   HCO3 33.0 (H) 10/10/2023 0519   TCO2 34 (H) 10/10/2023 0519   O2SAT 98 10/10/2023 0519     Coagulation Profile: No results for input(s): INR, PROTIME in the last 168 hours.  Cardiac Enzymes: No results for input(s): CKTOTAL, CKMB, CKMBINDEX, TROPONINI in the last 168 hours.  HbA1C: Hgb A1c MFr Bld  Date/Time Value Ref Range Status  10/10/2023 05:03 AM 5.8 (H) 4.8 - 5.6 % Final    Comment:    (NOTE)         Prediabetes: 5.7 - 6.4         Diabetes: >6.4         Glycemic control for adults with diabetes: <7.0   12/16/2022 11:56 AM 5.9 4.6 - 6.5 % Final    Comment:    Glycemic Control Guidelines for People with Diabetes:Non Diabetic:  <6%Goal of Therapy: <7%Additional Action Suggested:  >8%     CBG: Recent Labs  Lab 10/12/23 1556 10/12/23 2005 10/12/23 2358 10/13/23 0347 10/13/23 0734  GLUCAP 166* 169* 142* 84 103*   Allergies Allergies  Allergen Reactions   Penicillins Hives, Rash and Other (See Comments)     CRITICAL CARE Performed by: Ronnald FORBES Gave   Total critical care time: 38 minutes  Critical care time was exclusive of separately billable procedures and treating other patients. Critical care was necessary to treat or prevent imminent or life-threatening deterioration.  Critical care was time spent personally by me on the following activities: development of treatment plan with patient and/or surrogate as well as nursing, discussions with consultants, evaluation of patient's response to treatment, examination of patient, obtaining history from patient or surrogate, ordering and performing treatments and interventions, ordering and review of laboratory studies, ordering and review of radiographic studies, pulse oximetry and  re-evaluation of patient's condition.  Ronnald Gave MSN, AGACNP-BC [facility] Amion for pager  10/13/2023, 10:47 AM

## 2023-10-13 NOTE — IPAL (Addendum)
  Interdisciplinary Goals of Care Family Meeting   Date carried out: 10/13/2023  Location of the meeting: Bedside  Member's involved: Nurse Practitioner, Family Member or next of kin, and Other: NP student   Durable Power of Attorney or acting medical decision maker: Son Carla Rivera     Discussion: We discussed goals of care for Carla Rivera .  Met with Son Carla Rivera and his wife at bedside. Discussed clinical status, concerns regarding lack of progress in context of underlying severe COPD. We started discussing goals of care-- at multiple points patient indicated wanting to be comfortable, family wants to discuss continuing a time trial. It sounds like a trajectory with trach is not congruent with her wishes. We talked about what a comfort-care trajectory would look like.  We also talked about code status.  Encouraged family to take some time to discuss-- does not need to be decided today.  Placed palliative care consult yesterday, anticipate they will see pt in the coming day(s) if not today. Appreciate their help.   Code status:   Code Status: Full Code   Disposition: Continue current acute care  Time spent for the meeting: addl CCT 38 min     Carla Rivera Gave, NP  10/13/2023, 12:18 PM

## 2023-10-14 DIAGNOSIS — J9621 Acute and chronic respiratory failure with hypoxia: Secondary | ICD-10-CM | POA: Diagnosis not present

## 2023-10-14 DIAGNOSIS — J9622 Acute and chronic respiratory failure with hypercapnia: Secondary | ICD-10-CM | POA: Diagnosis not present

## 2023-10-14 DIAGNOSIS — J13 Pneumonia due to Streptococcus pneumoniae: Principal | ICD-10-CM

## 2023-10-14 DIAGNOSIS — Z7189 Other specified counseling: Secondary | ICD-10-CM

## 2023-10-14 LAB — BASIC METABOLIC PANEL
Anion gap: 11 (ref 5–15)
BUN: 16 mg/dL (ref 8–23)
CO2: 29 mmol/L (ref 22–32)
Calcium: 9 mg/dL (ref 8.9–10.3)
Chloride: 100 mmol/L (ref 98–111)
Creatinine, Ser: 0.57 mg/dL (ref 0.44–1.00)
GFR, Estimated: >60 mL/min (ref >60)
Glucose, Bld: 127 mg/dL — ABNORMAL HIGH (ref 70–99)
Potassium: 4.6 mmol/L (ref 3.5–5.1)
Sodium: 140 mmol/L (ref 135–145)

## 2023-10-14 LAB — GLUCOSE, CAPILLARY
Glucose-Capillary: 106 mg/dL — ABNORMAL HIGH (ref 70–99)
Glucose-Capillary: 113 mg/dL — ABNORMAL HIGH (ref 70–99)
Glucose-Capillary: 124 mg/dL — ABNORMAL HIGH (ref 70–99)
Glucose-Capillary: 126 mg/dL — ABNORMAL HIGH (ref 70–99)
Glucose-Capillary: 131 mg/dL — ABNORMAL HIGH (ref 70–99)
Glucose-Capillary: 93 mg/dL (ref 70–99)

## 2023-10-14 LAB — CULTURE, BLOOD (ROUTINE X 2)
Culture: NO GROWTH
Special Requests: ADEQUATE

## 2023-10-14 MED ORDER — CHLORHEXIDINE GLUCONATE CLOTH 2 % EX PADS
6.0000 | MEDICATED_PAD | Freq: Every day | CUTANEOUS | Status: DC
  Administered 2023-10-14 – 2023-10-30 (×16): 6 via TOPICAL

## 2023-10-14 MED ORDER — SODIUM CHLORIDE 3 % IN NEBU
INHALATION_SOLUTION | RESPIRATORY_TRACT | Status: AC
  Administered 2023-10-14: 4 mL
  Filled 2023-10-14: qty 4

## 2023-10-14 MED ORDER — BETHANECHOL CHLORIDE 10 MG PO TABS
10.0000 mg | ORAL_TABLET | Freq: Three times a day (TID) | ORAL | Status: DC
  Administered 2023-10-14 – 2023-10-22 (×27): 10 mg
  Filled 2023-10-14 (×27): qty 1

## 2023-10-14 NOTE — Consult Note (Signed)
 Consultation Note Date: 10/14/2023   Patient Name: Carla Rivera  DOB: 13-Jun-1956  MRN: [id number]  Age / Sex: 68 y.o., female  PCP: Wendolyn Jenkins Jansky, MD Referring Physician: Mannam, Praveen, MD  Reason for Consultation:  end stage COPD  HPI/Patient Profile: 68 y.o. female  with past medical history of COPD, arthritis, depression, HTN admitted on 10/09/2023 with COPD exacerbation in the setting of pneumococcal pneumonia, rhinovirus and coronavirus infections. She is intubated and is failing SBTs. Had respiratory distress yesterday 1/12 on full vent support, requiring increase in sedation that resulted in hypotension requiring norepinephrine . Palliative medicine consulted for goals of care.    Primary Decision Maker NEXT OF KIN listed contact is son- Carla Rivera  Discussion: Chart reviewed including labs, progress notes, imaging from this and previous encounters. Discussed with RN and PCCM NP.  On evaluation patient awake, on continuous fentanyl  infusion 75mcg/hr. Norepinephrine  has been weaned. Per nursing she is much more comfortable today.  Patient responds slowly to questions with appropriate nods. Unable to assess her cognition and ability to participate in in depth goals of care discussion.  No family at bedside. She does not have any advanced care planning documents on chart.  PCCM discussed goals of care with family yesterday- during meeting their note indicates patient's preference was for comfort and would not want tracheostomy, but family wished for timed trial. Family is considering change in code status.     SUMMARY OF RECOMMENDATIONS -Continue current plan of care- attempted to call patient's son for further discussion but no answer and voicemail was full -Agree with recommendation for DNR status  understanding evidenced based poor outcomes in similar hospitalized patients, as the cause of the  arrest is likely associated with chronic/terminal disease rather than a reversible acute cardio-pulmonary event.  -If patient continues to fail SBT's and would not want tracheostomy- agree that extubation to comfort measures would be appropriate -PMT will continue efforts to contact family for further goals of care discussion       Code Status/Advance Care Planning: DNR   Prognosis:   Unable to determine  Discharge Planning: To Be Determined  Primary Diagnoses: Present on Admission:  Community acquired pneumonia  Acute on chronic respiratory failure (HCC)   Review of Systems  Unable to perform ROS: Intubated    Physical Exam Vitals and nursing note reviewed.  Constitutional:      General: She is not in acute distress.    Appearance: She is ill-appearing.  Pulmonary:     Comments: intubated Neurological:     Mental Status: She is alert.     Vital Signs: BP 131/81   Pulse 91   Temp 98.4 F (36.9 C) (Axillary)   Resp 17   Ht 5' (1.524 m)   Wt 50.3 kg   SpO2 100%   BMI 21.66 kg/m  Pain Scale: CPOT   Pain Score: 0-No pain   SpO2: SpO2: 100 % O2 Device:SpO2: 100 % O2 Flow Rate: .O2 Flow Rate (L/min): 8 L/min  IO: Intake/output summary:  Intake/Output Summary (Last 24 hours) at 10/14/2023 1353 Last data filed at 10/14/2023 1100 Gross per 24 hour  Intake 1611.93 ml  Output 450 ml  Net 1161.93 ml    LBM: Last BM Date : 10/12/23 Baseline Weight: Weight: 49 kg Most recent weight: Weight: 50.3 kg       Thank you for this consult. Palliative medicine will continue to follow and assist as needed.   Signed by: Cassondra Stain, AGNP-C Palliative Medicine  Time includes:   Preparing to see the patient (e.g., review of tests) Obtaining and/or reviewing separately obtained history Performing a medically necessary appropriate examination and/or evaluation Counseling and educating the patient/family/caregiver Ordering medications, tests, or procedures Referring  and communicating with other health care professionals (when not reported separately) Documenting clinical information in the electronic or other health record Independently interpreting results (not reported separately) and communicating results to the patient/family/caregiver Care coordination (not reported separately) Clinical documentation   Please contact Palliative Medicine Team phone at [phone] for questions and concerns.  For individual provider: See Tracey

## 2023-10-14 NOTE — Progress Notes (Addendum)
 NAME:  Carla Rivera, MRN:  [id number], DOB:  [birth year], LOS: 4 ADMISSION DATE:  10/09/2023, CONSULTATION DATE:  1/7 REFERRING MD:  Dr. 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 Theotis.  Her home regimen is composed of Symbicort  and scheduled DuoNebs.  She was in her usual state of health until approximately 1/4 when her baseline dyspnea worsened gradually causing her to present to med [facility] on 1/6 with these complaints.  She has been around grandchildren who have had cold symptoms recently.  Upon arrival to the med center her O2 sats were in the high 70s which did improve when oxygen  was increased to 6 L to 95%.  There was concern for COPD exacerbation and she was treated with the usual modalities including steroids, nebulized bronchodilators, magnesium , and antibiotics.  Chest radiograph concerning for pneumonia.  Despite the aforementioned treatment her condition continued to worsen as did her oxygen  requirement as well.  Ultimately she required BiPAP which she was unfortunately able to tolerate.  She was transferred ED to ED to [facility] where PCCM was asked to admit to ICU.  Pertinent  Medical History   has a past medical history of Arthritis, COPD (chronic obstructive pulmonary disease) (HCC), Depression, and HTN (hypertension).   Significant Hospital Events: Including procedures, antibiotic start and stop dates in addition to other pertinent events   1/7 Admitted with resp failure, intubated, PNA, shock 1/9 failed wean completed 3d azithro  1/10 failed wean again   Interim History / Subjective:  Needed incr sedation late shift yesterday and overnight. Some associated hypotension, now off pressors again   Objective   Blood pressure 131/81, pulse 91, temperature 97.7 F (36.5  C), temperature source Axillary, resp. rate 16, height 5' (1.524 m), weight 50.3 kg, SpO2 100%.    Vent Mode: PRVC FiO2 (%):  [40 %] 40 % Set Rate:  [18 bmp] 18 bmp Vt Set:  [380 mL] 380 mL PEEP:  [5 cmH20] 5 cmH20 Plateau Pressure:  [15 cmH20-24 cmH20] 24 cmH20   Intake/Output Summary (Last 24 hours) at 10/14/2023 1159 Last data filed at 10/14/2023 1100 Gross per 24 hour  Intake 1716.78 ml  Output 650 ml  Net 1066.78 ml   Filed Weights   10/12/23 0500 10/13/23 0500 10/14/23 0343  Weight: 50.9 kg 48 kg 50.3 kg    Examination: General: Frail appearing F who appears older than stated age intubated lightly sedated  HEENT: NCAT pink mm anicteric sclera Neuro: Lightly sedate. Following commands  CV: rrr s1s2  PULM:  Bilat wheeze with poor air movement. Mechanically ventilated  GI: soft ndnt  Extremities: no acute joint deformity  Skin: c/d/w  Resolved Hospital Problem list   Shock   Assessment & Plan:   GOC -end stage COPD, failing to make progress towards extubation & unfortunately has actually worsened in last 24 hours. Started GOC talks 1/10.   AoC hypoxic and hypercarbic resp failure AECOPD w end stage COPD, O2 dependent  Pneumococcal PNA, Rhinovirus and coronavirus OC43 infections P:  -completed 3d azithro -cont 5d rocephin  -cont solumedrol (dose with weight as consideration)  -triple therapy  -failing SBTs consistently  -palliative care consult is pending -- GOC talks are ongoing in interim   Hypotension, favor sedation related  P -periph NE MAP > 65 --  wean as able   Hx HTN  P -holding home meds in setting of above   Anemia-- acute on chronic  -follow CBC PRN   Severe protein calorie malnutrition  - EN per RD   Best Practice (right click and Reselect all SmartList Selections daily)   Diet/type: NPO DVT prophylaxis prophylactic heparin   Pressure ulcer(s): N/A GI prophylaxis: PPI Lines: N/A Foley:  N/A Code Status:  full code Last date of  multidisciplinary goals of care discussion [ 1/10   Labs   CBC: Recent Labs  Lab 10/09/23 1722 10/10/23 0241 10/10/23 0258 10/10/23 0308 10/10/23 0503 10/10/23 0519 10/11/23 0533 10/12/23 0449 10/13/23 0546  WBC 13.2*  --  11.0*  --  11.4*  --  17.7* 17.3* 16.2*  NEUTROABS 10.9*  --  10.3*  --   --   --   --   --   --   HGB 11.7*   < > 9.3*   < > 9.4* 10.2* 8.5* 9.4* 9.2*  HCT 38.9   < > 31.3*   < > 31.6* 30.0* 27.4* 30.4* 30.7*  MCV 94.9  --  96.9  --  95.5  --  92.6 91.0 93.9  PLT 422*  --  336  --  376  --  306 353 319   < > = values in this interval not displayed.    Basic Metabolic Panel: Recent Labs  Lab 10/10/23 0503 10/10/23 0519 10/11/23 0533 10/11/23 0854 10/11/23 1925 10/12/23 0449 10/12/23 1634 10/13/23 0546 10/14/23 0400  NA 143 140 138  --   --  139  --  142 140  K 4.7 4.1 4.0  --   --  4.3  --  4.4 4.6  CL 98  --  95*  --   --  98  --  101 100  CO2 30  --  30  --   --  29  --  30 29  GLUCOSE 164*  --  89  --   --  96  --  113* 127*  BUN 19  --  28*  --   --  27*  --  18 16  CREATININE 0.73  --  0.77  --   --  0.68  --  0.56 0.57  CALCIUM 9.7  --  9.3  --   --  10.0  --  8.9 9.0  MG 2.7*  --   --   --   --  2.2  --  2.2  --   PHOS 2.2*  --  4.0 4.0 3.9 3.3 3.6  --   --    GFR: Estimated Creatinine Clearance: 48.3 mL/min (by C-G formula based on SCr of 0.57 mg/dL). Recent Labs  Lab 10/09/23 1721 10/09/23 1722 10/10/23 0503 10/11/23 0533 10/12/23 0449 10/13/23 0546  WBC  --    < > 11.4* 17.7* 17.3* 16.2*  LATICACIDVEN 1.6  --   --   --   --   --    < > = values in this interval not displayed.    Liver Function Tests: Recent Labs  Lab 10/10/23 0258  AST 23  ALT 14  ALKPHOS 78  BILITOT 0.7  PROT 6.1*  ALBUMIN 2.2*   No results for input(s): LIPASE, AMYLASE in the last 168 hours. No results for input(s): AMMONIA in the last 168 hours.  ABG    Component Value Date/Time   PHART 7.442 10/10/2023 0519   PCO2ART 48.9 (H)  10/10/2023 0519  PO2ART 104 10/10/2023 0519   HCO3 33.0 (H) 10/10/2023 0519   TCO2 34 (H) 10/10/2023 0519   O2SAT 98 10/10/2023 0519     Coagulation Profile: No results for input(s): INR, PROTIME in the last 168 hours.  Cardiac Enzymes: No results for input(s): CKTOTAL, CKMB, CKMBINDEX, TROPONINI in the last 168 hours.  HbA1C: Hgb A1c MFr Bld  Date/Time Value Ref Range Status  10/10/2023 05:03 AM 5.8 (H) 4.8 - 5.6 % Final    Comment:    (NOTE)         Prediabetes: 5.7 - 6.4         Diabetes: >6.4         Glycemic control for adults with diabetes: <7.0   12/16/2022 11:56 AM 5.9 4.6 - 6.5 % Final    Comment:    Glycemic Control Guidelines for People with Diabetes:Non Diabetic:  <6%Goal of Therapy: <7%Additional Action Suggested:  >8%     CBG: Recent Labs  Lab 10/13/23 2001 10/13/23 2333 10/14/23 0339 10/14/23 0757 10/14/23 1145  GLUCAP 183* 118* 113* 106* 124*   Allergies Allergies  Allergen Reactions   Penicillins Hives, Rash and Other (See Comments)    CRITICAL CARE Performed by: Ronnald FORBES Gave   Total critical care time: 36 minutes  Critical care time was exclusive of separately billable procedures and treating other patients.  Critical care was necessary to treat or prevent imminent or life-threatening deterioration.  Critical care was time spent personally by me on the following activities: development of treatment plan with patient and/or surrogate as well as nursing, discussions with consultants, evaluation of patient's response to treatment, examination of patient, obtaining history from patient or surrogate, ordering and performing treatments and interventions, ordering and review of laboratory studies, ordering and review of radiographic studies, pulse oximetry and re-evaluation of patient's condition.  Ronnald Gave MSN, AGACNP-BC [facility] Amion for pager  10/14/2023, 11:59 AM

## 2023-10-14 NOTE — Progress Notes (Signed)
 Pt had 1 episode of incontinence around 0000 but no void since then, bladder scanned her with 220 ml, CCM notified of low output

## 2023-10-15 DIAGNOSIS — J189 Pneumonia, unspecified organism: Secondary | ICD-10-CM | POA: Diagnosis not present

## 2023-10-15 LAB — GLUCOSE, CAPILLARY
Glucose-Capillary: 129 mg/dL — ABNORMAL HIGH (ref 70–99)
Glucose-Capillary: 111 mg/dL — ABNORMAL HIGH (ref 70–99)
Glucose-Capillary: 108 mg/dL — ABNORMAL HIGH (ref 70–99)
Glucose-Capillary: 147 mg/dL — ABNORMAL HIGH (ref 70–99)
Glucose-Capillary: 145 mg/dL — ABNORMAL HIGH (ref 70–99)

## 2023-10-15 NOTE — Progress Notes (Signed)
 NAME:  Carla Rivera, MRN:  [id number], DOB:  1956-02-23, LOS: 5 ADMISSION DATE:  10/09/2023, CONSULTATION DATE:  1/7 REFERRING MD:  Dr. 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 Theotis.  Her home regimen is composed of Symbicort  and scheduled DuoNebs.  She was in her usual state of health until approximately 1/4 when her baseline dyspnea worsened gradually causing her to present to med [facility] on 1/6 with these complaints.  She has been around grandchildren who have had cold symptoms recently.  Upon arrival to the med center her O2 sats were in the high 70s which did improve when oxygen  was increased to 6 L to 95%.  There was concern for COPD exacerbation and she was treated with the usual modalities including steroids, nebulized bronchodilators, magnesium , and antibiotics.  Chest radiograph concerning for pneumonia.  Despite the aforementioned treatment her condition continued to worsen as did her oxygen  requirement as well.  Ultimately she required BiPAP which she was unfortunately able to tolerate.  She was transferred ED to ED to [facility] where PCCM was asked to admit to ICU.  Pertinent  Medical History   has a past medical history of Arthritis, COPD (chronic obstructive pulmonary disease) (HCC), Depression, and HTN (hypertension).   Significant Hospital Events: Including procedures, antibiotic start and stop dates in addition to other pertinent events   1/7 Admitted with resp failure, intubated, PNA, shock 1/9 failed wean completed 3d azithro  1/10 failed wean again  1/11 family did not answer phone. Palliative consulted.  1/12 talking w fam at bedside, they are planning to talk w palli today vs tomorrow   Interim History / Subjective:  Straight cath overnight   Objective    Blood pressure 117/73, pulse 92, temperature 98.9 F (37.2 C), temperature source Axillary, resp. rate 16, height 5' (1.524 m), weight 52.5 kg, SpO2 99%.    Vent Mode: PRVC FiO2 (%):  [40 %] 40 % Set Rate:  [18 bmp] 18 bmp Vt Set:  [380 mL] 380 mL PEEP:  [5 cmH20] 5 cmH20 Plateau Pressure:  [10 cmH20-24 cmH20] 24 cmH20   Intake/Output Summary (Last 24 hours) at 10/15/2023 1058 Last data filed at 10/15/2023 1000 Gross per 24 hour  Intake 1399.65 ml  Output 900 ml  Net 499.65 ml   Filed Weights   10/13/23 0500 10/14/23 0343 10/15/23 0434  Weight: 48 kg 50.3 kg 52.5 kg    Examination: General: chronically an d critically ill F intubated lightly sedated  HEENT: NCAT pink mm anicteric sclera ETT secure Neuro: Awake following commands, writing mostly coherently  CV: rr  PULM:  Minimal air movement. Double stacking and trapping  GI: soft  Extremities: no acute deformity  Skin: c/d/w  Resolved Hospital Problem list   Shock   Assessment & Plan:   GOC -end stage COPD, failing to make progress towards extubation & unfortunately has actually worsened in last 24 hours now requiring cont fent gtt. Started GOC talks 1/10. Family at bedside 1/12 and expresses plans to talk w palliative 1/12 vs 1/13. I have recommended DNR/I and consideration of comfort measures in this difficult situation where neither pt nor family realized the severity of underlying COPD.   AoC hypoxic and hypercarbic resp failure AECOPD w underlying end stage COPD Strep pneumo PNA Rhinovirus  and coronavirus OC43 infection  P:  -vent adjusted. Trying to balance optimal mechanics w sedation so that she can be awake and involved in these meetings -completed rocephin  and azithro  -solumedrol, triple therapy -pulm hygiene -wean as able-- degree of baseline dz burden has proved very limiting in making progress   Hx HTN  P -holding home meds -- very sensitive to sedation and at times has sedation related hypotension  requiring low dose periph pressors   AoC anemia  -follow CBC PRN   Severe protein calorie malnutrition  - EN per RD  Episodic urinary retention -some unmeasured voids  as well as 1x retention req IO  -cont bethanecol and IO per protocol    Best Practice (right click and Reselect all SmartList Selections daily)   Diet/type: NPO EN  DVT prophylaxis prophylactic heparin   Pressure ulcer(s): N/A GI prophylaxis: PPI Lines: N/A Foley:  N/A Code Status:  full code Last date of multidisciplinary goals of care discussion [ 1/12   Labs   CBC: Recent Labs  Lab 10/09/23 1722 10/10/23 0241 10/10/23 0258 10/10/23 0308 10/10/23 0503 10/10/23 0519 10/11/23 0533 10/12/23 0449 10/13/23 0546  WBC 13.2*  --  11.0*  --  11.4*  --  17.7* 17.3* 16.2*  NEUTROABS 10.9*  --  10.3*  --   --   --   --   --   --   HGB 11.7*   < > 9.3*   < > 9.4* 10.2* 8.5* 9.4* 9.2*  HCT 38.9   < > 31.3*   < > 31.6* 30.0* 27.4* 30.4* 30.7*  MCV 94.9  --  96.9  --  95.5  --  92.6 91.0 93.9  PLT 422*  --  336  --  376  --  306 353 319   < > = values in this interval not displayed.    Basic Metabolic Panel: Recent Labs  Lab 10/10/23 0503 10/10/23 0519 10/11/23 0533 10/11/23 0854 10/11/23 1925 10/12/23 0449 10/12/23 1634 10/13/23 0546 10/14/23 0400  NA 143 140 138  --   --  139  --  142 140  K 4.7 4.1 4.0  --   --  4.3  --  4.4 4.6  CL 98  --  95*  --   --  98  --  101 100  CO2 30  --  30  --   --  29  --  30 29  GLUCOSE 164*  --  89  --   --  96  --  113* 127*  BUN 19  --  28*  --   --  27*  --  18 16  CREATININE 0.73  --  0.77  --   --  0.68  --  0.56 0.57  CALCIUM 9.7  --  9.3  --   --  10.0  --  8.9 9.0  MG 2.7*  --   --   --   --  2.2  --  2.2  --   PHOS 2.2*  --  4.0 4.0 3.9 3.3 3.6  --   --    GFR: Estimated Creatinine Clearance: 48.3 mL/min (by C-G formula based on SCr of 0.57 mg/dL). Recent Labs  Lab 10/09/23 1721 10/09/23 1722 10/10/23 0503 10/11/23 0533 10/12/23 0449  10/13/23 0546  WBC  --    < > 11.4* 17.7* 17.3* 16.2*  LATICACIDVEN 1.6  --   --   --   --   --    < > =  values in this interval not displayed.    Liver Function Tests: Recent Labs  Lab 10/10/23 0258  AST 23  ALT 14  ALKPHOS 78  BILITOT 0.7  PROT 6.1*  ALBUMIN 2.2*   No results for input(s): LIPASE, AMYLASE in the last 168 hours. No results for input(s): AMMONIA in the last 168 hours.  ABG    Component Value Date/Time   PHART 7.442 10/10/2023 0519   PCO2ART 48.9 (H) 10/10/2023 0519   PO2ART 104 10/10/2023 0519   HCO3 33.0 (H) 10/10/2023 0519   TCO2 34 (H) 10/10/2023 0519   O2SAT 98 10/10/2023 0519     Coagulation Profile: No results for input(s): INR, PROTIME in the last 168 hours.  Cardiac Enzymes: No results for input(s): CKTOTAL, CKMB, CKMBINDEX, TROPONINI in the last 168 hours.  HbA1C: Hgb A1c MFr Bld  Date/Time Value Ref Range Status  10/10/2023 05:03 AM 5.8 (H) 4.8 - 5.6 % Final    Comment:    (NOTE)         Prediabetes: 5.7 - 6.4         Diabetes: >6.4         Glycemic control for adults with diabetes: <7.0   12/16/2022 11:56 AM 5.9 4.6 - 6.5 % Final    Comment:    Glycemic Control Guidelines for People with Diabetes:Non Diabetic:  <6%Goal of Therapy: <7%Additional Action Suggested:  >8%     CBG: Recent Labs  Lab 10/14/23 1145 10/14/23 1619 10/14/23 1959 10/14/23 2340 10/15/23 0404  GLUCAP 124* 131* 126* 93 129*   CRITICAL CARE Performed by: Ronnald FORBES Gave   Total critical care time: 42  minutes  Critical care time was exclusive of separately billable procedures and treating other patients.  Critical care was necessary to treat or prevent imminent or life-threatening deterioration.  Critical care was time spent personally by me on the following activities: development of treatment plan with patient and/or surrogate as well as nursing, discussions with consultants, evaluation of patient's response to treatment,  examination of patient, obtaining history from patient or surrogate, ordering and performing treatments and interventions, ordering and review of laboratory studies, ordering and review of radiographic studies, pulse oximetry and re-evaluation of patient's condition.  Ronnald Gave MSN, AGACNP-BC  Pulmonary/Critical Care Medicine Amion for pager  10/15/2023, 10:58 AM

## 2023-10-15 NOTE — Progress Notes (Signed)
 Pt did not void and was bladder scanned, noted to be 425 ml, she was  In and Out per protocol and 400 ml of clear urine was recorded

## 2023-10-16 ENCOUNTER — Inpatient Hospital Stay (HOSPITAL_COMMUNITY): Payer: Medicare PPO

## 2023-10-16 ENCOUNTER — Ambulatory Visit: Payer: Medicare PPO | Admitting: Family Medicine

## 2023-10-16 DIAGNOSIS — Z7189 Other specified counseling: Secondary | ICD-10-CM

## 2023-10-16 DIAGNOSIS — J189 Pneumonia, unspecified organism: Secondary | ICD-10-CM | POA: Diagnosis not present

## 2023-10-16 DIAGNOSIS — E43 Unspecified severe protein-calorie malnutrition: Secondary | ICD-10-CM | POA: Diagnosis not present

## 2023-10-16 DIAGNOSIS — J449 Chronic obstructive pulmonary disease, unspecified: Secondary | ICD-10-CM | POA: Diagnosis not present

## 2023-10-16 DIAGNOSIS — J9621 Acute and chronic respiratory failure with hypoxia: Secondary | ICD-10-CM | POA: Diagnosis not present

## 2023-10-16 LAB — GLUCOSE, CAPILLARY
Glucose-Capillary: 108 mg/dL — ABNORMAL HIGH (ref 70–99)
Glucose-Capillary: 107 mg/dL — ABNORMAL HIGH (ref 70–99)
Glucose-Capillary: 123 mg/dL — ABNORMAL HIGH (ref 70–99)
Glucose-Capillary: 101 mg/dL — ABNORMAL HIGH (ref 70–99)
Glucose-Capillary: 183 mg/dL — ABNORMAL HIGH (ref 70–99)
Glucose-Capillary: 138 mg/dL — ABNORMAL HIGH (ref 70–99)

## 2023-10-16 MED ORDER — ALBUTEROL SULFATE (2.5 MG/3ML) 0.083% IN NEBU
2.5000 mg | INHALATION_SOLUTION | RESPIRATORY_TRACT | Status: AC
  Administered 2023-10-16 – 2023-10-17 (×4): 2.5 mg via RESPIRATORY_TRACT
  Filled 2023-10-16: qty 3

## 2023-10-16 MED ORDER — DEXMEDETOMIDINE HCL IN NACL 400 MCG/100ML IV SOLN
0.0000 ug/kg/h | INTRAVENOUS | Status: DC
  Administered 2023-10-16: 0.7 ug/kg/h via INTRAVENOUS
  Administered 2023-10-16: 0.4 ug/kg/h via INTRAVENOUS
  Administered 2023-10-17: 0.8 ug/kg/h via INTRAVENOUS
  Administered 2023-10-17: 0.9 ug/kg/h via INTRAVENOUS
  Administered 2023-10-18: 1.1 ug/kg/h via INTRAVENOUS
  Administered 2023-10-18 – 2023-10-19 (×4): 1.2 ug/kg/h via INTRAVENOUS
  Administered 2023-10-19: 0.9 ug/kg/h via INTRAVENOUS
  Administered 2023-10-19: 0.5 ug/kg/h via INTRAVENOUS
  Administered 2023-10-20: 1.2 ug/kg/h via INTRAVENOUS
  Administered 2023-10-20: 0.5 ug/kg/h via INTRAVENOUS
  Administered 2023-10-20 – 2023-10-21 (×3): 1.2 ug/kg/h via INTRAVENOUS
  Administered 2023-10-21 – 2023-10-22 (×3): 1 ug/kg/h via INTRAVENOUS
  Administered 2023-10-22 (×3): 1.2 ug/kg/h via INTRAVENOUS
  Administered 2023-10-22: 0.998 ug/kg/h via INTRAVENOUS
  Administered 2023-10-23: 1.2 ug/kg/h via INTRAVENOUS
  Administered 2023-10-23: 0.7 ug/kg/h via INTRAVENOUS
  Administered 2023-10-23: 0.8 ug/kg/h via INTRAVENOUS
  Administered 2023-10-24 (×2): 0.7 ug/kg/h via INTRAVENOUS
  Administered 2023-10-25: 0.5 ug/kg/h via INTRAVENOUS
  Administered 2023-10-26: 0.4 ug/kg/h via INTRAVENOUS
  Administered 2023-10-27 (×2): 0.6 ug/kg/h via INTRAVENOUS
  Administered 2023-10-28 (×2): 1 ug/kg/h via INTRAVENOUS
  Administered 2023-10-28: 0.8 ug/kg/h via INTRAVENOUS
  Administered 2023-10-28 – 2023-10-29 (×3): 1.2 ug/kg/h via INTRAVENOUS
  Administered 2023-10-29: 1 ug/kg/h via INTRAVENOUS
  Administered 2023-10-30 (×3): 1.2 ug/kg/h via INTRAVENOUS
  Filled 2023-10-16 (×9): qty 100
  Filled 2023-10-16: qty 200
  Filled 2023-10-16 (×9): qty 100
  Filled 2023-10-16: qty 200
  Filled 2023-10-16 (×11): qty 100
  Filled 2023-10-16: qty 200
  Filled 2023-10-16 (×9): qty 100

## 2023-10-16 MED ORDER — SODIUM CHLORIDE 0.9 % IV SOLN: 250.0000 mL | INTRAVENOUS | Status: AC

## 2023-10-16 MED ORDER — NOREPINEPHRINE 4 MG/250ML-% IV SOLN
2.0000 ug/min | INTRAVENOUS | Status: DC
  Administered 2023-10-16: 2 ug/min via INTRAVENOUS
  Administered 2023-10-17: 5 ug/min via INTRAVENOUS
  Administered 2023-10-17: 7 ug/min via INTRAVENOUS
  Administered 2023-10-18: 8 ug/min via INTRAVENOUS
  Administered 2023-10-18: 7 ug/min via INTRAVENOUS
  Administered 2023-10-19: 2 ug/min via INTRAVENOUS
  Administered 2023-10-19: 7 ug/min via INTRAVENOUS
  Administered 2023-10-20: 4 ug/min via INTRAVENOUS
  Administered 2023-10-21: 7 ug/min via INTRAVENOUS
  Administered 2023-10-22: 6 ug/min via INTRAVENOUS
  Administered 2023-10-22: 4 ug/min via INTRAVENOUS
  Administered 2023-10-23: 3 ug/min via INTRAVENOUS
  Filled 2023-10-16 (×10): qty 250

## 2023-10-16 MED ORDER — IPRATROPIUM-ALBUTEROL 0.5-2.5 (3) MG/3ML IN SOLN
RESPIRATORY_TRACT | Status: AC
  Filled 2023-10-16: qty 3

## 2023-10-16 MED ORDER — ALBUTEROL SULFATE (2.5 MG/3ML) 0.083% IN NEBU
2.5000 mg | INHALATION_SOLUTION | Freq: Four times a day (QID) | RESPIRATORY_TRACT | Status: AC
Start: 2023-10-18 — End: 2023-10-19
  Administered 2023-10-18 (×4): 2.5 mg via RESPIRATORY_TRACT
  Filled 2023-10-16 (×4): qty 3

## 2023-10-16 MED ORDER — IPRATROPIUM-ALBUTEROL 0.5-2.5 (3) MG/3ML IN SOLN
3.0000 mL | RESPIRATORY_TRACT | Status: DC | PRN
  Administered 2023-10-16 – 2023-10-28 (×4): 3 mL via RESPIRATORY_TRACT
  Filled 2023-10-16 (×3): qty 3

## 2023-10-16 MED ORDER — ALBUTEROL SULFATE (2.5 MG/3ML) 0.083% IN NEBU
2.5000 mg | INHALATION_SOLUTION | RESPIRATORY_TRACT | Status: AC
Start: 2023-10-17 — End: 2023-10-17
  Administered 2023-10-17 (×3): 2.5 mg via RESPIRATORY_TRACT
  Filled 2023-10-16 (×4): qty 3

## 2023-10-16 MED ORDER — ALBUTEROL SULFATE (2.5 MG/3ML) 0.083% IN NEBU: 10.0000 mg | INHALATION_SOLUTION | Freq: Once | RESPIRATORY_TRACT | Status: AC

## 2023-10-16 NOTE — Plan of Care (Signed)
 Upon initial assessment patient presented alert and oriented, able to follow commands and write appropriate orientation questions. Mid afternoon patient had desaturation episode, see my previous note. Patient stabilized, fentanyl  boluses given for vent compliance. Later in the shift, patient became hypotensive, Dr. Orval notified, pressors initiated. Patient now normotensive within parameters. Patient and family updated in plan of care. ICU status maintained.   Problem: Education: Goal: Knowledge of General Education information will improve Description: Including pain rating scale, medication(s)/side effects and non-pharmacologic comfort measures Outcome: Not Progressing   Problem: Health Behavior/Discharge Planning: Goal: Ability to manage health-related needs will improve Outcome: Not Progressing   Problem: Clinical Measurements: Goal: Ability to maintain clinical measurements within normal limits will improve Outcome: Not Progressing Goal: Will remain free from infection Outcome: Not Progressing Goal: Diagnostic test results will improve Outcome: Not Progressing Goal: Respiratory complications will improve Outcome: Not Progressing Goal: Cardiovascular complication will be avoided Outcome: Not Progressing   Problem: Activity: Goal: Risk for activity intolerance will decrease Outcome: Not Progressing   Problem: Nutrition: Goal: Adequate nutrition will be maintained Outcome: Not Progressing   Problem: Coping: Goal: Level of anxiety will decrease Outcome: Not Progressing   Problem: Elimination: Goal: Will not experience complications related to bowel motility Outcome: Not Progressing Goal: Will not experience complications related to urinary retention Outcome: Not Progressing   Problem: Pain Management: Goal: General experience of comfort will improve Outcome: Not Progressing   Problem: Safety: Goal: Ability to remain free from injury will improve Outcome: Not  Progressing   Problem: Skin Integrity: Goal: Risk for impaired skin integrity will decrease Outcome: Not Progressing   Problem: Education: Goal: Ability to describe self-care measures that may prevent or decrease complications (Diabetes Survival Skills Education) will improve Outcome: Not Progressing Goal: Individualized Educational Video(s) Outcome: Not Progressing   Problem: Coping: Goal: Ability to adjust to condition or change in health will improve Outcome: Not Progressing   Problem: Fluid Volume: Goal: Ability to maintain a balanced intake and output will improve Outcome: Not Progressing   Problem: Health Behavior/Discharge Planning: Goal: Ability to identify and utilize available resources and services will improve Outcome: Not Progressing Goal: Ability to manage health-related needs will improve Outcome: Not Progressing   Problem: Metabolic: Goal: Ability to maintain appropriate glucose levels will improve Outcome: Not Progressing   Problem: Nutritional: Goal: Maintenance of adequate nutrition will improve Outcome: Not Progressing Goal: Progress toward achieving an optimal weight will improve Outcome: Not Progressing   Problem: Skin Integrity: Goal: Risk for impaired skin integrity will decrease Outcome: Not Progressing   Problem: Tissue Perfusion: Goal: Adequacy of tissue perfusion will improve Outcome: Not Progressing

## 2023-10-16 NOTE — Progress Notes (Signed)
 NAME:  Carla Rivera, MRN:  [id number], DOB:  Jun 09, 1956, LOS: 6 ADMISSION DATE:  10/09/2023, CONSULTATION DATE:  1/7 REFERRING MD:  Dr. 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 Theotis.  Her home regimen is composed of Symbicort  and scheduled DuoNebs.  She was in her usual state of health until approximately 1/4 when her baseline dyspnea worsened gradually causing her to present to med [facility] on 1/6 with these complaints.  She has been around grandchildren who have had cold symptoms recently.  Upon arrival to the med center her O2 sats were in the high 70s which did improve when oxygen  was increased to 6 L to 95%.  There was concern for COPD exacerbation and she was treated with the usual modalities including steroids, nebulized bronchodilators, magnesium , and antibiotics.  Chest radiograph concerning for pneumonia.  Despite the aforementioned treatment her condition continued to worsen as did her oxygen  requirement as well.  Ultimately she required BiPAP which she was unfortunately able to tolerate.  She was transferred ED to ED to [facility] where PCCM was asked to admit to ICU.  Pertinent  Medical History   has a past medical history of Arthritis, COPD (chronic obstructive pulmonary disease) (HCC), Depression, and HTN (hypertension).   Significant Hospital Events: Including procedures, antibiotic start and stop dates in addition to other pertinent events   1/7 Admitted with resp failure, intubated, PNA, shock 1/9 failed wean completed 3d azithro  1/10 failed wean again  1/12 Needed increased sedation late shift yesterday and overnight. Some associated hypotension, now off pressors again  1/13 no acute events overnight, remains on low-dose fentanyl , intubated  Interim History /  Subjective:  Alert and interactive on ventilator, attempting to use written communication  Objective   Blood pressure 128/77, pulse (!) 102, temperature 99.1 F (37.3 C), temperature source Axillary, resp. rate 16, height 5' (1.524 m), weight 52.5 kg, SpO2 98%.    Vent Mode: PRVC FiO2 (%):  [40 %] 40 % Set Rate:  [18 bmp] 18 bmp Vt Set:  [380 mL] 380 mL PEEP:  [5 cmH20] 5 cmH20 Plateau Pressure:  [15 cmH20-28 cmH20] 24 cmH20   Intake/Output Summary (Last 24 hours) at 10/16/2023 0751 Last data filed at 10/16/2023 0700 Gross per 24 hour  Intake 1344.53 ml  Output 750 ml  Net 594.53 ml   Filed Weights   10/13/23 0500 10/14/23 0343 10/15/23 0434  Weight: 48 kg 50.3 kg 52.5 kg    Examination: General: Acute on chronic ill-appearing deconditioned middle-aged female lying in bed on mechanical ventilation in no acute distress HEENT: ETT, MM pink/moist, PERRL,  Neuro: Alert and interactive, trying to use written communication CV: s1s2 regular rate and rhythm, no murmur, rubs, or gallops,  PULM: Slightly diminished bilaterally, faint expiratory right upper lobe wheeze, no increased work of breathing, tolerating ventilator GI: soft, bowel sounds active in all 4 quadrants, non-tender, non-distended, tolerating TF Extremities: warm/dry, no edema  Skin: no rashes or lesions  Resolved Hospital Problem list   Shock  Hypotension  Assessment & Plan:   Acute on chronic hypoxic and hypercarbic resp failure -Utilizes 2 L nasal cannula at baseline AECOPD w end stage COPD, POA  Pneumococcal PNA, Rhinovirus and coronavirus OC43 infections -Completed 3 days of azithromycin  P:  Continue ceftriaxone  x 5  days starting continue ventilator support with lung protective strategies  Wean PEEP and FiO2 for sats greater than 90% Trial SBT this a.m., may be able to extubate and allow patient to communicate wishes postextubation Head of bed elevated 30 degrees. Plateau pressures less than 30 cm H20.   Follow intermittent chest x-ray and ABG.   SAT/SBT as tolerated, mentation preclude extubation  Ensure adequate pulmonary hygiene  Follow cultures  VAP bundle in place  PAD protocol Continue scheduled triple therapy bronchodilators Continue IV steroids Ongoing goals of care discussion, see below  Essential HTN  P Hold home medications when appropriate  Anemia-- acute on chronic  P: Trend CBC Transfuse per protocol Hemoglobin goal greater than 7  Severe protein calorie malnutrition  P: Protein supplementation Continue tube feeds RD following, appreciate assistance  GOC -end stage COPD, failing to make progress towards extubation & unfortunately has actually worsened in last 24 hours. Started GOC talks 1/10.    Best Practice (right click and Reselect all SmartList Selections daily)   Diet/type: NPO DVT prophylaxis prophylactic heparin   Pressure ulcer(s): N/A GI prophylaxis: PPI Lines: N/A Foley:  N/A Code Status:  full code Last date of multidisciplinary goals of care discussion [ 1/10     CRITICAL CARE Performed by: Kannon Baum D. Harris   Total critical care time: 38 minutes  Critical care time was exclusive of separately billable procedures and treating other patients.  Critical care was necessary to treat or prevent imminent or life-threatening deterioration.  Critical care was time spent personally by me on the following activities: development of treatment plan with patient and/or surrogate as well as nursing, discussions with consultants, evaluation of patient's response to treatment, examination of patient, obtaining history from patient or surrogate, ordering and performing treatments and interventions, ordering and review of laboratory studies, ordering and review of radiographic studies, pulse oximetry and re-evaluation of patient's condition.  Kaede Clendenen D. Harris, NP-C [place] Personal contact information can be found on Amion   If no contact or response made please call [phone] 10/16/2023, 8:13 AM

## 2023-10-16 NOTE — Progress Notes (Signed)
 Called into room by RT urgently. Upon entering the room O2 sats were in the low 20s. X2 Rts at bedside, able to stabilize patient. RTs, Dr. Paola and other nurses able to enter to assist stabilizing the patient. Dr. Gretta notified, chest xray ordered. Called son Warren, but was unable to inform him at this time. ICU status maintained.

## 2023-10-16 NOTE — Progress Notes (Signed)
 Patient ID: Carla Rivera, female   DOB: 1956/09/24, 68 y.o.   MRN: [id number]    Progress Note from the Palliative Medicine Team at [facility]   Patient Name: Carla Rivera        Date: 10/16/2023 DOB: [birth year]  Age: 68 y.o. 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 Theotis. Her home regimen is composed of Symbicort  and scheduled DuoNebs. She was in her usual state of health until approximately 1/4 when her baseline dyspnea worsened gradually causing her to present to med [facility] on 1/6 with these complaints. She has been around grandchildren who have had cold symptoms recently. Upon arrival to the med center her O2 sats were in the high 70s which did improve when oxygen  was increased to 6 L to 95%. There was concern for COPD exacerbation and she was treated with the usual modalities including steroids, nebulized bronchodilators, magnesium , and antibiotics. Chest radiograph concerning for pneumonia. Despite the aforementioned treatment her condition continued to worsen as did her oxygen  requirement as well. Ultimately she required BiPAP which she was unfortunately able to tolerate. She was transferred ED to ED to [facility] where PCCM was asked to admit to ICU.   1/7 Admitted with resp failure, intubated, PNA, shock 1/9 failed wean completed 3d azithro  1/10 failed wean again  1/12 Needed increased sedation late shift yesterday and overnight. Some associated hypotension, now off pressors again  1/13 no acute events overnight, remains on low-dose fentanyl , intubated  Subjective  Extensive chart review has been completed prior to meeting with  patient/family  including labs, vital signs, imaging, progress/consult notes, orders, medications and available advance directive documents.    This NP assessed patient at the bedside as a follow up for palliative medicine needs and emotional support and to meet as scheduled with patient's son for ongoing conversation regarding goals of care.  Patient remains intubated however she is able to communicate effectively by written message on paper.   Education offered on the seriousness of her medical situation specific to end-stage COPD.    Her son/Carla Rivera tells me that yesterday he and  his mother had conversation regarding end-of-life wishes and values important to Ms. Riding.      Today she communicates the importance of quality and independence.  Her son understands that she would never want to live in a facility     Patient currently lives in the home with her son and his family.      Education/Discussion regarding extubation and need for consideration of reintubation understanding underlying chronic lung disease.  Patient and her son understand and will continue conversation and consideration for future advance care planning.  Education offered today regarding  the importance of continued conversation with family and their  medical providers regarding overall plan of care and treatment options,  ensuring decisions are within the context of the patients values and GOCs.  Questions and concerns addressed   Discussed with primary team and nursing staff   Time: 50 minutes   Discussed with Dr. Gretta and bedside RN  Detailed review of medical records ( labs, imaging, vital signs), medically appropriate exam ( MS, skin, cardiac,  resp)  discussed with treatment team, counseling and education to patient, family, staff, documenting clinical information, medication management, coordination of care    Carla Plants NP  Palliative Medicine Team Team Phone # [phone] Pager [phone]

## 2023-10-16 NOTE — Progress Notes (Signed)
 Reassessed on 8/5- breathing more comfortably, HR better controlled in 110s. Stable for extubation. She wants her son here for this-- RN to call. D/w RT to extubate once she gets call from RN. She will extubate to bipap. Discussion about despite for reintubation if required will happen post-extubation; she doesn't want to discuss now.  Leita SHAUNNA Gaskins, DO 10/16/23 1:30 PM [place] [facility]  For contact information, see Amion. If no response to pager, please call PCCM consult pager. After hours, 7PM- 7AM, please call Elink.

## 2023-10-16 NOTE — Progress Notes (Signed)
   10/16/23 2140  Provider Notification  Provider Name/Title Dr Haze MD  Date Provider Notified 10/16/23  Time Provider Notified 2130  Method of Notification Call  Notification Reason Change in status (Patient alarminf ventilator for low expiratory volume, patient barely getting volume on ventilator. Unable to bag patient, patient was alert and awake on ventilator and having what seems to be a panic attick due to inability to breath.)  Provider response See new orders  Date of Provider Response 10/16/23  Time of Provider Response 2135   Respiratory at bedside, prn givens and patient stabilized. Lost all IV access . Patient on levophed  and blood pressure starting to drop without gtt. Nurses unable to get a new line despite multiple attempts. IV team able to get one line under ultrasound and central line inserted by PCCM ground team. Patient comfortably resting, sedation and levophed  restarted.

## 2023-10-16 NOTE — Procedures (Signed)
 Central Venous Catheter Insertion Procedure Note  Carla Rivera  [id number]  [birth year]  Date:10/16/23  Time:10:21 PM   Provider Performing:Rocklyn Mayberry Meade   Procedure: Insertion of Non-tunneled Central Venous Catheter(36556) without US  guidance  Indication(s) Difficult access  Consent Unable to obtain consent due to emergent nature of procedure.  Anesthesia Topical only with 1% lidocaine    Timeout Verified patient identification, verified procedure, site/side was marked, verified correct patient position, special equipment/implants available, medications/allergies/relevant history reviewed, required imaging and test results available.  Sterile Technique Maximal sterile technique including full sterile barrier drape, hand hygiene, sterile gown, sterile gloves, mask, hair covering, sterile ultrasound probe cover (if used).  Procedure Description Area of catheter insertion was cleaned with chlorhexidine  and draped in sterile fashion.  Without real-time ultrasound guidance a central venous catheter was placed into the left subclavian vein. Nonpulsatile blood flow and easy flushing noted in all ports.  The catheter was sutured in place and sterile dressing applied.  Complications/Tolerance None; patient tolerated the procedure well. Chest X-ray is ordered to verify placement for internal jugular or subclavian cannulation.   Chest x-ray is not ordered for femoral cannulation.  EBL Minimal  Specimen(s) None   Sammi Meade, PA - C [place] [facility] For pager details, please see AMION or use Epic chat  After 1900, please call ELINK for cross coverage needs 10/16/2023, 10:21 PM

## 2023-10-16 NOTE — Progress Notes (Signed)
 RT called to pt's room for low epiratory minute ventilation alarms.  Upon assessment RT found pt to only be achieving expiratory volumes around 70-90 mls with incredibly diminsihed breath sounds.   RT was met with difficulty when attempting to bag the pt, though tube placemnt was confirmed with positive color change on the CO2 detector w/ballard catheter able to pass.  Upon improvement, Pt's breath sounds started to display expiratory and inspiratory wheezing.  RT administed x3 Duoneb, w/MD ordering another 10mg  Albuterol .   PT is currently giving appropriate expiratory volumes with no visible distress.  RT will contine to assess and monitor

## 2023-10-16 NOTE — Progress Notes (Addendum)
 Patient partially self-extubated and desaturated. RT able to re-advance tube and stabilized saturations. Dr. Paola was nearby and able to respond with RT & RN. CXR ordered now, cancelled extubation.  Leita SHAUNNA Gaskins, DO 10/16/23 2:00 PM [facility]  For contact information, see Amion. If no response to pager, please call PCCM consult pager. After hours, 7PM- 7AM, please call Elink.    Son updated via phone on this change.   Leita SHAUNNA Gaskins, DO 10/16/23 2:03 PM [facility]   Called to bedside for vent synchrony issues. Bolus sedation given, obstructed on vent with prolonged exhalation. Duonebs ordered- RT to give one now.   CXR confirms appropriate ETT placement & hyperinflation.  Leita SHAUNNA Gaskins, DO 10/16/23 2:18 PM [facility]  For contact information, see Amion. If no response to pager, please call PCCM consult pager. After hours, 7PM- 7AM, please call Elink.

## 2023-10-16 NOTE — Progress Notes (Addendum)
 eLink Physician-Brief Progress Note Patient Name: Carla Rivera DOB: 1956/06/29 MRN: [id number]   Date of Service  10/16/2023  HPI/Events of Note  68 y/o woman with a history of COPD and chronic respiratory failure on 2L O2 who presented with AE COPD due to viral infection.   Had a short partial self extubation earlier in the day.  Had significant vent dyssynchrony throughout the day.  Obstructed on the vent with prolonged exhalation.  Called about difficulty ventilating and hard to bag.  Minimal volumes.  On assessment, VT 100-200 cc.  eICU Interventions  Patient is actively receiving a nebulizer with improvement in volumes to 300-400.  Will add a one-time albuterol  10 mg.  Repeat chest radiograph x 1.  Will add scheduled albuterol  x 48hr   2140 -patient's IV has malfunction.  Patient has been stuck 4+ times in an attempt to maintain IV access but currently the patient has no access.  Will attempt to get foot IV for temporary IV access.  Will request ground team evaluation for CVC.  IV team en route for ultrasound-guided IV.  Intervention Category Major Interventions: Respiratory failure - evaluation and management  Judia Arnott 10/16/2023, 9:15 PM

## 2023-10-17 DIAGNOSIS — B348 Other viral infections of unspecified site: Secondary | ICD-10-CM | POA: Diagnosis not present

## 2023-10-17 DIAGNOSIS — J441 Chronic obstructive pulmonary disease with (acute) exacerbation: Secondary | ICD-10-CM | POA: Diagnosis not present

## 2023-10-17 DIAGNOSIS — E43 Unspecified severe protein-calorie malnutrition: Secondary | ICD-10-CM | POA: Diagnosis not present

## 2023-10-17 DIAGNOSIS — Z7189 Other specified counseling: Secondary | ICD-10-CM | POA: Diagnosis not present

## 2023-10-17 DIAGNOSIS — Z9911 Dependence on respirator [ventilator] status: Secondary | ICD-10-CM

## 2023-10-17 DIAGNOSIS — J449 Chronic obstructive pulmonary disease, unspecified: Secondary | ICD-10-CM | POA: Diagnosis not present

## 2023-10-17 DIAGNOSIS — J189 Pneumonia, unspecified organism: Secondary | ICD-10-CM | POA: Diagnosis not present

## 2023-10-17 DIAGNOSIS — J9621 Acute and chronic respiratory failure with hypoxia: Secondary | ICD-10-CM | POA: Diagnosis not present

## 2023-10-17 LAB — GLUCOSE, CAPILLARY
Glucose-Capillary: 131 mg/dL — ABNORMAL HIGH (ref 70–99)
Glucose-Capillary: 131 mg/dL — ABNORMAL HIGH (ref 70–99)
Glucose-Capillary: 144 mg/dL — ABNORMAL HIGH (ref 70–99)
Glucose-Capillary: 146 mg/dL — ABNORMAL HIGH (ref 70–99)
Glucose-Capillary: 164 mg/dL — ABNORMAL HIGH (ref 70–99)
Glucose-Capillary: 186 mg/dL — ABNORMAL HIGH (ref 70–99)

## 2023-10-17 LAB — BASIC METABOLIC PANEL
Anion gap: 10 (ref 5–15)
BUN: 18 mg/dL (ref 8–23)
CO2: 35 mmol/L — ABNORMAL HIGH (ref 22–32)
Calcium: 9 mg/dL (ref 8.9–10.3)
Chloride: 90 mmol/L — ABNORMAL LOW (ref 98–111)
Creatinine, Ser: 0.51 mg/dL (ref 0.44–1.00)
GFR, Estimated: >60 mL/min (ref >60)
Glucose, Bld: 120 mg/dL — ABNORMAL HIGH (ref 70–99)
Potassium: 4.8 mmol/L (ref 3.5–5.1)
Sodium: 135 mmol/L (ref 135–145)

## 2023-10-17 LAB — CBC
HCT: 28.8 % — ABNORMAL LOW (ref 36.0–46.0)
Hemoglobin: 8.6 g/dL — ABNORMAL LOW (ref 12.0–15.0)
MCH: 28.6 pg (ref 26.0–34.0)
MCHC: 29.9 g/dL — ABNORMAL LOW (ref 30.0–36.0)
MCV: 95.7 fL (ref 80.0–100.0)
Platelets: 365 10*3/uL (ref 150–400)
RBC: 3.01 MIL/uL — ABNORMAL LOW (ref 3.87–5.11)
RDW: 14.4 % (ref 11.5–15.5)
WBC: 16.7 10*3/uL — ABNORMAL HIGH (ref 4.0–10.5)
nRBC: 0.2 % (ref 0.0–0.2)

## 2023-10-17 MED ORDER — SODIUM CHLORIDE 0.9% FLUSH: 10.0000 mL | INTRAVENOUS | Status: DC | PRN

## 2023-10-17 MED ORDER — ALTEPLASE 2 MG IJ SOLR: 2.0000 mg | Freq: Once | INTRAMUSCULAR | Status: DC

## 2023-10-17 MED ORDER — SODIUM CHLORIDE 0.9% FLUSH
10.0000 mL | Freq: Two times a day (BID) | INTRAVENOUS | Status: DC
  Administered 2023-10-17 – 2023-10-20 (×7): 10 mL
  Administered 2023-10-21: 20 mL
  Administered 2023-10-21: 30 mL
  Administered 2023-10-22 – 2023-10-23 (×3): 10 mL
  Administered 2023-10-23: 20 mL
  Administered 2023-10-24: 30 mL
  Administered 2023-10-24 – 2023-10-25 (×3): 10 mL
  Administered 2023-10-26: 20 mL
  Administered 2023-10-26: 10 mL
  Administered 2023-10-27: 40 mL
  Administered 2023-10-27 – 2023-10-29 (×5): 10 mL

## 2023-10-17 MED ORDER — POLYETHYLENE GLYCOL 3350 17 G PO PACK
17.0000 g | PACK | Freq: Two times a day (BID) | ORAL | Status: DC
  Administered 2023-10-17 – 2023-10-28 (×10): 17 g
  Filled 2023-10-17 (×11): qty 1

## 2023-10-17 MED ORDER — FUROSEMIDE 10 MG/ML IJ SOLN
60.0000 mg | Freq: Once | INTRAMUSCULAR | Status: AC
  Administered 2023-10-17: 60 mg via INTRAVENOUS
  Filled 2023-10-17: qty 6

## 2023-10-17 MED ORDER — SENNA 8.6 MG PO TABS
1.0000 | ORAL_TABLET | Freq: Two times a day (BID) | ORAL | Status: DC
Start: 2023-10-17 — End: 2023-10-30
  Administered 2023-10-17 – 2023-10-28 (×12): 8.6 mg
  Filled 2023-10-17 (×13): qty 1

## 2023-10-17 MED ORDER — ALTEPLASE 2 MG IJ SOLR
2.0000 mg | Freq: Once | INTRAMUSCULAR | Status: AC
  Administered 2023-10-17: 2 mg
  Filled 2023-10-17: qty 2

## 2023-10-17 NOTE — Progress Notes (Signed)
 eLink Physician-Brief Progress Note Patient Name: Carla Rivera DOB: [birth year] MRN: [id number]   Date of Service  10/17/2023  HPI/Events of Note  67 y/o woman with a history of COPD and chronic respiratory failure on 2L O2 who presented with AE COPD due to viral infection.   Unable to get return from 2 of 3 ports.  Sluggish in the brown port.  eICU Interventions  Line is okay to use, suspect this is secondary to hemodynamics.  Can try tPA in the lines.     Intervention Category Minor Interventions: Routine modifications to care plan (e.g. PRN medications for pain, fever)  Aleda Madl 10/17/2023, 7:51 PM

## 2023-10-17 NOTE — Progress Notes (Signed)
 Upon assessment pt has no blood return in blue and white lumens with sluggish return in brown lumen. Elink called to notify.

## 2023-10-17 NOTE — Progress Notes (Addendum)
 This chaplain responded to PMT NP-Mary consult for spiritual care. The chaplain understands the Pt. and Pt. son-Jamie requested a visit and prayer.  The chaplain received an update from the Pt. RN-Sydney. Family is not at the bedside. The chaplain understands Warren works second shift and visits the Pt. early morning.   The chaplain will make a referral for the on-call chaplain. Please page spiritual care if Pt. and family desire a visit tonight.   This chaplain will F/U with spiritual care as needed.  Chaplain Leeroy Hummer [phone]

## 2023-10-17 NOTE — Progress Notes (Signed)
 NAME:  Carla Rivera, MRN:  [id number], DOB:  [birth year], LOS: 7 ADMISSION DATE:  10/09/2023, CONSULTATION DATE:  1/7 REFERRING MD:  Dr. 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 Theotis.  Her home regimen is composed of Symbicort  and scheduled DuoNebs.  She was in her usual state of health until approximately 1/4 when her baseline dyspnea worsened gradually causing her to present to med [facility] on 1/6 with these complaints.  She has been around grandchildren who have had cold symptoms recently.  Upon arrival to the med center her O2 sats were in the high 70s which did improve when oxygen  was increased to 6 L to 95%.  There was concern for COPD exacerbation and she was treated with the usual modalities including steroids, nebulized bronchodilators, magnesium , and antibiotics.  Chest radiograph concerning for pneumonia.  Despite the aforementioned treatment her condition continued to worsen as did her oxygen  requirement as well.  Ultimately she required BiPAP which she was unfortunately able to tolerate.  She was transferred ED to ED to [facility] where PCCM was asked to admit to ICU.  Pertinent  Medical History   has a past medical history of Arthritis, COPD (chronic obstructive pulmonary disease) (HCC), Depression, and HTN (hypertension).   Significant Hospital Events: Including procedures, antibiotic start and stop dates in addition to other pertinent events   1/7 Admitted with resp failure, intubated, PNA, shock 1/9 failed wean completed 3d azithro  1/10 failed wean again  1/12 Needed increased sedation late shift yesterday and overnight. Some associated hypotension, now off pressors again  1/13 no acute events overnight, remains on low-dose fentanyl , intubated  Interim History /  Subjective:  She denies complaints.  Episode of bronchospasm overnight, central line was placed  Objective   Blood pressure 123/64, pulse 96, temperature 98.3 F (36.8 C), temperature source Axillary, resp. rate (!) 33, height 5' (1.524 m), weight 52.5 kg, SpO2 100%.    Vent Mode: PRVC FiO2 (%):  [40 %-100 %] 40 % Set Rate:  [18 bmp] 18 bmp Vt Set:  [380 mL] 380 mL PEEP:  [5 cmH20] 5 cmH20 Pressure Support:  [8 cmH20] 8 cmH20 Plateau Pressure:  [26 cmH20-29 cmH20] 29 cmH20   Intake/Output Summary (Last 24 hours) at 10/17/2023 1231 Last data filed at 10/17/2023 0900 Gross per 24 hour  Intake 1716.72 ml  Output 320 ml  Net 1396.72 ml   Filed Weights   10/15/23 0434 10/16/23 0703 10/17/23 0706  Weight: 52.5 kg 52.5 kg 52.5 kg    Examination: General: Chronically ill-appearing woman lying in bed intubated, sedated HEENT: [place]/AT, eyes anicteric Neuro: Awake and alert, answering questions by nodding yes and no.  Moving all extremities on command. CV: S1-S2, tachycardic, regular rhythm,  PULM: No wheezing, breathing comfortably on PRVC, within an hour using accessory muscles on pressure support 8 with tachypnea. GI: Soft, nontender, nondistended Extremities: No upper edema, no cyanosis Skin: No rash, skin warm and dry  I/O + 1.4L, net +9L for admission  Resolved Hospital Problem list   Shock  Hypotension  Assessment & Plan:   Acute on chronic hypoxic and hypercarbic respiratory failure on 2 L nasal cannula at baseline AECOPD w end stage COPD, POA  Pneumococcal PNA, Rhinovirus and coronavirus OC43 infections -Completed 3 days of azithromycin  P:  -  LTV V - VAP prevention protocol - PAD protocol for sedation - Daily SAT and SBT.  Failed, talk to son at bedside.  He is unsure if she would want trach.  Per previous discussions with palliative care she is indicated she would not want to be chronically dependent on others.  Son wants to talk to his mother to determine next best  steps. - Continue bronchodilators-Brovana , Yupelri , Pulmicort  plus DuoNebs as needed - Continue steroids, have not weaned these since vent weaning has been the biggest priority  Essential HTN  -con't holding PTA antihypertensives  Anemia-- acute on chronic  -Transfuse for hemoglobin less than 7 or hemodynamically significant bleeding - Monitor  Severe protein calorie malnutrition  -Continue tube feeds and protein supplements  Urinary retention - Continue bethanechol   Hypervolemia based on I's and O's, euvolemic on exam - Lasix   GOC -end stage COPD, failing to make progress towards extubation & unfortunately has actually worsened in last 24 hours.  Ongoing goals of care discussions with son today.   Best Practice (right click and Reselect all SmartList Selections daily)   Diet/type: tubefeeds DVT prophylaxis prophylactic heparin   Pressure ulcer(s): N/A GI prophylaxis: PPI Lines: Central line Foley:  N/A Code Status:  full code Last date of multidisciplinary goals of care discussion [ 1/10    This patient is critically ill with multiple organ system failure which requires frequent high complexity decision making, assessment, support, evaluation, and titration of therapies. This was completed through the application of advanced monitoring technologies and extensive interpretation of multiple databases. During this encounter critical care time was devoted to patient care services described in this note for 60 minutes.  Leita SHAUNNA Gaskins, DO 10/17/23 12:43 PM [place] [facility]  For contact information, see Amion. If no response to pager, please call PCCM consult pager. After hours, 7PM- 7AM, please call Elink.

## 2023-10-17 NOTE — Plan of Care (Signed)
 Family at bedside to discuss plan of care with provider. PRN Bolus's given during shift for pain, patient showed signs of relief. Patient and family updated in plan of care. ICU status maintained.  Problem: Education: Goal: Knowledge of General Education information will improve Description: Including pain rating scale, medication(s)/side effects and non-pharmacologic comfort measures Outcome: Not Progressing   Problem: Health Behavior/Discharge Planning: Goal: Ability to manage health-related needs will improve Outcome: Not Progressing   Problem: Clinical Measurements: Goal: Ability to maintain clinical measurements within normal limits will improve Outcome: Not Progressing Goal: Will remain free from infection Outcome: Not Progressing Goal: Diagnostic test results will improve Outcome: Not Progressing Goal: Respiratory complications will improve Outcome: Not Progressing Goal: Cardiovascular complication will be avoided Outcome: Not Progressing   Problem: Activity: Goal: Risk for activity intolerance will decrease Outcome: Not Progressing   Problem: Nutrition: Goal: Adequate nutrition will be maintained Outcome: Not Progressing   Problem: Coping: Goal: Level of anxiety will decrease Outcome: Not Progressing   Problem: Elimination: Goal: Will not experience complications related to bowel motility Outcome: Not Progressing Goal: Will not experience complications related to urinary retention Outcome: Not Progressing   Problem: Pain Management: Goal: General experience of comfort will improve Outcome: Not Progressing   Problem: Safety: Goal: Ability to remain free from injury will improve Outcome: Not Progressing   Problem: Skin Integrity: Goal: Risk for impaired skin integrity will decrease Outcome: Not Progressing   Problem: Education: Goal: Ability to describe self-care measures that may prevent or decrease complications (Diabetes Survival Skills Education) will  improve Outcome: Not Progressing Goal: Individualized Educational Video(s) Outcome: Not Progressing   Problem: Coping: Goal: Ability to adjust to condition or change in health will improve Outcome: Not Progressing   Problem: Fluid Volume: Goal: Ability to maintain a balanced intake and output will improve Outcome: Not Progressing   Problem: Health Behavior/Discharge Planning: Goal: Ability to identify and utilize available resources and services will improve Outcome: Not Progressing Goal: Ability to manage health-related needs will improve Outcome: Not Progressing   Problem: Metabolic: Goal: Ability to maintain appropriate glucose levels will improve Outcome: Not Progressing   Problem: Nutritional: Goal: Maintenance of adequate nutrition will improve Outcome: Not Progressing Goal: Progress toward achieving an optimal weight will improve Outcome: Not Progressing   Problem: Skin Integrity: Goal: Risk for impaired skin integrity will decrease Outcome: Not Progressing   Problem: Tissue Perfusion: Goal: Adequacy of tissue perfusion will improve Outcome: Not Progressing

## 2023-10-17 NOTE — Progress Notes (Signed)
 Follow up visit to day [employer].  Pt's son and daughter were at bedside.  Chaplain offered ministry of presence listening to primarily the son as he described his mother's history.  Chaplain expected to hear son was having difficulty making decision to move his mother to comfort care.  Instead son recounted a conversation with another palliative care provider wherein what  he heard was his mother is better today.  This has given him more hope she will recover.  Chaplain commended pt's children on being at her side.  Asked how we might comfort them or help them.  Son replied nothing right now (inc offer to pray at bedside) because he had a lot of things running around in his mind.  Chaplain on standby through the night if needed.  [place] Chaplain

## 2023-10-17 NOTE — Progress Notes (Signed)
 Patient ID: [name], female   DOB: 1956/07/03, 68 y.o.   MRN: [id number]    Progress Note from the Palliative Medicine Team at [facility]   Patient Name: Carla Rivera        Date: 10/17/2023 DOB: Jun 18, 1956  Age: 68 y.o. 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 Theotis.   Admitted  through  med center her O2 sats were in the high 70s which did improve when oxygen  was increased to 6 L to 95%.   There was concern for COPD exacerbation and she was treated with the usual modalities including steroids, nebulized bronchodilators, magnesium , and antibiotics.   Chest radiograph concerning for pneumonia. Despite the aforementioned treatment her condition continued to worsen as did her oxygen  requirement as well.   Ultimately she required BiPAP which she was unfortunately able to tolerate. She was transferred  to [facility] /admit to ICU.   1/7 Admitted with resp failure, intubated, PNA, shock 1/9 failed wean completed 3d azithro  1/10 failed wean again  1/12 Needed increased sedation late shift yesterday and overnight. Some associated hypotension, now off pressors again  1/13 no acute events overnight, remains on low-dose fentanyl , intubated 1/14 unable to successfully wean from vent, further respiratory decompensation over the past 12 hours  Son faces ongoing decisions regarding treatment options, advanced directives   Subjective  Extensive chart review has been completed prior to meeting with patient/family  including labs, vital signs, imaging, progress/consult notes, orders, medications and available advance directive documents.    This NP  assessed patient at the bedside as a follow up for palliative medicine needs and emotional support.  No family at bedside.  Patient has declined since I met with her yesterday, she is unable to communicate  today.  She remains vent dependant and requiring pressors for BP support.  Concern for further decompensation.  I spoke to her son/Jamie by telephone. Ongoing education regarding changes over the past 24 hours and  concerns of treatment team.  Son understands the seriousness, he remains hopeful for ongoing treatment and improvements.  He realizes patient's high risk for further decompensation, he needs time for outcomes.    Encouraged him to consider his mother's values, and quality of life in decision making  process.       Education offered today regarding  the importance of continued conversation with the medical providers regarding overall plan of care and treatment options,  ensuring decisions are within the context of the patients values and GOCs.  Questions and concerns addressed   Discussed with primary team and nursing staff   Time: 50 minutes    Detailed review of medical records ( labs, imaging, vital signs), medically appropriate exam ( MS, skin, cardiac,  resp)   discussed with treatment team, counseling and education to patient, family, staff, documenting clinical information, medication management, coordination of care    Ronal Plants NP  Palliative Medicine Team Team Phone # [phone] Pager [phone]

## 2023-10-18 ENCOUNTER — Inpatient Hospital Stay (HOSPITAL_COMMUNITY): Payer: Medicare PPO

## 2023-10-18 DIAGNOSIS — J441 Chronic obstructive pulmonary disease with (acute) exacerbation: Secondary | ICD-10-CM | POA: Diagnosis not present

## 2023-10-18 DIAGNOSIS — Z9911 Dependence on respirator [ventilator] status: Secondary | ICD-10-CM | POA: Diagnosis not present

## 2023-10-18 LAB — GLUCOSE, CAPILLARY
Glucose-Capillary: 101 mg/dL — ABNORMAL HIGH (ref 70–99)
Glucose-Capillary: 105 mg/dL — ABNORMAL HIGH (ref 70–99)
Glucose-Capillary: 132 mg/dL — ABNORMAL HIGH (ref 70–99)
Glucose-Capillary: 142 mg/dL — ABNORMAL HIGH (ref 70–99)
Glucose-Capillary: 150 mg/dL — ABNORMAL HIGH (ref 70–99)
Glucose-Capillary: 180 mg/dL — ABNORMAL HIGH (ref 70–99)
Glucose-Capillary: 98 mg/dL (ref 70–99)

## 2023-10-18 MED ORDER — BISACODYL 10 MG RE SUPP
10.0000 mg | Freq: Once | RECTAL | Status: AC
  Administered 2023-10-18: 10 mg via RECTAL
  Filled 2023-10-18: qty 1

## 2023-10-18 MED ORDER — FUROSEMIDE 10 MG/ML IJ SOLN
60.0000 mg | Freq: Three times a day (TID) | INTRAMUSCULAR | Status: AC
  Administered 2023-10-18 – 2023-10-19 (×3): 60 mg via INTRAVENOUS
  Filled 2023-10-18 (×3): qty 6

## 2023-10-18 MED ORDER — LACTULOSE 10 GM/15ML PO SOLN
20.0000 g | ORAL | Status: AC
  Administered 2023-10-18 (×2): 20 g
  Filled 2023-10-18 (×2): qty 30

## 2023-10-18 MED ORDER — ALTEPLASE 2 MG IJ SOLR
2.0000 mg | Freq: Once | INTRAMUSCULAR | Status: AC
  Administered 2023-10-18: 2 mg
  Filled 2023-10-18: qty 2

## 2023-10-18 NOTE — IPAL (Signed)
  Interdisciplinary Goals of Care Family Meeting   Date carried out:: 10/18/2023  Location of the meeting: Bedside  Member's involved: Physician, Bedside Registered Nurse, and Family Member or next of kin  Durable Power of Attorney or acting medical decision maker: son  Discussion: We discussed goals of care for [employer] .  I met with Mrs. Moraski and her son at bedside.  She failed another SBT today with ongoing increased work of breathing despite being back on full ventilatory support.  RN at bedside.  We reviewed need for potential tracheostomy and my concern that she would fail chronic vent weaning due to severe baseline COPD and failure to improve during her hospital course here.  If she required chronic trach and vent she would require specialist no facility which likely would not be near her home as these are relatively rare.  We discussed whether she would be okay with permanently requiring help from others, limited mobility, and living in a facility.  Her son needs more time to consider this.  We also reviewed the option of focusing on her comfort and excising her quality of life for what ever time she has left with comfort focused care.  All questions were answered  Code status: Full Code  Disposition: Continue current acute care   Time spent for the meeting: 15 min.  Joesph Mussel 10/18/2023, 9:36 AM

## 2023-10-18 NOTE — Progress Notes (Signed)
 Consult for CL line with no blood return from medial and distal port of [place] CVC. Able to flush and get blood return from distal. Easy flush but no blood return for medial port. CXR from 1-13 with increased reflection at at last 3 cm of CVC with possibility of a curl at the the tip. Primary RN notified to possibly obtain order for 2 view CXR to insure no malposition of tip into azygous. If not in azygous alteplase  would be indicated.

## 2023-10-18 NOTE — Progress Notes (Signed)
 eLink Physician-Brief Progress Note Patient Name: Lyanne Schutter DOB: Mar 23, 1956 MRN: [id number]   Date of Service  10/18/2023  HPI/Events of Note   medial & distal ports on central line have no blood return...had same issue last night, line got tPA.    eICU Interventions  pCXR to assess position of CVL Bedside team to evaluate in am     Intervention Category Intermediate Interventions: Other:  Jennifer Moellers Laymond Postle 10/18/2023, 9:17 PM

## 2023-10-18 NOTE — Progress Notes (Signed)
 NAME:  Carla Rivera, MRN:  [id number], DOB:  09-20-1956, LOS: 8 ADMISSION DATE:  10/09/2023, CONSULTATION DATE:  1/7 REFERRING MD:  Dr. 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 Jamal Mays.  Her home regimen is composed of Symbicort  and scheduled DuoNebs.  She was in her usual state of health until approximately 1/4 when her baseline dyspnea worsened gradually causing her to present to med [facility] on 1/6 with these complaints.  She has been around grandchildren who have had cold symptoms recently.  Upon arrival to the med center her O2 sats were in the high 70s which did improve when oxygen  was increased to 6 L to 95%.  There was concern for COPD exacerbation and she was treated with the usual modalities including steroids, nebulized bronchodilators, magnesium , and antibiotics.  Chest radiograph concerning for pneumonia.  Despite the aforementioned treatment her condition continued to worsen as did her oxygen  requirement as well.  Ultimately she required BiPAP which she was unfortunately able to tolerate.  She was transferred ED to ED to [facility] where PCCM was asked to admit to ICU.  Pertinent  Medical History   has a past medical history of Arthritis, COPD (chronic obstructive pulmonary disease) (HCC), Depression, and HTN (hypertension).   Significant Hospital Events: Including procedures, antibiotic start and stop dates in addition to other pertinent events   1/7 Admitted with resp failure, intubated, PNA, shock 1/9 failed wean completed 3d azithro  1/10 failed wean again  1/12 Needed increased sedation late shift yesterday and overnight. Some associated hypotension, now off pressors again  1/13 no acute events overnight, remains on low-dose fentanyl , intubated  Interim History /  Subjective:  Failed SBT due to tachycardia, WOB. This morning she is hot and uncomfortable. Tmax 100.3.  Objective   Blood pressure (!) 116/58, pulse (!) 106, temperature 98.3 F (36.8 C), temperature source Axillary, resp. rate (!) 24, height 5' (1.524 m), weight 55.5 kg, SpO2 99%.    Vent Mode: PSV;CPAP FiO2 (%):  [40 %] 40 % Set Rate:  [18 bmp] 18 bmp Vt Set:  [380 mL] 380 mL PEEP:  [5 cmH20] 5 cmH20 Pressure Support:  [8 cmH20-10 cmH20] 10 cmH20 Plateau Pressure:  [19 cmH20-27 cmH20] 27 cmH20   Intake/Output Summary (Last 24 hours) at 10/18/2023 0923 Last data filed at 10/18/2023 0800 Gross per 24 hour  Intake 2319.94 ml  Output 1785 ml  Net 534.94 ml   Filed Weights   10/16/23 0703 10/17/23 0706 10/18/23 0500  Weight: 52.5 kg 52.5 kg 55.5 kg    Examination: General: ill appearing woman lying in bed in mild distress, on full  vent support HEENT: [place]/AT, eyes anicteric Neuro: Awake and alert, nodding to answer questions, moving all extremities. CV: S1-S2, tachycardic, regular rhythm PULM: Tachypnea, air hungry, not using accessory muscles.  No wheezing.  Minimal thick secretions from endotracheal tube GI: Soft nontender Extremities: No edema or cyanosis Skin: Warm, dry  I/O + 0.6L, net +9.7L for admission  BUN 18 Cr 0.51 WBC 16.7 H/H 8.6/28.8 Platelets 365  Resolved Hospital Problem list   Shock  Hypotension  Assessment & Plan:   Acute on chronic hypoxic and hypercarbic respiratory failure on 2 L nasal cannula at baseline AECOPD w end stage COPD, POA  Pneumococcal PNA, Rhinovirus and coronavirus  OC43 infections -Completed 3 days of azithromycin  P:  - Collect trach aspirate culture - LTVV - Failed SBT today - PAD protocol - VAP prevention protocol - See ipal note.  Ongoing discussions regarding trach versus comfort care.  Long-term she is less likely to be weaned for mechanical ventilation due to baseline severe COPD. -Continue bronchodilators - Continue  steroids, have not weaned these since vent weaning has been  Essential HTN ; hypotensive on sedation -Continue to hold PTA antihypertensives  Anemia-- acute on chronic  Transfuse for hemoglobin less than 7 or hemodynamically significant bleeding, continue to monitor  Severe protein calorie malnutrition  -Tube feeds  Urinary retention -Continue bethanechol   Hypervolemia based on I's and O's, euvolemic on exam - More aggressive diuresis  GOC -see ipal note   Best Practice (right click and "Reselect all SmartList Selections" daily)   Diet/type: tubefeeds DVT prophylaxis prophylactic heparin   Pressure ulcer(s): N/A GI prophylaxis: PPI Lines: Central line Foley:  N/A Code Status:  full code Last date of multidisciplinary goals of care discussion [ 1/10    This patient is critically ill with multiple organ system failure which requires frequent high complexity decision making, assessment, support, evaluation, and titration of therapies. This was completed through the application of advanced monitoring technologies and extensive interpretation of multiple databases. During this encounter critical care time was devoted to patient care services described in this note for 45 minutes.  Joesph Mussel, DO 10/18/23 9:40 AM [facility]  For contact information, see Amion. If no response to pager, please call PCCM consult pager. After hours, 7PM- 7AM, please call Elink.

## 2023-10-19 DIAGNOSIS — J9621 Acute and chronic respiratory failure with hypoxia: Secondary | ICD-10-CM | POA: Diagnosis not present

## 2023-10-19 DIAGNOSIS — J9622 Acute and chronic respiratory failure with hypercapnia: Secondary | ICD-10-CM | POA: Diagnosis not present

## 2023-10-19 DIAGNOSIS — Z9911 Dependence on respirator [ventilator] status: Secondary | ICD-10-CM | POA: Diagnosis not present

## 2023-10-19 LAB — BASIC METABOLIC PANEL
Anion gap: 10 (ref 5–15)
BUN: 13 mg/dL (ref 8–23)
CO2: 38 mmol/L — ABNORMAL HIGH (ref 22–32)
Calcium: 8.7 mg/dL — ABNORMAL LOW (ref 8.9–10.3)
Chloride: 86 mmol/L — ABNORMAL LOW (ref 98–111)
Creatinine, Ser: 0.56 mg/dL (ref 0.44–1.00)
GFR, Estimated: >60 mL/min (ref >60)
Glucose, Bld: 122 mg/dL — ABNORMAL HIGH (ref 70–99)
Potassium: 3.9 mmol/L (ref 3.5–5.1)
Sodium: 134 mmol/L — ABNORMAL LOW (ref 135–145)

## 2023-10-19 LAB — GLUCOSE, CAPILLARY
Glucose-Capillary: 102 mg/dL — ABNORMAL HIGH (ref 70–99)
Glucose-Capillary: 112 mg/dL — ABNORMAL HIGH (ref 70–99)
Glucose-Capillary: 130 mg/dL — ABNORMAL HIGH (ref 70–99)
Glucose-Capillary: 137 mg/dL — ABNORMAL HIGH (ref 70–99)
Glucose-Capillary: 141 mg/dL — ABNORMAL HIGH (ref 70–99)
Glucose-Capillary: 143 mg/dL — ABNORMAL HIGH (ref 70–99)

## 2023-10-19 MED ORDER — INFLUENZA VAC A&B SURF ANT ADJ 0.5 ML IM SUSY
0.5000 mL | PREFILLED_SYRINGE | INTRAMUSCULAR | Status: AC
  Administered 2023-10-20: 0.5 mL via INTRAMUSCULAR
  Filled 2023-10-19: qty 0.5

## 2023-10-19 MED ORDER — PIPERACILLIN-TAZOBACTAM 3.375 G IVPB
3.3750 g | Freq: Three times a day (TID) | INTRAVENOUS | Status: AC
  Administered 2023-10-19 – 2023-10-23 (×15): 3.375 g via INTRAVENOUS
  Filled 2023-10-19 (×14): qty 50

## 2023-10-19 MED ORDER — PNEUMOCOCCAL 20-VAL CONJ VACC 0.5 ML IM SUSY
0.5000 mL | PREFILLED_SYRINGE | INTRAMUSCULAR | Status: AC
Start: 2023-10-20 — End: 2023-10-20
  Administered 2023-10-20: 0.5 mL via INTRAMUSCULAR
  Filled 2023-10-19: qty 0.5

## 2023-10-19 NOTE — Discharge Instructions (Signed)
Patient education given on *** and the patient expresses understanding and acceptance of instructions. Rosita Guzzetta C 10/19/2023 11:50 AM

## 2023-10-19 NOTE — Progress Notes (Signed)
NAME:  Carla Rivera, MRN:  [id number], DOB:  [birth year], LOS: 9 ADMISSION DATE:  10/09/2023, CONSULTATION DATE:  1/7 REFERRING MD:  Dr. 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 Meredeth Ide.  Her home regimen is composed of Symbicort and scheduled DuoNebs.  She was in her usual state of health until approximately 1/4 when her baseline dyspnea worsened gradually causing her to present to med [facility] on 1/6 with these complaints.  She has been around grandchildren who have had cold symptoms recently.  Upon arrival to the med center her O2 sats were in the high 70s which did improve when oxygen was increased to 6 L to 95%.  There was concern for COPD exacerbation and she was treated with the usual modalities including steroids, nebulized bronchodilators, magnesium, and antibiotics.  Chest radiograph concerning for pneumonia.  Despite the aforementioned treatment her condition continued to worsen as did her oxygen requirement as well.  Ultimately she required BiPAP which she was unfortunately able to tolerate.  She was transferred ED to ED to [facility] where PCCM was asked to admit to ICU.  Pertinent  Medical History   has a past medical history of Arthritis, COPD (chronic obstructive pulmonary disease) (HCC), Depression, and HTN (hypertension).   Significant Hospital Events: Including procedures, antibiotic start and stop dates in addition to other pertinent events   1/7 Admitted with resp failure, intubated, PNA, shock 1/9 failed wean completed 3d azithro  1/10 failed wean again  1/12 Needed increased sedation late shift yesterday and overnight. Some associated hypotension, now off pressors again  1/13 no acute events overnight, remains on low-dose fentanyl, intubated  Interim History /  Subjective:  CVC not working 2/3 lumens. Tmax 100.4.  Objective   Blood pressure 101/68, pulse (!) 184, temperature 99 F (37.2 C), temperature source Axillary, resp. rate 16, height 5' (1.524 m), weight 54.7 kg, SpO2 100%.    Vent Mode: PRVC FiO2 (%):  [40 %] 40 % Set Rate:  [18 bmp] 18 bmp Vt Set:  [380 mL] 380 mL PEEP:  [5 cmH20] 5 cmH20 Pressure Support:  [10 cmH20] 10 cmH20   Intake/Output Summary (Last 24 hours) at 10/19/2023 0731 Last data filed at 10/19/2023 0700 Gross per 24 hour  Intake 2883.08 ml  Output 2740 ml  Net 143.08 ml   Filed Weights   10/17/23 0706 10/18/23 0500 10/19/23 0500  Weight: 52.5 kg 55.5 kg 54.7 kg    Examination: General: chronically ill appearing woman lying in bed in NAD, intubated, sedated HEENT: [place]/AT, eyes anicteric, ETT in place Neuro: aRASS -3, +cough reflex CV: S1-S2, regular rate and rhythm PULM: No tachypnea, mild vent dyssynchrony, no endotracheal secretions.  No wheezing, CTAB. GI: Soft, nontender Extremities: No cyanosis or edema Skin:, Dry, no rashes  I/O net even over past 24 hours, net +9.8L for admission   Trach aspirate culture> few PMN  Resolved Hospital Problem list   Shock  Hypotension  Assessment & Plan:   Acute on chronic hypoxic and hypercarbic respiratory failure on 2 L nasal cannula at baseline AECOPD w end stage COPD, POA  Pneumococcal PNA, Rhinovirus and coronavirus OC43 infections -Completed 3 days of azithromycin P:  -follow trach aspirate culture -start zosyn, check MRSA nares -LTV - Daily SAT SBT, ongoing discussion with family regarding  compassionate extubation versus tracheostomy. - PAD protocol for sedation - VAP prevention protocol - Continue bronchodilators and steroids  Essential HTN ; hypotensive on sedation -Continue holding PTA antihypertensives -Norepinephrine as required to attain MAP greater than 65 with sedation right CVC in place.  If she is going to continue aggressive care would  transition to PICC line.  Anemia-- acute on chronic  -Transfuse for hemoglobin less than 7 or hemodynamically significant bleeding, periodically monitor  Severe protein calorie malnutrition  Continue tube feeds  Urinary retention -Continue bethanechol  Hypervolemia based on I's and O's, euvolemic on exam - Continue diuresis, check electrolytes today  GOC -Daily discussions with son   Best Practice (right click and "Reselect all SmartList Selections" daily)   Diet/type: tubefeeds DVT prophylaxis prophylactic heparin  Pressure ulcer(s): N/A GI prophylaxis: PPI Lines: Central line Foley:  N/A Code Status:  full code Last date of multidisciplinary goals of care discussion [ 1/15    This patient is critically ill with multiple organ system failure which requires frequent high complexity decision making, assessment, support, evaluation, and titration of therapies. This was completed through the application of advanced monitoring technologies and extensive interpretation of multiple databases. During this encounter critical care time was devoted to patient care services described in this note for 34 minutes.  Steffanie Dunn, DO 10/19/23 8:27 AM [facility]  For contact information, see Amion. If no response to pager, please call PCCM consult pager. After hours, 7PM- 7AM, please call Elink.

## 2023-10-19 NOTE — Progress Notes (Signed)
Pharmacy Antibiotic Note  Kalley Thatch is a 68 y.o. female admitted on 10/09/2023 with pneumonia.  Pharmacy has been consulted for Zosyn dosing.  Plan: Zosyn 3.375g IV q8h (4 hour infusion). Follow culture data for de-escalation.  Monitor renal function for dose adjustments as indicated.   Height: 5' (152.4 cm) Weight: 54.7 kg (120 lb 9.5 oz) IBW/kg (Calculated) : 45.5  Temp (24hrs), Avg:99.2 F (37.3 C), Min:97.9 F (36.6 C), Max:100.4 F (38 C)  Recent Labs  Lab 10/13/23 0546 10/14/23 0400 10/17/23 2114  WBC 16.2*  --  16.7*  CREATININE 0.56 0.57 0.51    Estimated Creatinine Clearance: 52.3 mL/min (by C-G formula based on SCr of 0.51 mg/dL).    Allergies  Allergen Reactions   Penicillins Hives, Rash and Other (See Comments)    Thank you for allowing pharmacy to be a part of this patient's care.  Estill Batten, PharmD, BCCCP  10/19/2023 8:50 AM

## 2023-10-19 NOTE — Progress Notes (Signed)
Initial Nutrition Assessment  DOCUMENTATION CODES:   Severe malnutrition in context of chronic illness  INTERVENTION:   Continue tube feeding via Cortrak tube:  Osmolite 1.2  at 50 ml/h (1200 ml per day) Provides 1440 kcal, 67 gm protein, 984 ml free water daily   NUTRITION DIAGNOSIS:   Severe Malnutrition related to chronic illness (COPD) as evidenced by severe fat depletion, severe muscle depletion. Ongoing.   GOAL:   Patient will meet greater than or equal to 90% of their needs Met with TF at goal   MONITOR:   TF tolerance, Vent status, Labs  REASON FOR ASSESSMENT:   Consult Enteral/tube feeding initiation and management  ASSESSMENT:   Pt with hx of COPD on home O2 and HTN presented to ED with worsening SOB. Found to have pneumonia on imaging and required intubation.  Pt discussed during ICU rounds and with RN and MD.  Per MD daily SBT with ongoing discussions with family on GOC - trach vs compassionate extubation    01/07-admitted to ICU, intubated; Cortrak placed, tube feeding initiated  Medications reviewed and include: colace, solumedrol, protonix, miralax, senna  Precedex Fentanyl  Levophed @ 7 mcg   Labs reviewed:  CBG's: 102-142   Diet Order:   Diet Order             Diet NPO time specified  Diet effective now                   EDUCATION NEEDS:   Not appropriate for education at this time  Skin:  Skin Assessment: Reviewed RN Assessment  Last BM:  10/13/2023, type 1-medium  Height:   Ht Readings from Last 1 Encounters:  10/17/23 5' (1.524 m)    Weight:   Wt Readings from Last 1 Encounters:  10/19/23 54.7 kg    Ideal Body Weight:  45.5 kg  BMI:  Body mass index is 23.55 kg/m.  Estimated Nutritional Needs:   Kcal:  1300-1500 kcal/d  Protein:  65-80 g/d  Fluid:  1.5L/d  Idriss Quackenbush P., RD, LDN, CNSC See AMiON for contact information

## 2023-10-19 NOTE — Progress Notes (Signed)
Palliative:  PMT continues to follow along. Briefly discussed situation with Dr. Chestine Spore. We will plan to reach out to family tomorrow 1/17 for ongoing GOC discussions.   Gerlean Ren, DNP, AGNP-C Palliative Medicine Team Team Phone # [phone]  Pager # [phone]   NO CHARGE

## 2023-10-20 DIAGNOSIS — J449 Chronic obstructive pulmonary disease, unspecified: Secondary | ICD-10-CM

## 2023-10-20 DIAGNOSIS — J9602 Acute respiratory failure with hypercapnia: Secondary | ICD-10-CM

## 2023-10-20 DIAGNOSIS — D649 Anemia, unspecified: Secondary | ICD-10-CM

## 2023-10-20 DIAGNOSIS — I10 Essential (primary) hypertension: Secondary | ICD-10-CM

## 2023-10-20 DIAGNOSIS — J9601 Acute respiratory failure with hypoxia: Secondary | ICD-10-CM

## 2023-10-20 DIAGNOSIS — J189 Pneumonia, unspecified organism: Secondary | ICD-10-CM | POA: Diagnosis not present

## 2023-10-20 DIAGNOSIS — R339 Retention of urine, unspecified: Secondary | ICD-10-CM

## 2023-10-20 DIAGNOSIS — R739 Hyperglycemia, unspecified: Secondary | ICD-10-CM

## 2023-10-20 DIAGNOSIS — E43 Unspecified severe protein-calorie malnutrition: Secondary | ICD-10-CM

## 2023-10-20 LAB — GLUCOSE, CAPILLARY
Glucose-Capillary: 106 mg/dL — ABNORMAL HIGH (ref 70–99)
Glucose-Capillary: 130 mg/dL — ABNORMAL HIGH (ref 70–99)
Glucose-Capillary: 131 mg/dL — ABNORMAL HIGH (ref 70–99)
Glucose-Capillary: 134 mg/dL — ABNORMAL HIGH (ref 70–99)
Glucose-Capillary: 151 mg/dL — ABNORMAL HIGH (ref 70–99)

## 2023-10-20 MED ORDER — METHYLPREDNISOLONE SODIUM SUCC 40 MG IJ SOLR
20.0000 mg | Freq: Every day | INTRAMUSCULAR | Status: DC
  Administered 2023-10-21 – 2023-10-25 (×5): 20 mg via INTRAVENOUS
  Filled 2023-10-20 (×5): qty 1

## 2023-10-20 NOTE — Plan of Care (Signed)
  Problem: Clinical Measurements: Goal: Respiratory complications will improve Outcome: Not Progressing Goal: Cardiovascular complication will be avoided Outcome: Progressing   Problem: Clinical Measurements: Goal: Cardiovascular complication will be avoided Outcome: Progressing   Problem: Activity: Goal: Risk for activity intolerance will decrease Outcome: Not Progressing   Problem: Nutrition: Goal: Adequate nutrition will be maintained Outcome: Progressing   Problem: Elimination: Goal: Will not experience complications related to urinary retention Outcome: Not Progressing   Problem: Pain Management: Goal: General experience of comfort will improve Outcome: Progressing

## 2023-10-20 NOTE — Progress Notes (Addendum)
Patient ID: Carla Rivera, female   DOB: 1956-03-20, 68 y.o.   MRN: [id number]    Progress Note from the Palliative Medicine Team at [facility]   Patient Name: Carla Rivera        Date: 10/20/2023 DOB: 1955-11-03  Age: 68 y.o. 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 Meredeth Ide.   Admitted  through  med center her O2 sats were in the high 70s which did improve when oxygen was increased to 6 L to 95%.   There was concern for COPD exacerbation and she was treated with the usual modalities including steroids, nebulized bronchodilators, magnesium, and antibiotics.   Chest radiograph concerning for pneumonia. Despite the aforementioned treatment her condition continued to worsen as did her oxygen requirement as well.   Ultimately she required BiPAP which she was unfortunately able to tolerate. She was transferred  to [facility] /admit to ICU.   1/7 Admitted with resp failure, intubated, PNA, shock 1/9 failed wean completed 3d azithro  1/10 failed wean again  1/12 Needed increased sedation late shift yesterday and overnight. Some associated hypotension, now off pressors again  1/13 no acute events overnight, remains on low-dose fentanyl, intubated 1/14 unable to successfully wean from vent, further respiratory decompensation over the past 12 hours 1/17 tolerated SBT this a.m. 5/12 for 1 hour prior to tachycardia and increased work of breathing  Son faces ongoing decisions regarding treatment options, advanced directives   Subjective  Extensive chart review has been completed prior to meeting with patient/family  including labs, vital signs, imaging,  progress/consult notes, orders, medications and available advance directive documents.    This NP assessed patient at the bedside as a follow up for palliative medicine needs and emotional support.  No family at bedside.  Today patient is awake during my visit with her though she does remain on continuous infusions of fentanyl and precedex. She is communicating with head nods and occasionally writes sentences. She is mostly focused on me helping her get comfortable when I try to engage in conversation - adjust pillows, clean mouth, adjust fan, etc.   I do attempt to explain situation to her such as her ongoing need for ventilator and difficulty freeing her from it. We discuss ongoing conversations about her care moving forward. She has no input when I talk about this. I ask her if she has any questions or concerns and she shakes her head no. I ask her if she and her son have talked about these topics and then she asks to write but only writes "my sons are visiting later".   Call to son Carla Rivera. Updated him on my visit with her. He visited her earlier today but apparently she was not as awake at that time. He tells me his plans to tour [facility] on Monday. He tells me he clearly understands what his options are and just wants to make sure he fully understands each option so he can make the best decision for his mother. We discuss having one of our team members follow up with him early next week/after he is able to visit [facility] and he is agreeable to this. Son understands the seriousness, he remains hopeful for ongoing  treatment and improvements.  Questions and concerns addressed   Discussed with nursing staff  Plan: Ongoing goals of care discussions, at this point likely that no decisions will be made until patient's son visits [facility] on Monday - will request PMT provider follow up at that time  Time: 50 minutes    Detailed review of medical records ( labs, imaging, vital signs), medically appropriate  exam ( MS, skin, cardiac,  resp)   discussed with treatment team, counseling and education to patient, family, staff, documenting clinical information, medication management, coordination of care   Julissa Browning Andi Devon, DNP, [facility] Palliative Medicine Team Team Phone # [phone]  Pager # [phone]

## 2023-10-20 NOTE — Progress Notes (Signed)
MD Ardeth Perfect requested that we go ahead and max out the Precedex gtt.

## 2023-10-20 NOTE — Progress Notes (Signed)
NAME:  Carla Rivera, MRN:  [id number], DOB:  Mar 12, 1956, LOS: 10 ADMISSION DATE:  10/09/2023, CONSULTATION DATE:  1/7 REFERRING MD:  Dr. 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 clinic in [place] by Dr. Meredeth Ide.  Her home regimen is composed of Symbicort and scheduled DuoNebs.  She was in her usual state of health until approximately 1/4 when her baseline dyspnea worsened gradually causing her to present to med [facility] on 1/6 with these complaints.  She has been around grandchildren who have had cold symptoms recently.  Upon arrival to the med center her O2 sats were in the high 70s which did improve when oxygen was increased to 6 L to 95%.  There was concern for COPD exacerbation and she was treated with the usual modalities including steroids, nebulized bronchodilators, magnesium, and antibiotics.  Chest radiograph concerning for pneumonia.  Despite the aforementioned treatment her condition continued to worsen as did her oxygen requirement as well.  Ultimately she required BiPAP which she was unfortunately able to tolerate.  She was transferred ED to ED to [facility] where PCCM was asked to admit to ICU.  Pertinent  Medical History   has a past medical history of Arthritis, COPD (chronic obstructive pulmonary disease) (HCC), Depression, and HTN (hypertension).   Significant Hospital Events: Including procedures, antibiotic start and stop dates in addition to other pertinent events   1/7 Admitted with resp failure, intubated, PNA, shock 1/9 failed wean completed 3d azithro  1/10 failed wean again  1/12 Needed increased sedation late shift yesterday and overnight. Some associated hypotension, now off pressors again  1/13 no acute events overnight, remains on low-dose fentanyl, intubated 1/16 CVC not working 2/3  lumens. Tmax 100.4. 1/17 tolerated SBT this a.m. 5/12 for 1 hour prior to tachycardia and increased work of breathing  Interim History / Subjective:  Seen this a.m. on SBT with increased work of breathing.  Son at bedside and states he is going to tour [facility] today see if he thinks patient would want this level ongoing care\  Objective   Blood pressure (!) 133/50, pulse 83, temperature 97.7 F (36.5 C), temperature source Axillary, resp. rate 11, height 5' (1.524 m), weight 52 kg, SpO2 100%.    Vent Mode: CPAP;PSV FiO2 (%):  [40 %] 40 % Set Rate:  [18 bmp] 18 bmp Vt Set:  [380 mL] 380 mL PEEP:  [5 cmH20] 5 cmH20 Pressure Support:  [12 cmH20] 12 cmH20 Plateau Pressure:  [21 cmH20-24 cmH20] 23 cmH20   Intake/Output Summary (Last 24 hours) at 10/20/2023 0835 Last data filed at 10/20/2023 0800 Gross per 24 hour  Intake 2499.57 ml  Output 1450 ml  Net 1049.57 ml   Filed Weights   10/18/23 0500 10/19/23 0500 10/20/23 0354  Weight: 55.5 kg 54.7 kg 52 kg    Examination: General: Acute on chronic ill-appearing deconditioned frail middle-aged female lying in bed on mechanical ventilation in no acute distress HEENT: ETT, MM pink/moist, PERRL,  Neuro: Interactive on bed with lightening sedation CV: s1s2 regular rate and rhythm, no murmur, rubs, or gallops,  PULM: Mild tachypnea during SBT, increased work of breathing, tolerating ventilator GI: soft, bowel sounds active in all 4 quadrants, non-tender, non-distended, tolerating TF Extremities: warm/dry, no edema  Skin: no rashes or lesions  Resolved Hospital Problem list  Shock  Hypotension  Assessment & Plan:   Acute on chronic hypoxic and hypercarbic respiratory failure on 2 L nasal cannula at baseline AECOPD w end stage COPD, POA  Pneumococcal PNA, Rhinovirus and coronavirus OC43 infections -Completed 3 days of azithromycin -Empiric Zosyn started 1/16 P:  Continue to follow respiratory culture Remains on empiric  Zosyn Continue ventilator support with lung protective strategies  Wean PEEP and FiO2 for sats greater than 90%. Head of bed elevated 30 degrees. Plateau pressures less than 30 cm H20.  Follow intermittent chest x-ray and ABG.   SAT/SBT as tolerated, mentation preclude extubation  Ensure adequate pulmonary hygiene  Follow cultures  VAP bundle in place  PAD protocol Continue bronchodilators and steroids  Essential HTN ; hypotensive on sedation P: Home antihypertensives remain on hold Patient becomes hypertensive during SBT trials but hypotensive with sedation Peripheral low-dose vasopressors as needed for MAP goal greater than 65 If his son opts for ongoing aggressive care would benefit from PICC line placement  Anemia-- acute on chronic  P: Transfuse per protocol Hemoglobin goal greater than 7 Trend CBC  Mild hyperglycemia -Most recent A1c 5.8 P: Continue SSI  Severe protein calorie malnutrition  P: Continue tube feeds  Urinary retention P: Continue Bethanechol  Hypervolemia  -based on I's and O's, euvolemic on exam P: Continue to diurese as able Optimize electrolytes  GOC Ongoing goals of care discussion.  Spoke with son at bedside a.m. 11/17 he states today he is planning to tour [facility] to see if ongoing aggressive care including trach and long-term vent support is something his mother would want  Best Practice (right click and "Reselect all SmartList Selections" daily)   Diet/type: tubefeeds DVT prophylaxis prophylactic heparin  Pressure ulcer(s): N/A GI prophylaxis: PPI Lines: Central line Foley:  N/A Code Status:  full code Last date of multidisciplinary goals of care discussion: Son updated at bedside a.m. 1/17  CRITICAL CARE Performed by: Emin Foree D. Harris   Total critical care time: 38 minutes  Critical care time was exclusive of separately billable procedures and treating other patients.  Critical care was necessary to treat or prevent  imminent or life-threatening deterioration.  Critical care was time spent personally by me on the following activities: development of treatment plan with patient and/or surrogate as well as nursing, discussions with consultants, evaluation of patient's response to treatment, examination of patient, obtaining history from patient or surrogate, ordering and performing treatments and interventions, ordering and review of laboratory studies, ordering and review of radiographic studies, pulse oximetry and re-evaluation of patient's condition.   Kamyah Wilhelmsen D. Harris, NP-C [facility] Personal contact information can be found on Amion  If no contact or response made please call [phone] 10/20/2023, 8:37 AM

## 2023-10-21 DIAGNOSIS — J9601 Acute respiratory failure with hypoxia: Secondary | ICD-10-CM | POA: Diagnosis not present

## 2023-10-21 DIAGNOSIS — J9602 Acute respiratory failure with hypercapnia: Secondary | ICD-10-CM | POA: Diagnosis not present

## 2023-10-21 DIAGNOSIS — I10 Essential (primary) hypertension: Secondary | ICD-10-CM | POA: Diagnosis not present

## 2023-10-21 LAB — POCT I-STAT 7, (LYTES, BLD GAS, ICA,H+H)
Acid-Base Excess: 11 mmol/L — ABNORMAL HIGH (ref 0.0–2.0)
Bicarbonate: 39.8 mmol/L — ABNORMAL HIGH (ref 20.0–28.0)
Calcium, Ion: 1.28 mmol/L (ref 1.15–1.40)
HCT: 30 % — ABNORMAL LOW (ref 36.0–46.0)
Hemoglobin: 10.2 g/dL — ABNORMAL LOW (ref 12.0–15.0)
O2 Saturation: 82 %
Potassium: 4.4 mmol/L (ref 3.5–5.1)
Sodium: 133 mmol/L — ABNORMAL LOW (ref 135–145)
TCO2: 42 mmol/L — ABNORMAL HIGH (ref 22–32)
pCO2 arterial: 82.1 mm[Hg] (ref 32–48)
pH, Arterial: 7.294 — ABNORMAL LOW (ref 7.35–7.45)
pO2, Arterial: 55 mm[Hg] — ABNORMAL LOW (ref 83–108)

## 2023-10-21 LAB — GLUCOSE, CAPILLARY
Glucose-Capillary: 104 mg/dL — ABNORMAL HIGH (ref 70–99)
Glucose-Capillary: 107 mg/dL — ABNORMAL HIGH (ref 70–99)
Glucose-Capillary: 116 mg/dL — ABNORMAL HIGH (ref 70–99)
Glucose-Capillary: 139 mg/dL — ABNORMAL HIGH (ref 70–99)
Glucose-Capillary: 140 mg/dL — ABNORMAL HIGH (ref 70–99)
Glucose-Capillary: 161 mg/dL — ABNORMAL HIGH (ref 70–99)
Glucose-Capillary: 99 mg/dL (ref 70–99)

## 2023-10-21 LAB — CULTURE, RESPIRATORY W GRAM STAIN

## 2023-10-21 MED ORDER — MORPHINE SULFATE (PF) 2 MG/ML IV SOLN
2.0000 mg | INTRAVENOUS | Status: DC | PRN
  Administered 2023-10-21: 2 mg via INTRAVENOUS

## 2023-10-21 MED ORDER — MORPHINE SULFATE (PF) 2 MG/ML IV SOLN
INTRAVENOUS | Status: AC
  Filled 2023-10-21: qty 1

## 2023-10-21 NOTE — Progress Notes (Signed)
Alerted Dr. Ardeth Perfect CCM that patient was exerting a lot of effort weening and seemed very anxious.  MD ordered a q4h dose of 2 mg iv morphine to be given now.  Also he ordered that I turn precedexs gtt to 1.5.

## 2023-10-21 NOTE — Progress Notes (Signed)
eLink Physician-Brief Progress Note Patient Name: Carla Rivera DOB: [birth year] MRN: [id number]   Date of Service  10/21/2023  HPI/Events of Note  Urine retention  eICU Interventions     Urine retention ]despitye multiple intermittent cath  Foley ordered     Massie Maroon 10/21/2023, 2:35 AM

## 2023-10-21 NOTE — Progress Notes (Signed)
NAME:  Carla Rivera, MRN:  [id number], DOB:  04-20-1956, LOS: 11 ADMISSION DATE:  10/09/2023, CONSULTATION DATE:  1/7 REFERRING MD:  Dr. 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 Meredeth Ide.  Her home regimen is composed of Symbicort and scheduled DuoNebs.  She was in her usual state of health until approximately 1/4 when her baseline dyspnea worsened gradually causing her to present to med [facility] on 1/6 with these complaints.  She has been around grandchildren who have had cold symptoms recently.  Upon arrival to the med center her O2 sats were in the high 70s which did improve when oxygen was increased to 6 L to 95%.  There was concern for COPD exacerbation and she was treated with the usual modalities including steroids, nebulized bronchodilators, magnesium, and antibiotics.  Chest radiograph concerning for pneumonia.  Despite the aforementioned treatment her condition continued to worsen as did her oxygen requirement as well.  Ultimately she required BiPAP which she was unfortunately able to tolerate.  She was transferred ED to ED to [facility] where PCCM was asked to admit to ICU.  Pertinent  Medical History   has a past medical history of Arthritis, COPD (chronic obstructive pulmonary disease) (HCC), Depression, and HTN (hypertension).   Significant Hospital Events: Including procedures, antibiotic start and stop dates in addition to other pertinent events   1/7 Admitted with resp failure, intubated, PNA, shock 1/9 failed wean completed 3d azithro  1/10 failed wean again  1/12 Needed increased sedation late shift yesterday and overnight. Some associated hypotension, now off pressors again  1/13 no acute events overnight, remains on low-dose fentanyl, intubated 1/16 CVC not working 2/3  lumens. Tmax 100.4. 1/17 tolerated SBT this a.m. 5/12 for 1 hour prior to tachycardia and increased work of breathing  Interim History / Subjective:  Went back on PRVC overnight.  Patient is more awake.  On Precedex/profol/fentnayl for sedation.  Objective   Blood pressure (!) 134/45, pulse 88, temperature (!) 97.3 F (36.3 C), temperature source Axillary, resp. rate (!) 23, height 5' (1.524 m), weight 54.9 kg, SpO2 100%.    Vent Mode: PRVC FiO2 (%):  [40 %] 40 % Set Rate:  [18 bmp] 18 bmp Vt Set:  [380 mL] 380 mL PEEP:  [5 cmH20] 5 cmH20 Pressure Support:  [10 cmH20] 10 cmH20 Plateau Pressure:  [15 cmH20-21 cmH20] 19 cmH20   Intake/Output Summary (Last 24 hours) at 10/21/2023 1120 Last data filed at 10/21/2023 0800 Gross per 24 hour  Intake 1731.46 ml  Output 1270 ml  Net 461.46 ml   Filed Weights   10/19/23 0500 10/20/23 0354 10/21/23 0245  Weight: 54.7 kg 52 kg 54.9 kg    Examination: General: Acute on chronic ill-appearing deconditioned frail middle-aged female lying in bed on mechanical ventilation in no acute distress HEENT: ETT, MM pink/moist, PERRL,  Neuro: Interactive on bed with lightening sedation CV: s1s2 regular rate and rhythm, no murmur, rubs, or gallops,  PULM: Mild tachypnea during SBT, increased work of breathing, tolerating ventilator GI: soft, bowel sounds active in all 4 quadrants, non-tender, non-distended, tolerating TF Extremities: warm/dry, no edema  Skin: no rashes or lesions  Resolved Hospital Problem list   Shock  Hypotension  Assessment & Plan:   Acute on chronic hypoxic and hypercarbic respiratory failure  on 2 L nasal cannula at baseline AECOPD w end stage COPD, POA  Pneumococcal PNA, Rhinovirus and coronavirus OC43 infections -Completed 3 days of azithromycin -Empiric Zosyn started 1/16 P:  Continue to follow respiratory culture Remains on empiric Zosyn SBT/PSV trial today. Wean FIO2 to maintain SpO2 88-93%. Head of bed elevated 30  degrees. Plateau pressures less than 30 cm H20.  Follow intermittent chest x-ray and ABG.   Ensure adequate pulmonary hygiene  VAP bundle in place  PAD protocol Continue bronchodilators and steroids  Essential HTN ; hypotensive on sedation P: Home antihypertensives remain on hold Patient becomes hypertensive during SBT trials but hypotensive with sedation Peripheral low-dose vasopressors as needed for MAP goal greater than 65 If his son opts for ongoing aggressive care, may benefit from PICC line placement  Anemia-- acute on chronic  P: Transfuse per protocol Hemoglobin goal greater than 7 Trend CBC  Mild hyperglycemia -Most recent A1c 5.8 P: Continue SSI  Severe protein calorie malnutrition  P: Continue tube feeds  Urinary retention P: Continue Bethanechol  Hypervolemia  -based on I's and O's, euvolemic on exam P: Continue to diurese as able Optimize electrolytes  GOC Ongoing goals of care discussion.  Spoke with son at bedside a.m. 11/17 he states today he is planning to tour [facility] to see if ongoing aggressive care including trach and long-term vent support is something his mother would want  Best Practice (right click and "Reselect all SmartList Selections" daily)   Diet/type: tubefeeds DVT prophylaxis prophylactic heparin  Pressure ulcer(s): N/A GI prophylaxis: PPI Lines: Central line Foley:  N/A Code Status:  full code Last date of multidisciplinary goals of care discussion: Son updated at bedside a.m. 1/17  CRITICAL CARE Performed by: Marcelle Smiling   Total critical care time: 38 minutes  Critical care time was exclusive of separately billable procedures and treating other patients.  Critical care was necessary to treat or prevent imminent or life-threatening deterioration.  Critical care was time spent personally by me on the following activities: development of treatment plan with patient and/or surrogate as well as nursing, discussions with  consultants, evaluation of patient's response to treatment, examination of patient, obtaining history from patient or surrogate, ordering and performing treatments and interventions, ordering and review of laboratory studies, ordering and review of radiographic studies, pulse oximetry and re-evaluation of patient's condition.  Marcelle Smiling, MD Board Certified by the ABIM, Pulmonary Diseases & Critical Care Medicine  [facility] Personal contact information can be found on Amion  If no contact or response made please call [phone] 10/21/2023, 11:20 AM

## 2023-10-21 NOTE — Plan of Care (Signed)
  Problem: Clinical Measurements: Goal: Respiratory complications will improve Outcome: Not Progressing Goal: Cardiovascular complication will be avoided Outcome: Progressing   Problem: Clinical Measurements: Goal: Cardiovascular complication will be avoided Outcome: Progressing   Problem: Activity: Goal: Risk for activity intolerance will decrease Outcome: Not Progressing   Problem: Coping: Goal: Level of anxiety will decrease Outcome: Progressing   Problem: Elimination: Goal: Will not experience complications related to urinary retention Outcome: Not Progressing

## 2023-10-22 DIAGNOSIS — J9602 Acute respiratory failure with hypercapnia: Secondary | ICD-10-CM | POA: Diagnosis not present

## 2023-10-22 DIAGNOSIS — I10 Essential (primary) hypertension: Secondary | ICD-10-CM | POA: Diagnosis not present

## 2023-10-22 DIAGNOSIS — J9601 Acute respiratory failure with hypoxia: Secondary | ICD-10-CM | POA: Diagnosis not present

## 2023-10-22 LAB — POCT I-STAT 7, (LYTES, BLD GAS, ICA,H+H)
Acid-Base Excess: 11 mmol/L — ABNORMAL HIGH (ref 0.0–2.0)
Bicarbonate: 38.9 mmol/L — ABNORMAL HIGH (ref 20.0–28.0)
Calcium, Ion: 1.3 mmol/L (ref 1.15–1.40)
HCT: 27 % — ABNORMAL LOW (ref 36.0–46.0)
Hemoglobin: 9.2 g/dL — ABNORMAL LOW (ref 12.0–15.0)
O2 Saturation: 96 %
Patient temperature: 37
Potassium: 4 mmol/L (ref 3.5–5.1)
Sodium: 134 mmol/L — ABNORMAL LOW (ref 135–145)
TCO2: 41 mmol/L — ABNORMAL HIGH (ref 22–32)
pCO2 arterial: 72.3 mm[Hg] (ref 32–48)
pH, Arterial: 7.339 — ABNORMAL LOW (ref 7.35–7.45)
pO2, Arterial: 93 mm[Hg] (ref 83–108)

## 2023-10-22 LAB — GLUCOSE, CAPILLARY
Glucose-Capillary: 101 mg/dL — ABNORMAL HIGH (ref 70–99)
Glucose-Capillary: 113 mg/dL — ABNORMAL HIGH (ref 70–99)
Glucose-Capillary: 118 mg/dL — ABNORMAL HIGH (ref 70–99)
Glucose-Capillary: 126 mg/dL — ABNORMAL HIGH (ref 70–99)
Glucose-Capillary: 175 mg/dL — ABNORMAL HIGH (ref 70–99)
Glucose-Capillary: 86 mg/dL (ref 70–99)

## 2023-10-22 MED ORDER — PROPOFOL 1000 MG/100ML IV EMUL
5.0000 ug/kg/min | INTRAVENOUS | Status: DC
  Administered 2023-10-23 (×2): 20 ug/kg/min via INTRAVENOUS
  Filled 2023-10-22 (×2): qty 100

## 2023-10-22 MED ORDER — PROPOFOL 1000 MG/100ML IV EMUL
INTRAVENOUS | Status: AC
  Administered 2023-10-22: 20 ug/kg/min via INTRAVENOUS
  Filled 2023-10-22: qty 100

## 2023-10-22 NOTE — Progress Notes (Signed)
Vent changes to pressure control made by Dr. Ardeth Perfect. Patient is tolerating it so far. Per MD, for staff to keep with this vent settings and to adjust sedation to keep patient comfortable if needed over night.

## 2023-10-22 NOTE — Plan of Care (Signed)

## 2023-10-22 NOTE — Progress Notes (Signed)
PC increased from 12 to 18 due to increased sedation needs and low tidal volume. Patient tolerating well, no respiratory distress noted at this time.

## 2023-10-22 NOTE — Progress Notes (Signed)
NAME:  Carla Rivera, MRN:  [id number], DOB:  June 03, 1956, LOS: 12 ADMISSION DATE:  10/09/2023, CONSULTATION DATE:  1/7 REFERRING MD:  Dr. 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 Meredeth Ide.  Her home regimen is composed of Symbicort and scheduled DuoNebs.  She was in her usual state of health until approximately 1/4 when her baseline dyspnea worsened gradually causing her to present to med [facility] on 1/6 with these complaints.  She has been around grandchildren who have had cold symptoms recently.  Upon arrival to the med center her O2 sats were in the high 70s which did improve when oxygen was increased to 6 L to 95%.  There was concern for COPD exacerbation and she was treated with the usual modalities including steroids, nebulized bronchodilators, magnesium, and antibiotics.  Chest radiograph concerning for pneumonia.  Despite the aforementioned treatment her condition continued to worsen as did her oxygen requirement as well.  Ultimately she required BiPAP which she was unfortunately able to tolerate.  She was transferred ED to ED to [facility] where PCCM was asked to admit to ICU.  Pertinent  Medical History   has a past medical history of Arthritis, COPD (chronic obstructive pulmonary disease) (HCC), Depression, and HTN (hypertension).   Significant Hospital Events: Including procedures, antibiotic start and stop dates in addition to other pertinent events   1/7 Admitted with resp failure, intubated, PNA, shock 1/9 failed wean completed 3d azithro  1/10 failed wean again  1/12 Needed increased sedation late shift yesterday and overnight. Some associated hypotension, now off pressors again  1/13 no acute events overnight, remains on low-dose fentanyl, intubated 1/16 CVC not working 2/3  lumens. Tmax 100.4. 1/17 tolerated SBT this a.m. 5/12 for 1 hour prior to tachycardia and increased work of breathing  Interim History / Subjective:  Went back on PRVC overnight.  Patient is more awake.  On Precedex/profol/fentanyl for sedation.  Continues to have difficulties with tolerating PSV/SBT, but may be improving in terms of time until she fails.  Objective   Blood pressure 112/65, pulse 73, temperature 97.7 F (36.5 C), temperature source Axillary, resp. rate 15, height 5' (1.524 m), weight 54.9 kg, SpO2 99%.    Vent Mode: PRVC FiO2 (%):  [40 %-50 %] 40 % Set Rate:  [18 bmp] 18 bmp Vt Set:  [380 mL] 380 mL PEEP:  [5 cmH20] 5 cmH20 Plateau Pressure:  [15 cmH20-24 cmH20] 24 cmH20   Intake/Output Summary (Last 24 hours) at 10/22/2023 0805 Last data filed at 10/22/2023 0700 Gross per 24 hour  Intake 2613.6 ml  Output 1530 ml  Net 1083.6 ml   Filed Weights   10/19/23 0500 10/20/23 0354 10/21/23 0245  Weight: 54.7 kg 52 kg 54.9 kg    Examination: General: Acute on chronic ill-appearing deconditioned frail middle-aged female lying in bed on mechanical ventilation in no acute distress HEENT: ETT, MM pink/moist, PERRL,  Neuro: Interactive on bed with lightening sedation CV: s1s2 regular rate and rhythm, no murmur, rubs, or gallops,  PULM: Mild tachypnea during SBT, increased work of breathing, tolerating ventilator GI: soft, bowel sounds active in all 4 quadrants, non-tender, non-distended, tolerating TF Extremities: warm/dry, no edema  Skin: no rashes or lesions  Resolved Hospital Problem list   Shock  Hypotension  Assessment & Plan:  Acute on chronic hypoxic and hypercarbic respiratory failure on 2 L nasal cannula at baseline AECOPD w end stage COPD, POA  Pneumococcal PNA, Rhinovirus and coronavirus OC43 infections SBT/PSV trial today.  Will try to start SBT with patient at RASS -1 to -2, rather than waiting for her to be more alert and anxious prior to initiating  SBT.  Will anticipate extubation to BiPAP support with Precedex infusing. Respiratory culture from 1/15 did not isolate any pathogens. Remains on empiric Zosyn (start date 1/16).  Will plan to stop after 5 day course.  Got 3 day course of azithromycin. Wean FiO2 to maintain SpO2 88-93%. Head of bed elevated 30 degrees. Plateau pressures less than 30 cm H20.  Follow intermittent chest x-ray and ABG.   Pulmonary toilet.  VAP bundle in place . PAD protocol Continue bronchodilators and steroids.  Essential HTN Patient becomes hypertensive during SBT trials but hypotensive with sedation Home antihypertensives remain on hold Peripheral low-dose vasopressors as needed to maintain MAP 65+. If his son opts for ongoing aggressive care, may benefit from PICC line placement  Anemia, acute on chronic  P: Transfuse per protocol Hemoglobin goal greater than 7 Trend CBC  Mild hyperglycemia -Most recent A1c 5.8 P: Continue SSI  Severe protein calorie malnutrition  P: Continue tube feeds  Urinary retention P: Continue Bethanechol  Hypervolemia  -based on I's and O's, euvolemic on exam P: Continue to diurese as able Optimize electrolytes  GOC Ongoing goals of care discussion.  Spoke with son at bedside a.m. 11/17 he states today he is planning to tour [facility] to see if ongoing aggressive care including trach and long-term vent support is something his mother would want  Best Practice (right click and "Reselect all SmartList Selections" daily)   Diet/type: tubefeeds DVT prophylaxis prophylactic heparin  Pressure ulcer(s): N/A GI prophylaxis: PPI Lines: Central line Foley:  N/A Code Status:  full code Last date of multidisciplinary goals of care discussion: Son updated at bedside a.m. 1/17  CRITICAL CARE Performed by: Marcelle Smiling  Total critical care time: 30 minutes  Critical care time was exclusive of separately billable procedures and treating other  patients.  Critical care was necessary to treat or prevent imminent or life-threatening deterioration.  Critical care was time spent personally by me on the following activities: development of treatment plan with patient and/or surrogate as well as nursing, discussions with consultants, evaluation of patient's response to treatment, examination of patient, obtaining history from patient or surrogate, ordering and performing treatments and interventions, ordering and review of laboratory studies, ordering and review of radiographic studies, pulse oximetry and re-evaluation of patient's condition.  Marcelle Smiling, MD Board Certified by the ABIM, Pulmonary Diseases & Critical Care Medicine  [facility] Personal contact information can be found on Amion  If no contact or response made please call [phone] 10/22/2023, 9:34 AM

## 2023-10-22 NOTE — Progress Notes (Signed)
RN reports she returned pt to prior settings, as pt was not tolerating vent wean

## 2023-10-23 ENCOUNTER — Inpatient Hospital Stay (HOSPITAL_COMMUNITY): Payer: Medicare PPO

## 2023-10-23 DIAGNOSIS — I952 Hypotension due to drugs: Secondary | ICD-10-CM | POA: Diagnosis not present

## 2023-10-23 DIAGNOSIS — L8990 Pressure ulcer of unspecified site, unspecified stage: Secondary | ICD-10-CM | POA: Insufficient documentation

## 2023-10-23 DIAGNOSIS — J9602 Acute respiratory failure with hypercapnia: Secondary | ICD-10-CM | POA: Diagnosis not present

## 2023-10-23 DIAGNOSIS — J449 Chronic obstructive pulmonary disease, unspecified: Secondary | ICD-10-CM | POA: Diagnosis not present

## 2023-10-23 DIAGNOSIS — J9601 Acute respiratory failure with hypoxia: Secondary | ICD-10-CM | POA: Diagnosis not present

## 2023-10-23 LAB — CBC WITH DIFFERENTIAL/PLATELET
Abs Immature Granulocytes: 0.05 10*3/uL (ref 0.00–0.07)
Basophils Absolute: 0 10*3/uL (ref 0.0–0.1)
Basophils Relative: 0 %
Eosinophils Absolute: 0.2 10*3/uL (ref 0.0–0.5)
Eosinophils Relative: 2 %
HCT: 28.5 % — ABNORMAL LOW (ref 36.0–46.0)
Hemoglobin: 8.5 g/dL — ABNORMAL LOW (ref 12.0–15.0)
Immature Granulocytes: 1 %
Lymphocytes Relative: 8 %
Lymphs Abs: 0.6 10*3/uL — ABNORMAL LOW (ref 0.7–4.0)
MCH: 28 pg (ref 26.0–34.0)
MCHC: 29.8 g/dL — ABNORMAL LOW (ref 30.0–36.0)
MCV: 93.8 fL (ref 80.0–100.0)
Monocytes Absolute: 0.8 10*3/uL (ref 0.1–1.0)
Monocytes Relative: 10 %
Neutro Abs: 6.3 10*3/uL (ref 1.7–7.7)
Neutrophils Relative %: 79 %
Platelets: 332 10*3/uL (ref 150–400)
RBC: 3.04 MIL/uL — ABNORMAL LOW (ref 3.87–5.11)
RDW: 15.2 % (ref 11.5–15.5)
WBC: 8 10*3/uL (ref 4.0–10.5)
nRBC: 0 % (ref 0.0–0.2)

## 2023-10-23 LAB — GLUCOSE, CAPILLARY
Glucose-Capillary: 111 mg/dL — ABNORMAL HIGH (ref 70–99)
Glucose-Capillary: 117 mg/dL — ABNORMAL HIGH (ref 70–99)
Glucose-Capillary: 137 mg/dL — ABNORMAL HIGH (ref 70–99)
Glucose-Capillary: 140 mg/dL — ABNORMAL HIGH (ref 70–99)
Glucose-Capillary: 181 mg/dL — ABNORMAL HIGH (ref 70–99)
Glucose-Capillary: 181 mg/dL — ABNORMAL HIGH (ref 70–99)

## 2023-10-23 LAB — POCT I-STAT 7, (LYTES, BLD GAS, ICA,H+H)
Acid-Base Excess: 11 mmol/L — ABNORMAL HIGH (ref 0.0–2.0)
Bicarbonate: 36.5 mmol/L — ABNORMAL HIGH (ref 20.0–28.0)
Calcium, Ion: 1.24 mmol/L (ref 1.15–1.40)
HCT: 24 % — ABNORMAL LOW (ref 36.0–46.0)
Hemoglobin: 8.2 g/dL — ABNORMAL LOW (ref 12.0–15.0)
O2 Saturation: 99 %
Patient temperature: 96.9
Potassium: 3.5 mmol/L (ref 3.5–5.1)
Sodium: 135 mmol/L (ref 135–145)
TCO2: 38 mmol/L — ABNORMAL HIGH (ref 22–32)
pCO2 arterial: 52.2 mm[Hg] — ABNORMAL HIGH (ref 32–48)
pH, Arterial: 7.449 (ref 7.35–7.45)
pO2, Arterial: 126 mm[Hg] — ABNORMAL HIGH (ref 83–108)

## 2023-10-23 LAB — BASIC METABOLIC PANEL
Anion gap: 8 (ref 5–15)
BUN: 12 mg/dL (ref 8–23)
CO2: 32 mmol/L (ref 22–32)
Calcium: 8.9 mg/dL (ref 8.9–10.3)
Chloride: 99 mmol/L (ref 98–111)
Creatinine, Ser: 0.59 mg/dL (ref 0.44–1.00)
GFR, Estimated: >60 mL/min (ref >60)
Glucose, Bld: 118 mg/dL — ABNORMAL HIGH (ref 70–99)
Potassium: 4.4 mmol/L (ref 3.5–5.1)
Sodium: 139 mmol/L (ref 135–145)

## 2023-10-23 LAB — TRIGLYCERIDES: Triglycerides: 164 mg/dL — ABNORMAL HIGH (ref <150)

## 2023-10-23 MED ORDER — HYDROXYZINE HCL 10 MG/5ML PO SYRP
25.0000 mg | ORAL_SOLUTION | Freq: Three times a day (TID) | ORAL | Status: DC | PRN
  Administered 2023-10-23 – 2023-10-26 (×2): 25 mg
  Filled 2023-10-23 (×3): qty 15

## 2023-10-23 MED ORDER — NOREPINEPHRINE 4 MG/250ML-% IV SOLN
0.0000 ug/min | INTRAVENOUS | Status: DC
  Administered 2023-10-25: 3 ug/min via INTRAVENOUS
  Administered 2023-10-28: 8 ug/min via INTRAVENOUS
  Administered 2023-10-28: 2 ug/min via INTRAVENOUS
  Administered 2023-10-29 – 2023-10-30 (×2): 3 ug/min via INTRAVENOUS
  Filled 2023-10-23 (×5): qty 250

## 2023-10-23 MED ORDER — MIDAZOLAM HCL 2 MG/2ML IJ SOLN
1.0000 mg | INTRAMUSCULAR | Status: DC | PRN
  Administered 2023-10-23 – 2023-10-29 (×3): 2 mg via INTRAVENOUS
  Filled 2023-10-23 (×2): qty 2

## 2023-10-23 MED ORDER — CLONAZEPAM 1 MG PO TABS
1.0000 mg | ORAL_TABLET | Freq: Three times a day (TID) | ORAL | Status: DC | PRN
  Administered 2023-10-23 – 2023-10-30 (×10): 1 mg
  Filled 2023-10-23 (×11): qty 1

## 2023-10-23 MED ORDER — SODIUM CHLORIDE 0.9 % IV BOLUS
500.0000 mL | Freq: Once | INTRAVENOUS | Status: AC
  Administered 2023-10-23: 500 mL via INTRAVENOUS

## 2023-10-23 MED ORDER — BETHANECHOL CHLORIDE 25 MG PO TABS
25.0000 mg | ORAL_TABLET | Freq: Three times a day (TID) | ORAL | Status: DC
Start: 2023-10-23 — End: 2023-10-30
  Administered 2023-10-23 – 2023-10-30 (×22): 25 mg
  Filled 2023-10-23 (×22): qty 1

## 2023-10-23 MED ORDER — MIDAZOLAM HCL 2 MG/2ML IJ SOLN
INTRAMUSCULAR | Status: AC
  Filled 2023-10-23: qty 2

## 2023-10-23 NOTE — Progress Notes (Signed)
Increased agitation, worse vent compliance throughout the day with associated desats.  Added PRN cloanzepam and atarax, hasn't had sustained benefit. Will add PRN versed   Tessie Fass MSN, AGACNP-BC [facility] Pulmonary/Critical Care Medicine 10/23/2023, 5:43 PM

## 2023-10-23 NOTE — Progress Notes (Addendum)
eLink Physician-Brief Progress Note Patient Name: Carla Rivera DOB: 1955-12-18 MRN: [id number]   Date of Service  10/23/2023  HPI/Events of Note  RN askibng for levo to be changed for c-line dosing, FYI, RN noted pink tinged urine, will keep an eye on at this time  eICU Interventions  Ordered If urine color hemat urea or so to call back.      Intervention Category Minor Interventions: Other:;Routine modifications to care plan (e.g. PRN medications for pain, fever)  Ranee Gosselin 10/23/2023, 8:38 PM  23:28 Decreased urinary output. Only 70cc of urine since the beginning of the shift.  [place] Foley in the 40's. Prop off and precedex 0.7  Camera eval done: On PSV. MAP > 65, HR 56.  EF 55% Cr normal Saline bolus over one hour 500 ml.

## 2023-10-23 NOTE — Progress Notes (Signed)
Spoke w son Asher Muir at the bedside before he planned to leave for [facility] tour. Answered some of the questions he had about trach, possible long term trach/vent, and about comfort care.   He will be touring [facility] to try to get a feel for how it may or may not align with his mom's QOL goals.   All questions answered, emotional support provided. Understandably, all are having a very hard time with this.   Tessie Fass MSN, AGACNP-BC [facility] Pulmonary/Critical Care Medicine 10/23/2023, 1:23 PM

## 2023-10-23 NOTE — Plan of Care (Signed)
  Problem: Clinical Measurements: Goal: Will remain free from infection Outcome: Progressing Goal: Diagnostic test results will improve Outcome: Progressing Goal: Cardiovascular complication will be avoided Outcome: Progressing   Problem: Activity: Goal: Risk for activity intolerance will decrease Outcome: Progressing   Problem: Nutrition: Goal: Adequate nutrition will be maintained Outcome: Progressing   Problem: Coping: Goal: Level of anxiety will decrease Outcome: Progressing   Problem: Clinical Measurements: Goal: Respiratory complications will improve Outcome: Not Progressing

## 2023-10-23 NOTE — Progress Notes (Signed)
NAME:  Carla Rivera, MRN:  [id number], DOB:  04-18-1956, LOS: 13 ADMISSION DATE:  10/09/2023, CONSULTATION DATE:  1/7 REFERRING MD:  Dr. 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 Meredeth Ide.  Her home regimen is composed of Symbicort and scheduled DuoNebs.  She was in her usual state of health until approximately 1/4 when her baseline dyspnea worsened gradually causing her to present to med [facility] on 1/6 with these complaints.  She has been around grandchildren who have had cold symptoms recently.  Upon arrival to the med center her O2 sats were in the high 70s which did improve when oxygen was increased to 6 L to 95%.  There was concern for COPD exacerbation and she was treated with the usual modalities including steroids, nebulized bronchodilators, magnesium, and antibiotics.  Chest radiograph concerning for pneumonia.  Despite the aforementioned treatment her condition continued to worsen as did her oxygen requirement as well.  Ultimately she required BiPAP which she was unfortunately able to tolerate.  She was transferred ED to ED to [facility] where PCCM was asked to admit to ICU.  Pertinent  Medical History   has a past medical history of Arthritis, COPD (chronic obstructive pulmonary disease) (HCC), Depression, and HTN (hypertension).   Significant Hospital Events: Including procedures, antibiotic start and stop dates in addition to other pertinent events   1/7 Admitted with resp failure, intubated, PNA, shock 1/9 failed wean completed 3d azithro  1/10 failed wean again  1/12 Needed increased sedation late shift yesterday and overnight. Some associated hypotension, now off pressors again  1/13 no acute events overnight, remains on low-dose fentanyl, intubated 1/16 CVC not working 2/3  lumens. Tmax 100.4. 1/17 tolerated SBT this a.m. 5/12 for 1 hour prior to tachycardia and increased work of breathing  Interim History / Subjective:  NAEO  On incr sadation and pressors   Objective   Blood pressure (!) 122/55, pulse 84, temperature 97.8 F (36.6 C), temperature source Axillary, resp. rate (!) 22, height 5' (1.524 m), weight 54.2 kg, SpO2 98%.    Vent Mode: PCV FiO2 (%):  [40 %] 40 % Set Rate:  [18 bmp] 18 bmp Vt Set:  [380 mL] 380 mL PEEP:  [5 cmH20] 5 cmH20 Pressure Support:  [16 cmH20] 16 cmH20 Plateau Pressure:  [16 cmH20-18 cmH20] 16 cmH20   Intake/Output Summary (Last 24 hours) at 10/23/2023 4098 Last data filed at 10/23/2023 0700 Gross per 24 hour  Intake 2133.27 ml  Output 1535 ml  Net 598.27 ml   Filed Weights   10/20/23 0354 10/21/23 0245 10/23/23 0500  Weight: 52 kg 54.9 kg 54.2 kg    Examination: General: Acute on chronic ill-appearing deconditioned frail middle-aged female lying in bed on mechanical ventilation in no acute distress HEENT: ETT, MM pink/moist, PERRL,  Neuro: Interactive on bed with lightening sedation CV: s1s2 regular rate and rhythm, no murmur, rubs, or gallops,  PULM: Mild tachypnea during SBT, increased work of breathing, tolerating ventilator GI: soft, bowel sounds active in all 4 quadrants, non-tender, non-distended, tolerating TF Extremities: warm/dry, no edema  Skin: no rashes or lesions  Resolved Hospital Problem list   Shock  Hypotension  Assessment & Plan:   AoC hypoxic and hypercarbic resp failure AECOPD, underlying end stage COPD  Pneumococcal PNA POA Rhinovirus  and Coronavirus OC43 infection, POA -completed 5d rocephin 3 day azithro starting at admission. -was started on zosyn 1/16 -- no pathogen isolated from trach asp 1/15 P -complete 5d course of zosyn  -cont vent weaning efforts, unfortunately after 13 days we are still not seeing much forward progress which speaks to the severity of her underlying lung  dz. Has needed incr amounts of sedation for lung compliance  -continues on steroids  -cont BD  -GOC as below   Sedation related hypotension Hx HTN P -holding home antihypertensives -NE for MAP > 65  -wean sedation as able  Hyperglycemia  P: -SSI   AoC anemia P -PRN CBC   Severe protein calorie malnutrition  P: -EN per RDN   Intermittent urinary retention  P: -incr urecholine 1/20   Hypervolemia  -based on I's and O's, euvolemic on exam P: Continue to diurese as able Optimize electrolytes  GOC -ongoing GOC -son planning to tour LTAC to help in decision making regarding if his mom would want trach. Family needs to understand plausibility of long-term vent  -of note, patient has repeatedly said she would not want trach. Difficult spot given the amount of sedation she is currently on.  -If trach/vent is not pursued, would advocate for 1-way extubation, with transition to comfort if resp status deteriorates  -Palliative care is following. I do not anticipate we will have much by way of decision making progress until after son tours facilities.   Best Practice (right click and "Reselect all SmartList Selections" daily)   Diet/type: tubefeeds DVT prophylaxis prophylactic heparin  Pressure ulcer(s): N/A GI prophylaxis: PPI Lines: Central line Foley:  N/A Code Status:  full code Last date of multidisciplinary goals of care discussion: Son updated at bedside a.m. 1/17  CRITICAL CARE Performed by: Lanier Clam   Total critical care time: 38 minutes  Critical care time was exclusive of separately billable procedures and treating other patients. Critical care was necessary to treat or prevent imminent or life-threatening deterioration.  Critical care was time spent personally by me on the following activities: development of treatment plan with patient and/or surrogate as well as nursing, discussions with consultants, evaluation of patient's response to treatment,  examination of patient, obtaining history from patient or surrogate, ordering and performing treatments and interventions, ordering and review of laboratory studies, ordering and review of radiographic studies, pulse oximetry and re-evaluation of patient's condition.  Tessie Fass MSN, AGACNP-BC [place] [facility] [place] for pager  10/23/2023, 9:39 AM

## 2023-10-24 DIAGNOSIS — R339 Retention of urine, unspecified: Secondary | ICD-10-CM | POA: Diagnosis not present

## 2023-10-24 DIAGNOSIS — Z7189 Other specified counseling: Secondary | ICD-10-CM | POA: Diagnosis not present

## 2023-10-24 DIAGNOSIS — J441 Chronic obstructive pulmonary disease with (acute) exacerbation: Secondary | ICD-10-CM | POA: Diagnosis not present

## 2023-10-24 DIAGNOSIS — J9621 Acute and chronic respiratory failure with hypoxia: Secondary | ICD-10-CM | POA: Diagnosis not present

## 2023-10-24 DIAGNOSIS — Z515 Encounter for palliative care: Secondary | ICD-10-CM

## 2023-10-24 DIAGNOSIS — J9622 Acute and chronic respiratory failure with hypercapnia: Secondary | ICD-10-CM | POA: Diagnosis not present

## 2023-10-24 DIAGNOSIS — J449 Chronic obstructive pulmonary disease, unspecified: Secondary | ICD-10-CM | POA: Diagnosis not present

## 2023-10-24 LAB — CBC WITH DIFFERENTIAL/PLATELET
Abs Immature Granulocytes: 0.03 10*3/uL (ref 0.00–0.07)
Basophils Absolute: 0 10*3/uL (ref 0.0–0.1)
Basophils Relative: 0 %
Eosinophils Absolute: 0 10*3/uL (ref 0.0–0.5)
Eosinophils Relative: 1 %
HCT: 27.6 % — ABNORMAL LOW (ref 36.0–46.0)
Hemoglobin: 8.1 g/dL — ABNORMAL LOW (ref 12.0–15.0)
Immature Granulocytes: 0 %
Lymphocytes Relative: 9 %
Lymphs Abs: 0.8 10*3/uL (ref 0.7–4.0)
MCH: 28.5 pg (ref 26.0–34.0)
MCHC: 29.3 g/dL — ABNORMAL LOW (ref 30.0–36.0)
MCV: 97.2 fL (ref 80.0–100.0)
Monocytes Absolute: 0.8 10*3/uL (ref 0.1–1.0)
Monocytes Relative: 9 %
Neutro Abs: 6.7 10*3/uL (ref 1.7–7.7)
Neutrophils Relative %: 81 %
Platelets: 317 10*3/uL (ref 150–400)
RBC: 2.84 MIL/uL — ABNORMAL LOW (ref 3.87–5.11)
RDW: 14.9 % (ref 11.5–15.5)
WBC: 8.4 10*3/uL (ref 4.0–10.5)
nRBC: 0 % (ref 0.0–0.2)

## 2023-10-24 LAB — GLUCOSE, CAPILLARY
Glucose-Capillary: 96 mg/dL (ref 70–99)
Glucose-Capillary: 83 mg/dL (ref 70–99)
Glucose-Capillary: 146 mg/dL — ABNORMAL HIGH (ref 70–99)
Glucose-Capillary: 121 mg/dL — ABNORMAL HIGH (ref 70–99)
Glucose-Capillary: 106 mg/dL — ABNORMAL HIGH (ref 70–99)

## 2023-10-24 LAB — URINALYSIS, COMPLETE (UACMP) WITH MICROSCOPIC
Bilirubin Urine: NEGATIVE
Glucose, UA: NEGATIVE mg/dL
Ketones, ur: NEGATIVE mg/dL
Nitrite: NEGATIVE
Protein, ur: 100 mg/dL — AB
RBC / HPF: >50 RBC/hpf (ref 0–5)
Specific Gravity, Urine: 1.027 (ref 1.005–1.030)
pH: 5 (ref 5.0–8.0)

## 2023-10-24 LAB — BASIC METABOLIC PANEL
Anion gap: 7 (ref 5–15)
BUN: 13 mg/dL (ref 8–23)
CO2: 33 mmol/L — ABNORMAL HIGH (ref 22–32)
Calcium: 8.7 mg/dL — ABNORMAL LOW (ref 8.9–10.3)
Chloride: 98 mmol/L (ref 98–111)
Creatinine, Ser: 0.51 mg/dL (ref 0.44–1.00)
GFR, Estimated: >60 mL/min (ref >60)
Glucose, Bld: 136 mg/dL — ABNORMAL HIGH (ref 70–99)
Potassium: 3.9 mmol/L (ref 3.5–5.1)
Sodium: 138 mmol/L (ref 135–145)

## 2023-10-24 MED ORDER — FAMOTIDINE 40 MG/5ML PO SUSR
20.0000 mg | Freq: Two times a day (BID) | ORAL | Status: DC
  Administered 2023-10-24 – 2023-10-29 (×12): 20 mg
  Filled 2023-10-24 (×13): qty 2.5

## 2023-10-24 MED ORDER — SODIUM CHLORIDE 0.9 % IV SOLN: INTRAVENOUS | Status: AC

## 2023-10-24 MED ORDER — FUROSEMIDE 10 MG/ML IJ SOLN
40.0000 mg | Freq: Once | INTRAMUSCULAR | Status: AC
  Administered 2023-10-24: 40 mg via INTRAVENOUS
  Filled 2023-10-24: qty 4

## 2023-10-24 MED ORDER — GUAIFENESIN 100 MG/5ML PO LIQD
15.0000 mL | Freq: Four times a day (QID) | ORAL | Status: DC
  Administered 2023-10-24 – 2023-10-30 (×24): 15 mL
  Filled 2023-10-24 (×24): qty 15

## 2023-10-24 NOTE — Progress Notes (Signed)
Patient ID: Keva Strid, female   DOB: 03/08/1956, 68 y.o.   MRN: [id number]    Progress Note from the Palliative Medicine Team at [facility]   Patient Name: Mylea Pendelton        Date: 10/24/2023 DOB: 12/11/1955  Age: 68 y.o. 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 Meredeth Ide.   Admitted  through  med center her O2 sats were in the high 70s which did improve when oxygen was increased to 6 L to 95%.   There was concern for COPD exacerbation and she was treated with the usual modalities including steroids, nebulized bronchodilators, magnesium, and antibiotics.   Chest radiograph concerning for pneumonia. Despite the aforementioned treatment her condition continued to worsen as did her oxygen requirement as well.   Ultimately she required BiPAP which she was unfortunately able to tolerate. She was transferred  to [facility] /admit to ICU.   1/7 Admitted with resp failure, intubated, PNA, shock 1/9 failed wean completed 3d azithro  1/10 failed wean again  1/12 Needed increased sedation late shift yesterday and overnight. Some associated hypotension, now off pressors again  1/13 no acute events overnight, remains on low-dose fentanyl, intubated 1/14 unable to successfully wean from vent, further respiratory decompensation over the past 12 hours 1/16 CVC not working 2/3 lumens. Tmax 100.4. 1/17 tolerated SBT this a.m. 5/12 for 1 hour prior to tachycardia and increased work of breathing 1/20 incr sedation needs. Failed SBT 1/21 incr sedation needs again. More delirious   Son faces ongoing decisions regarding treatment options, advanced directives    Subjective  Extensive chart review has been completed prior to meeting with patient/family  including labs, vital signs, imaging, progress/consult notes, orders, medications and available advance directive documents.    This NP assessed patient at the bedside as a follow up for palliative medicine needs and emotional support.  No family at bedside.  Patient has declined since I met with her yesterday, she is unable to communicate  today.  She remains vent dependant and requiring pressors for BP support.  Concern for further decompensation.  I spoke to her son/Jamie by telephone. Ongoing education regarding changes over the past 24 hours and  concerns of treatment team.  Son understands the seriousness, he remains hopeful for ongoing treatment and improvements.  He realizes patient's high risk for further decompensation, he needs time for outcomes.    Encouraged him to consider his mother's values, and quality of life in decision making  process.       Education offered today regarding  the importance of continued conversation with the medical providers regarding overall plan of care and treatment options,  ensuring decisions are within the context of the patients values and GOCs.  Questions and concerns addressed   Discussed with primary team and nursing staff   Time: 50 minutes    Detailed review of medical records ( labs, imaging, vital signs), medically appropriate exam ( MS, skin, cardiac,  resp)   discussed with treatment team, counseling and education to patient, family, staff, documenting clinical information, medication management, coordination of care    Lorinda Creed NP  Palliative Medicine Team Team Phone # [phone] Pager [phone]

## 2023-10-24 NOTE — Progress Notes (Signed)
NAME:  Carla Rivera, MRN:  [id number], DOB:  22-Sep-1956, LOS: 14 ADMISSION DATE:  10/09/2023, CONSULTATION DATE:  1/7 REFERRING MD:  Dr. 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 Meredeth Ide.  Her home regimen is composed of Symbicort and scheduled DuoNebs.  She was in her usual state of health until approximately 1/4 when her baseline dyspnea worsened gradually causing her to present to med [facility] on 1/6 with these complaints.  She has been around grandchildren who have had cold symptoms recently.  Upon arrival to the med center her O2 sats were in the high 70s which did improve when oxygen was increased to 6 L to 95%.  There was concern for COPD exacerbation and she was treated with the usual modalities including steroids, nebulized bronchodilators, magnesium, and antibiotics.  Chest radiograph concerning for pneumonia.  Despite the aforementioned treatment her condition continued to worsen as did her oxygen requirement as well.  Ultimately she required BiPAP which she was unfortunately able to tolerate.  She was transferred ED to ED to [facility] where PCCM was asked to admit to ICU.  Pertinent  Medical History   has a past medical history of Arthritis, COPD (chronic obstructive pulmonary disease) (HCC), Depression, and HTN (hypertension).   Significant Hospital Events: Including procedures, antibiotic start and stop dates in addition to other pertinent events   1/7 Admitted with resp failure, intubated, PNA, shock 1/9 failed wean completed 3d azithro  1/10 failed wean again  1/12 Needed increased sedation late shift yesterday and overnight. Some associated hypotension, now off pressors again  1/13 no acute events overnight, remains on low-dose fentanyl, intubated. Almost self- extubated   1/16 CVC not working 2/3 lumens. Tmax 100.4. 1/17 tolerated SBT this a.m. 5/12 for 1 hour prior to tachycardia and increased work of breathing 1/20 incr sedation needs. Failed SBT 1/21 incr sedation needs again. More delirious   Interim History / Subjective:  She looks bad this morning. Not getting consistent volumes. Some stacking.  Less coherent when she writes   Objective   Blood pressure (!) 131/59, pulse (!) 110, temperature 98.5 F (36.9 C), temperature source Axillary, resp. rate (!) 21, height 5' (1.524 m), weight 52 kg, SpO2 99%.    Vent Mode: PCV FiO2 (%):  [30 %-40 %] 30 % Set Rate:  [18 bmp] 18 bmp PEEP:  [5 cmH20] 5 cmH20 Plateau Pressure:  [15 cmH20-29 cmH20] 29 cmH20   Intake/Output Summary (Last 24 hours) at 10/24/2023 0923 Last data filed at 10/24/2023 4098 Gross per 24 hour  Intake 3099.21 ml  Output 1945 ml  Net 1154.21 ml   Filed Weights   10/21/23 0245 10/23/23 0500 10/24/23 0500  Weight: 54.9 kg 54.2 kg 52 kg    Examination: General: AoC ill appearing, frail, elderly appearing F intubated and uncomfortable appearing  Neuro: She is drowsy. Follows commands with repeated prompts. Trying to write but much less coherent than prior. On continuous sedation  HEENT: NCAT ETT secure anicteric sclera  CV: rrr s1s2 no rgm  PULM:  Tachypnea on full support, stacking, high peaks. Nasal flaring, accessory muscle use. Poor air movement  GI: soft ndnt  Extremities: dependent edema  Skin: c/d/w   Resolved Hospital Problem list   Shock  Hypotension  Assessment & Plan:   AoC  hypoxic and hypercarbic resp failure AECOPD, underlying end stage COPD Pneumococcal PNA, Rhinovirus and Coronavirus OC43 infections, POA  -completed 5d rocephin 3 day azithro starting at admission. -was started on zosyn 1/16 -- no pathogen isolated from trach asp 1/15-- and has completed 5d course of this as well  P -continues on solumedrol, triple therapy   -she is requiring increasing  amounts of sedation for vent compliance-- has periods of stacking, trapping, high peaks, and desats  -has not been able to make significant progress with SBTs in 2weeks.  -goals of care are ongoing   Sedation related hypotension Hx  HTN  P -holding home antihyeprtensives -PRN NE for MAP > 65   Hyperglycemia  P: -SSI   AoC anemia -iatrogenic losses from 2wk ICU course, labs etc.  P -PRN CBC   Severe protein calorie malnutrition  P: -EN per RDN   urinary retention P: -cont foley, cont urecholine   Hypervolemia  -based on I's and O's, euvolemic on exam P: Continue to diurese as able Optimize electrolytes  Pressure injury, face  -under ETT holder P -will see if we can try a different tube holder. Current one has been padded. Difficult to address this one, has been ETT x 14 days with limited options to physically move tube holders around on the face.   GOC - when we started GOC a week and a half ago pt was against a trach, quite adamantly. 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 There has not been further progress in determining GOC -- son wanted to tour LTAC 1/20.  -Unfortunately it seems like pts mentation is starting to deteriorate some. She has needed incr amounts of sedation for vent management in the last 2 weeks. 1/21 she is not very coherent when she starts to write. I worry her ability to participate in GOC planning is going to be limited.  -if trach is not pursued, I would advocate for comfort care vs 1 way extubation with transition to comfort when her resp status declines. Advocate for DNR -palliative care is following. We appreciate their assistance   Best Practice (right click and "Reselect all SmartList Selections" daily)   Diet/type: tubefeeds DVT prophylaxis prophylactic heparin  Pressure ulcer(s): N/A GI prophylaxis: PPI Lines: Central line Foley:  N/A Code Status:  full code Last date of multidisciplinary goals of care  discussion: Son updated at bedside a.m. 1/20   CRITICAL CARE Performed by: Lanier Clam   Total critical care time: 40 minutes  Critical care time was exclusive of separately billable procedures and treating other patients. Critical care was necessary to treat or prevent imminent or life-threatening deterioration.  Critical care was time spent personally by me on the following activities: development of treatment plan with patient and/or surrogate as well as nursing, discussions with consultants, evaluation of patient's response to treatment, examination of patient, obtaining history from patient or surrogate, ordering and performing treatments and interventions, ordering and review of laboratory studies, ordering and review of radiographic studies, pulse oximetry and re-evaluation of patient's condition.  Tessie Fass MSN, AGACNP-BC [facility] Pulmonary/Critical Care Medicine Amion for pager 10/24/2023, 9:23 AM

## 2023-10-24 NOTE — TOC Initial Note (Addendum)
Transition of Care [facility]) - Initial/Assessment Note    Patient Details  Name: Carla Rivera MRN: [id number] Date of Birth: 1956/07/03  [facility] CM/SW Contact:    Glennon Mac, RN Phone Number: 10/24/2023, 12:08pm   Clinical Narrative:                 Patient's son toured [facility] on Monday, per report, and was not happy with facility.  Apparently he was told by a nurse that this was the only LTAC facility in [place] over the weekend. Have left message with patient's son, Asher Muir, to discuss additional option for [facility].   Addendum: 3:15pm Able to speak with patient's son, and he is interested in [facility] for LTAC needs.  Referral has been sent to [facility] to check eligibility.  Son agrees with LTAC admissions liaison calling him to discuss facility and/or setting up a tour.    Addendum: 3:50pm Ciera in admissions at [facility] has spoken with patient's son; he plans to tour facility Thursday at 2:00pm.  Will provide updates as available.   Expected Discharge Plan: Long Term Acute Care (LTAC) Barriers to Discharge: Continued Medical Work up   Patient Goals and CMS Choice   CMS Medicare.gov Compare Post Acute Care list provided to:: Patient Represenative (must comment) (son Asher Muir) Choice offered to / list presented to : Adult Children      Expected Discharge Plan and Services   Discharge Planning Services: CM Consult Post Acute Care Choice: Long Term Acute Care (LTAC)                                        Prior Living Arrangements/Services     Patient language and need for interpreter reviewed:: Yes        Need for Family Participation in Patient Care: Yes (Comment)     Criminal Activity/Legal Involvement Pertinent to Current Situation/Hospitalization: No - Comment as needed  Activities of Daily Living   ADL Screening (condition at time of admission) Independently performs ADLs?: Yes (appropriate for  developmental age) Is the patient deaf or have difficulty hearing?: No Does the patient have difficulty seeing, even when wearing glasses/contacts?: No Does the patient have difficulty concentrating, remembering, or making decisions?: No  Permission Sought/Granted   Permission granted to share information with : Yes, Verbal Permission Granted  Share Information with NAME: Yeiri Strosnider     Permission granted to share info w Relationship: son  Permission granted to share info w Contact Information: [phone]             Admission diagnosis:  Pneumonia [J18.9] Acute on chronic respiratory failure (HCC) [J96.20] Community acquired pneumonia of right lower lobe of lung [J18.9] Patient Active Problem List   Diagnosis Date Noted   Urinary retention 10/24/2023   Hypotension due to drugs 10/23/2023   Pressure injury of skin 10/23/2023   Goals of care, counseling/discussion 10/14/2023   End stage COPD (HCC) 10/13/2023   Pneumonia due to Streptococcus pneumoniae (HCC) 10/13/2023   Severe protein-calorie malnutrition (HCC) 10/12/2023   Acute on chronic respiratory failure (HCC) 10/10/2023   Community acquired pneumonia 10/09/2023   Centrilobular emphysema (HCC) 07/15/2023   Anxiety 07/14/2023   Other dietary vitamin B12 deficiency anemia 07/14/2023   Overactive bladder 12/18/2022   AKI (acute kidney injury) (HCC)    Dehydration    Odynophagia    Esophageal thrush (  HCC) 04/22/2020   COPD exacerbation (HCC) 12/11/2016   HTN (hypertension) 12/11/2016   Depression 12/11/2016   PCP:  Jeani Sow, MD Pharmacy:   [facility] [address] [place] - [address] [place] [address] Phone: [phone] Fax: [phone]     Social Drivers of Health (SDOH) Social History: SDOH Screenings   Food Insecurity: No Food Insecurity (10/10/2023)  Housing: Patient Unable To Answer (10/10/2023)  Transportation Needs: No Transportation Needs (10/10/2023)   Utilities: Not At Risk (10/10/2023)  Depression (PHQ2-9): Medium Risk (07/14/2023)  Financial Resource Strain: Low Risk  (03/02/2023)  Physical Activity: Inactive (03/02/2023)  Social Connections: Socially Isolated (10/10/2023)  Stress: No Stress Concern Present (03/02/2023)  Tobacco Use: Medium Risk (10/09/2023)   SDOH Interventions:     Readmission Risk Interventions     No data to display         Quintella Baton, RN, BSN  Trauma/Neuro ICU Case Manager [phone]

## 2023-10-24 NOTE — Progress Notes (Addendum)
eLink Physician-Brief Progress Note Patient Name: Carla Rivera DOB: Nov 11, 1955 MRN: [id number]   Date of Service  10/24/2023  HPI/Events of Note  Notified of poor urine output. Dr Marlane Mingle had ordered a 500 cc bolus earlier. 60 cc out after the bolus. Has pink tinged urine (new per RN). Is on aspirin and heparin Carmi only. No frank blood. Asked RN to repeat a scan and nothing in bladder at this time. Is on levophed and sedation, not on standing fluids. On minimal vent settings.   eICU Interventions  Check cbc and bmp now Send UA      Intervention Category Intermediate Interventions: Oliguria - evaluation and management  Linn Goetze G Leyton Magoon 10/24/2023, 1:21 AM  Addendum at 3 am - Labs and UA back. Fairly stable anemia. Stable creatinine. UA with hematuria. Will give 100 cc NS per hour for 5 hours and see if UO picks up. Will hold [facility] heparin since hematuria is new. If this does not resolve then will need to consider renal imaging in AM.

## 2023-10-25 DIAGNOSIS — J9621 Acute and chronic respiratory failure with hypoxia: Secondary | ICD-10-CM | POA: Diagnosis not present

## 2023-10-25 DIAGNOSIS — J189 Pneumonia, unspecified organism: Secondary | ICD-10-CM | POA: Diagnosis not present

## 2023-10-25 DIAGNOSIS — J441 Chronic obstructive pulmonary disease with (acute) exacerbation: Secondary | ICD-10-CM | POA: Diagnosis not present

## 2023-10-25 DIAGNOSIS — J9622 Acute and chronic respiratory failure with hypercapnia: Secondary | ICD-10-CM | POA: Diagnosis not present

## 2023-10-25 LAB — CBC WITH DIFFERENTIAL/PLATELET
Abs Immature Granulocytes: 0.05 10*3/uL (ref 0.00–0.07)
Basophils Absolute: 0 10*3/uL (ref 0.0–0.1)
Basophils Relative: 0 %
Eosinophils Absolute: 0.2 10*3/uL (ref 0.0–0.5)
Eosinophils Relative: 2 %
HCT: 27.2 % — ABNORMAL LOW (ref 36.0–46.0)
Hemoglobin: 7.8 g/dL — ABNORMAL LOW (ref 12.0–15.0)
Immature Granulocytes: 1 %
Lymphocytes Relative: 11 %
Lymphs Abs: 1 10*3/uL (ref 0.7–4.0)
MCH: 28.3 pg (ref 26.0–34.0)
MCHC: 28.7 g/dL — ABNORMAL LOW (ref 30.0–36.0)
MCV: 98.6 fL (ref 80.0–100.0)
Monocytes Absolute: 0.8 10*3/uL (ref 0.1–1.0)
Monocytes Relative: 9 %
Neutro Abs: 6.6 10*3/uL (ref 1.7–7.7)
Neutrophils Relative %: 77 %
Platelets: 325 10*3/uL (ref 150–400)
RBC: 2.76 MIL/uL — ABNORMAL LOW (ref 3.87–5.11)
RDW: 15.1 % (ref 11.5–15.5)
WBC: 8.6 10*3/uL (ref 4.0–10.5)
nRBC: 0 % (ref 0.0–0.2)

## 2023-10-25 LAB — GLUCOSE, CAPILLARY
Glucose-Capillary: 102 mg/dL — ABNORMAL HIGH (ref 70–99)
Glucose-Capillary: 110 mg/dL — ABNORMAL HIGH (ref 70–99)
Glucose-Capillary: 117 mg/dL — ABNORMAL HIGH (ref 70–99)
Glucose-Capillary: 118 mg/dL — ABNORMAL HIGH (ref 70–99)
Glucose-Capillary: 143 mg/dL — ABNORMAL HIGH (ref 70–99)
Glucose-Capillary: 148 mg/dL — ABNORMAL HIGH (ref 70–99)
Glucose-Capillary: 164 mg/dL — ABNORMAL HIGH (ref 70–99)

## 2023-10-25 LAB — BASIC METABOLIC PANEL
Anion gap: 5 (ref 5–15)
BUN: 15 mg/dL (ref 8–23)
CO2: 34 mmol/L — ABNORMAL HIGH (ref 22–32)
Calcium: 8.6 mg/dL — ABNORMAL LOW (ref 8.9–10.3)
Chloride: 96 mmol/L — ABNORMAL LOW (ref 98–111)
Creatinine, Ser: 0.45 mg/dL (ref 0.44–1.00)
GFR, Estimated: >60 mL/min (ref >60)
Glucose, Bld: 111 mg/dL — ABNORMAL HIGH (ref 70–99)
Potassium: 4 mmol/L (ref 3.5–5.1)
Sodium: 135 mmol/L (ref 135–145)

## 2023-10-25 LAB — PHOSPHORUS: Phosphorus: 2.9 mg/dL (ref 2.5–4.6)

## 2023-10-25 LAB — MAGNESIUM: Magnesium: 2 mg/dL (ref 1.7–2.4)

## 2023-10-25 MED ORDER — MIDODRINE HCL 5 MG PO TABS
10.0000 mg | ORAL_TABLET | Freq: Two times a day (BID) | ORAL | Status: DC
  Administered 2023-10-25 – 2023-10-30 (×10): 10 mg
  Filled 2023-10-25 (×10): qty 2

## 2023-10-25 MED ORDER — JUVEN PO PACK
1.0000 | PACK | Freq: Two times a day (BID) | ORAL | Status: DC
  Administered 2023-10-25 – 2023-10-30 (×10): 1
  Filled 2023-10-25 (×10): qty 1

## 2023-10-25 MED ORDER — HEPARIN SODIUM (PORCINE) 5000 UNIT/ML IJ SOLN
5000.0000 [IU] | Freq: Three times a day (TID) | INTRAMUSCULAR | Status: DC
  Administered 2023-10-25 – 2023-10-30 (×15): 5000 [IU] via SUBCUTANEOUS
  Filled 2023-10-25 (×15): qty 1

## 2023-10-25 MED ORDER — PANCRELIPASE (LIP-PROT-AMYL) 10440-39150 UNITS PO TABS
20880.0000 [IU] | ORAL_TABLET | Freq: Once | ORAL | Status: AC
  Administered 2023-10-25: 20880 [IU]
  Filled 2023-10-25: qty 2

## 2023-10-25 MED ORDER — SODIUM BICARBONATE 650 MG PO TABS
650.0000 mg | ORAL_TABLET | Freq: Once | ORAL | Status: AC
  Administered 2023-10-25: 650 mg
  Filled 2023-10-25: qty 1

## 2023-10-25 NOTE — Progress Notes (Signed)
I personally saw the patient and performed a substantive portion of this encounter, including a complete performance of at least one of the key components (MDM, Hx and/or Exam), in conjunction with the Advanced Practice Provider.   Severe COPD admitted with exacerbation due to  rhinovirus & pneumococcal pneumonia -Remains vent dependent intubation on 1/7 -Weaning has been limited by persistent tachycardia and increased work of breathing.  She has completed 5 days of ceftriaxone &5 days of Zosyn.  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  Son did not like [facility] facility and would like to tour [facility]  Appears comfortable on Precedex and low-dose fentanyl. Started on low-dose Levophed overnight Diuresed 1.7 L with Lasix but remains with 15 L positive Afebrile  On exam -awake, able to write and communicate, bilateral ventilated breath sounds, minimal secretions, no rhonchi, no accessory muscle use, S1-S2 tacky, soft nontender abdomen, 1+ edema.  Labs show mild hyponatremia 135, no leukocytosis, stable anemia Chest x-ray shows minimal effusions and interstitial opacities.  Impression/plan Acute on chronic hypoxic and hypercarbic respiratory failure AE COPD Pneumococcal pneumonia/rhinovirus  -Continue conversations about tracheostomy with patient and son. She does not seem to be at the point of comfort care.  Waiting for son to tour [facility] before deciding -I told them that delaying tracheostomy is only prolonging her discomfort and deconditioning Meanwhile continue spontaneous breathing at times, she tolerated pressure support 10/5 but happily does not tolerate this for prolonged periods  Hypotension -related to diuresis and sedation, will add low-dose midodrine .  Monitor for infection  Severe protein calorie malnutrition  -continue tube feeds  My independent critical care time was 32 minutes  Satya Buttram V. Vassie Loll MD

## 2023-10-25 NOTE — Progress Notes (Signed)
Nutrition Follow-up  DOCUMENTATION CODES:   Severe malnutrition in context of chronic illness  INTERVENTION:   Continue tube feeding via Cortrak tube:  Osmolite 1.2  at 50 ml/h (1200 ml per day) Provides 1440 kcal, 67 gm protein, 984 ml free water daily  Juven BID  NUTRITION DIAGNOSIS:   Severe Malnutrition related to chronic illness (COPD) as evidenced by severe fat depletion, severe muscle depletion. Ongoing.   GOAL:   Patient will meet greater than or equal to 90% of their needs Met with TF at goal   MONITOR:   TF tolerance, Vent status, Labs  REASON FOR ASSESSMENT:   Consult Enteral/tube feeding initiation and management  ASSESSMENT:   Pt with hx of COPD on home O2 and HTN presented to ED with worsening SOB. Found to have pneumonia on imaging and required intubation.  Per MD daily SBT with ongoing discussions with family on GOC - trach vs compassionate extubation  Palliative following.   01/07-admitted to ICU, intubated; Cortrak placed, tube feeding initiated  Medications reviewed and include: colace, pepcid, SSI, solumedrol, miralax, senna  Precedex Fentanyl  Levophed @ 2 mcg   Labs reviewed:  CBG's: 102-110  Current weight: 49.7 kg Admission weight: 49 kg   Diet Order:   Diet Order             Diet NPO time specified  Diet effective now                   EDUCATION NEEDS:   Not appropriate for education at this time  Skin:  Skin Assessment: Skin Integrity Issues: Skin Integrity Issues:: Stage II Stage II: L face  Last BM:  1/22 small; type 7  Height:   Ht Readings from Last 1 Encounters:  10/23/23 5' (1.524 m)    Weight:   Wt Readings from Last 1 Encounters:  10/25/23 49.7 kg    Ideal Body Weight:  45.5 kg  BMI:  Body mass index is 21.4 kg/m.  Estimated Nutritional Needs:   Kcal:  1300-1500 kcal/d  Protein:  65-80 g/d  Fluid:  1.5L/d  Nidya Bouyer P., RD, LDN, CNSC See AMiON for contact information

## 2023-10-25 NOTE — Progress Notes (Signed)
Feeding tube is clogged. EN paused, protocol initiated per order.

## 2023-10-25 NOTE — Plan of Care (Signed)

## 2023-10-25 NOTE — Plan of Care (Signed)
  Problem: Clinical Measurements: Goal: Respiratory complications will improve Outcome: Not Progressing Goal: Cardiovascular complication will be avoided Outcome: Progressing   Problem: Clinical Measurements: Goal: Cardiovascular complication will be avoided Outcome: Progressing   Problem: Activity: Goal: Risk for activity intolerance will decrease Outcome: Not Progressing   Problem: Coping: Goal: Level of anxiety will decrease Outcome: Progressing   Problem: Elimination: Goal: Will not experience complications related to urinary retention Outcome: Not Progressing

## 2023-10-25 NOTE — Progress Notes (Signed)
NAME:  Carla Rivera, MRN:  [id number], DOB:  September 07, 1956, LOS: 15 ADMISSION DATE:  10/09/2023, CONSULTATION DATE:  1/7 REFERRING MD:  Dr. 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 Meredeth Ide.  Her home regimen is composed of Symbicort and scheduled DuoNebs.  She was in her usual state of health until approximately 1/4 when her baseline dyspnea worsened gradually causing her to present to med [facility] on 1/6 with these complaints.  She has been around grandchildren who have had cold symptoms recently.  Upon arrival to the med center her O2 sats were in the high 70s which did improve when oxygen was increased to 6 L to 95%.  There was concern for COPD exacerbation and she was treated with the usual modalities including steroids, nebulized bronchodilators, magnesium, and antibiotics.  Chest radiograph concerning for pneumonia.  Despite the aforementioned treatment her condition continued to worsen as did her oxygen requirement as well.  Ultimately she required BiPAP which she was unfortunately able to tolerate.  She was transferred ED to ED to [facility] where PCCM was asked to admit to ICU.  Pertinent  Medical History   has a past medical history of Arthritis, COPD (chronic obstructive pulmonary disease) (HCC), Depression, and HTN (hypertension).   Significant Hospital Events: Including procedures, antibiotic start and stop dates in addition to other pertinent events   1/7 Admitted with resp failure, intubated, PNA, shock 1/9 failed wean completed 3d azithro  1/10 failed wean again  1/12 Needed increased sedation late shift yesterday and overnight. Some associated hypotension, now off pressors again  1/13 no acute events overnight, remains on low-dose fentanyl, intubated. Almost self- extubated   1/16 CVC not working 2/3 lumens. Tmax 100.4. 1/17 tolerated SBT this a.m. 5/12 for 1 hour prior to tachycardia and increased work of breathing 1/20 incr sedation needs. Failed SBT 1/21 incr sedation needs again. More delirious  1/22 following commands, less delirium on morning assessment   Interim History / Subjective:  Unable to tolerate SBT, able to follow commands Avoiding decisions in regards to trache vs comfort   Objective   Blood pressure (!) 101/57, pulse 67, temperature 98.6 F (37 C), temperature source Axillary, resp. rate (!) 27, height 5' (1.524 m), weight 49.7 kg, SpO2 100%.    Vent Mode: PCV FiO2 (%):  [40 %] 40 % Set Rate:  [18 bmp] 18 bmp PEEP:  [5 cmH20] 5 cmH20 Plateau Pressure:  [16 cmH20-26 cmH20] 20 cmH20   Intake/Output Summary (Last 24 hours) at 10/25/2023 1117 Last data filed at 10/25/2023 1000 Gross per 24 hour  Intake 1650.27 ml  Output 1690 ml  Net -39.73 ml   Filed Weights   10/23/23 0500 10/24/23 0500 10/25/23 0413  Weight: 54.2 kg 52 kg 49.7 kg    Examination: General: acute on chronic ill appearing older female on ICU bed, on vent, weak  HEENT: Normocephalic, poor dentition/missing teeth, ETT, cortrak, facial pressure injury-left cheek Pulm: clear/diminished breath sounds throughout, failing SBT Cards: s1,s2, RRR, no JVD, no MRG ABS: soft, active, cortrak  Extremities: moves all extremities  Skin: non pitting edema, left facial pressure injury  Neuro: RASS 0, following commands, intermittent delirium PERRLA intact  Resolved Hospital Problem list   Shock  Hypotension  Assessment & Plan:   AoC hypoxic and hypercarbic  resp failure AECOPD, underlying end stage COPD Pneumococcal PNA, Rhinovirus and Coronavirus OC43 infections, POA  -completed 5d rocephin 3 day azithro starting at admission. -was started on zosyn 1/16 -- no pathogen isolated from trach asp 1/15-- and has completed 5d course of this as well  not been able to make  significant progress with SBTs in 2weeks. P: Continue steroids for now, continue BD triple therapy  Continue VAP and PAD protocol Has failed SBT trials-discussion of trache vs comfort measures-waiting on patient/family Continue GOC with patient and family   Sedation related hypotension Hx  HTN  On levophed  P Continue to hold antihypertensives Continue Levophed MAP >65, wean as tolerated   Hyperglycemia  On SSI P; Continue SSI Continue CBG q4  Continue feeding via cortrak   AoC anemia -iatrogenic losses from 2wk ICU course, labs etc.  P Continue to trend CBC, h/h  Monitor for signs of bleeding Transfuse hgb <7   Severe protein calorie malnutrition  P: Continue tube feeds per cortrak Appreciate RD's assistance   urinary retention P: Continue foley, monitor urine output  Continue urecholine   Hypervolemia  -based on I's and O's, euvolemic on exam Still 15 liters positive  1.7Ls out with diuresis  P: Hold off diuresis today due to hypotension on pressor, re-evaluate on 1/23 Continue to optimization of electrolytes, trend BMETs daily/mag and phos daily    Pressure injury, face  -under ETT holder Current one has been padded. Difficult to address this one, has been ETT x 14 days with limited options to physically move tube holders around on the face.  P: Continue wound care as able  GOC - when we started GOC a week and a half ago pt was against a trach, quite adamantly. 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 There has not been further progress in determining GOC -- son wanted to tour LTAC 1/20.  -Unfortunately it seems like pts mentation is starting to deteriorate some. She has needed incr amounts of sedation for vent management in the last 2 weeks. 1/21 she is not very coherent when she starts to write. I worry her ability to participate in GOC planning is going to be limited.  1/22 Still waiting on patient/family in regards to decisions of  trache vs comfort care. Currently patient avoiding question in regards to trache. [facility] plans to visit [facility] on 1/[facility] and hopefully have decision on 1/24.  P: Continue GOC with patient and family Palliative following appreciate assitance   Best Practice (right click and "Reselect all SmartList Selections" daily)   Diet/type: tubefeeds DVT prophylaxis prophylactic heparin  Pressure ulcer(s): N/A GI prophylaxis: PPI Lines: Central line Foley:  N/A Code Status:  full code Last date of multidisciplinary goals of care discussion: Son updated at bedside a.m. 1/20   Hazel Sams AGACNP-BC   [facility] 10/25/2023, 11:50 AM  Please see [email or web address] for pager details.  From 7A-7P if no response, please call [phone]. After hours, please call ELink [phone].

## 2023-10-26 DIAGNOSIS — J441 Chronic obstructive pulmonary disease with (acute) exacerbation: Secondary | ICD-10-CM | POA: Diagnosis not present

## 2023-10-26 DIAGNOSIS — J189 Pneumonia, unspecified organism: Secondary | ICD-10-CM | POA: Diagnosis not present

## 2023-10-26 DIAGNOSIS — J9621 Acute and chronic respiratory failure with hypoxia: Secondary | ICD-10-CM | POA: Diagnosis not present

## 2023-10-26 DIAGNOSIS — J9622 Acute and chronic respiratory failure with hypercapnia: Secondary | ICD-10-CM | POA: Diagnosis not present

## 2023-10-26 LAB — GLUCOSE, CAPILLARY
Glucose-Capillary: 113 mg/dL — ABNORMAL HIGH (ref 70–99)
Glucose-Capillary: 119 mg/dL — ABNORMAL HIGH (ref 70–99)
Glucose-Capillary: 123 mg/dL — ABNORMAL HIGH (ref 70–99)
Glucose-Capillary: 148 mg/dL — ABNORMAL HIGH (ref 70–99)
Glucose-Capillary: 152 mg/dL — ABNORMAL HIGH (ref 70–99)
Glucose-Capillary: 180 mg/dL — ABNORMAL HIGH (ref 70–99)

## 2023-10-26 LAB — MAGNESIUM: Magnesium: 2.1 mg/dL (ref 1.7–2.4)

## 2023-10-26 MED ORDER — PREDNISONE 20 MG PO TABS
10.0000 mg | ORAL_TABLET | Freq: Every day | ORAL | Status: DC
  Administered 2023-10-26 – 2023-10-30 (×5): 10 mg
  Filled 2023-10-26 (×5): qty 1

## 2023-10-26 MED ORDER — FUROSEMIDE 10 MG/ML IJ SOLN
20.0000 mg | Freq: Once | INTRAMUSCULAR | Status: AC
  Administered 2023-10-26: 20 mg via INTRAVENOUS
  Filled 2023-10-26: qty 2

## 2023-10-26 NOTE — Progress Notes (Addendum)
NAME:  Carla Rivera, MRN:  [id number], DOB:  Aug 25, 1956, LOS: 16 ADMISSION DATE:  10/09/2023, CONSULTATION DATE:  1/7 REFERRING MD:  Dr. 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 Meredeth Ide.  Her home regimen is composed of Symbicort and scheduled DuoNebs.  She was in her usual state of health until approximately 1/4 when her baseline dyspnea worsened gradually causing her to present to med [facility] on 1/6 with these complaints.  She has been around grandchildren who have had cold symptoms recently.  Upon arrival to the med center her O2 sats were in the high 70s which did improve when oxygen was increased to 6 L to 95%.  There was concern for COPD exacerbation and she was treated with the usual modalities including steroids, nebulized bronchodilators, magnesium, and antibiotics.  Chest radiograph concerning for pneumonia.  Despite the aforementioned treatment her condition continued to worsen as did her oxygen requirement as well.  Ultimately she required BiPAP which she was unfortunately able to tolerate.  She was transferred ED to ED to [facility] where PCCM was asked to admit to ICU.  Pertinent  Medical History   has a past medical history of Arthritis, COPD (chronic obstructive pulmonary disease) (HCC), Depression, and HTN (hypertension).   Significant Hospital Events: Including procedures, antibiotic start and stop dates in addition to other pertinent events   1/7 Admitted with resp failure, intubated, PNA, shock 1/9 failed wean completed 3d azithro  1/10 failed wean again  1/12 Needed increased sedation late shift yesterday and overnight. Some associated hypotension, now off pressors again  1/13 no acute events overnight, remains on low-dose fentanyl, intubated. Almost self- extubated   1/16 CVC not working 2/3 lumens. Tmax 100.4. 1/17 tolerated SBT this a.m. 5/12 for 1 hour prior to tachycardia and increased work of breathing 1/20 incr sedation needs. Failed SBT 1/21 incr sedation needs again. 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  Son did not like [facility] facility and would like to tour [facility]  1/22 following commands, less delirium on morning assessment   Interim History / Subjective:   Afebrile No events overnight Remains on low-dose Precedex and fentanyl  Objective   Blood pressure (!) 147/62, pulse (!) 111, temperature 98.9 F (37.2 C), temperature source Axillary, resp. rate 20, height 5' (1.524 m), weight 49.7 kg, SpO2 98%.    Vent Mode: PSV;CPAP FiO2 (%):  [40 %] 40 % Set Rate:  [18 bmp] 18 bmp PEEP:  [5 cmH20] 5 cmH20 Pressure Support:  [10 cmH20] 10 cmH20 Plateau Pressure:  [16 cmH20-21 cmH20] 19 cmH20   Intake/Output Summary (Last 24 hours) at 10/26/2023 0859 Last data filed at 10/26/2023 0700 Gross per 24 hour  Intake 1847.18 ml  Output 1200 ml  Net 647.18 ml   Filed Weights   10/23/23 0500 10/24/23 0500 10/25/23 0413  Weight: 54.2 kg 52 kg 49.7 kg    Examination: General: acute on chronic ill appearing older female on ICU bed, on vent, weak  HEENT: Normocephalic, poor dentition/missing teeth, ETT, cortrak, facial pressure injury-left cheek Pulm: No accessory muscle use, decreased breath sounds bilateral Cards: s1,s2, RRR, no JVD, no MRG ABS: soft, active, cortrak  Extremities: No deformity, 1+ edema  Skin: non pitting edema, left facial pressure injury  Neuro: RASS 0, interactive, communicates by writing, follows one-step commands, nonfocal  Resolved Hospital Problem list   Shock  Hypotension  Assessment & Plan:   AoC hypoxic and hypercarbic resp failure AECOPD, underlying end stage COPD Pneumococcal PNA, Rhinovirus  and Coronavirus OC43 infections, POA  -completed 5d rocephin 3 day azithro starting at admission. -Completed 5 days of zosyn 1/20 empiric, trach asp neg not been able to make significant progress with SBTs in 2weeks. P: Taper down to 10 mg prednisone and maintain at this dose Budesonide/Brovana and Yupelri combination Continue VAP and PAD protocol Has failed SBT trials-discussion of trache vs comfort measures-waiting on patient/family Continue GOC with patient and family   Sedation related hypotension Hx  HTN    P Continue to hold antihypertensives Off Levophed  Hyperglycemia  On SSI P; Continue SSI Continue CBG q4  Continue feeding via cortrak   AoC anemia -iatrogenic losses from 2wk ICU course, labs etc.  P Continue to trend CBC, h/h  Monitor for signs of bleeding Transfuse hgb <7   Severe protein calorie malnutrition  P: Continue tube feeds per cortrak Appreciate RD's assistance   urinary retention P: Continue foley, monitor urine output  Continue urecholine   Hypervolemia  -based on I's and O's, euvolemic on exam Still 15 liters positive  Responds well to diuretic but ends up requiring pressors P: Lasix 20 mg today  Pressure injury, face  -under ETT holder Current one has been padded. Difficult to address this one, has been ETT x 14 days with limited options to physically move tube holders around on the face.  P: Continue wound care as able  GOC - when we started GOC a week and a half ago pt was against a trach, quite adamantly. 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 There has not been further progress in determining GOC -- son did not like [facility] facility. Son plans to visit [facility] on 1/23 and hopefully have decision on 1/24.  P: Continue GOC with patient and family, hope to have decision regarding tracheostomy today Palliative following appreciate assitance   Best Practice (right click and "Reselect all SmartList  Selections" daily)   Diet/type: tubefeeds DVT prophylaxis prophylactic heparin  Pressure ulcer(s): N/A GI prophylaxis: PPI Lines: Central line Foley:  N/A Code Status:  full code Last date of multidisciplinary goals of care discussion: Son updated at bedside daily  My independent critical care time was 32 minutes  Cyril Mourning MD. Tonny Bollman. [place] [facility] Pager : [phone]  If no response to pager , please call [phone] until 7 pm After 7:00 pm call Elink  [phone]    10/26/2023, 8:59 AM

## 2023-10-26 NOTE — Plan of Care (Signed)
  Problem: Clinical Measurements: Goal: Respiratory complications will improve Outcome: Not Progressing   Problem: Activity: Goal: Risk for activity intolerance will decrease Outcome: Not Progressing   Problem: Nutrition: Goal: Adequate nutrition will be maintained Outcome: Progressing   Problem: Metabolic: Goal: Ability to maintain appropriate glucose levels will improve Outcome: Progressing

## 2023-10-26 NOTE — Progress Notes (Signed)
Turned Fentanyl gtts back on because patient was being very dyssynchronous with went and dropping sats.  Needs some rest after weaning for the afternoon.

## 2023-10-26 NOTE — Plan of Care (Signed)
Patient weaned on vent for about 3 hrs.  Back to full support and sedation restarted due to no compliance with the vent and low sats.  Improved after sedation restarted.

## 2023-10-27 DIAGNOSIS — J9621 Acute and chronic respiratory failure with hypoxia: Secondary | ICD-10-CM | POA: Diagnosis not present

## 2023-10-27 DIAGNOSIS — J441 Chronic obstructive pulmonary disease with (acute) exacerbation: Secondary | ICD-10-CM | POA: Diagnosis not present

## 2023-10-27 DIAGNOSIS — J9622 Acute and chronic respiratory failure with hypercapnia: Secondary | ICD-10-CM | POA: Diagnosis not present

## 2023-10-27 DIAGNOSIS — J189 Pneumonia, unspecified organism: Secondary | ICD-10-CM | POA: Diagnosis not present

## 2023-10-27 LAB — BASIC METABOLIC PANEL
Anion gap: 5 (ref 5–15)
BUN: 27 mg/dL — ABNORMAL HIGH (ref 8–23)
CO2: 41 mmol/L — ABNORMAL HIGH (ref 22–32)
Calcium: 9.4 mg/dL (ref 8.9–10.3)
Chloride: 96 mmol/L — ABNORMAL LOW (ref 98–111)
Creatinine, Ser: 0.57 mg/dL (ref 0.44–1.00)
GFR, Estimated: >60 mL/min (ref >60)
Glucose, Bld: 123 mg/dL — ABNORMAL HIGH (ref 70–99)
Potassium: 4.9 mmol/L (ref 3.5–5.1)
Sodium: 142 mmol/L (ref 135–145)

## 2023-10-27 LAB — GLUCOSE, CAPILLARY
Glucose-Capillary: 120 mg/dL — ABNORMAL HIGH (ref 70–99)
Glucose-Capillary: 124 mg/dL — ABNORMAL HIGH (ref 70–99)
Glucose-Capillary: 127 mg/dL — ABNORMAL HIGH (ref 70–99)
Glucose-Capillary: 132 mg/dL — ABNORMAL HIGH (ref 70–99)
Glucose-Capillary: 133 mg/dL — ABNORMAL HIGH (ref 70–99)
Glucose-Capillary: 98 mg/dL (ref 70–99)

## 2023-10-27 LAB — CBC
HCT: 28.4 % — ABNORMAL LOW (ref 36.0–46.0)
Hemoglobin: 7.9 g/dL — ABNORMAL LOW (ref 12.0–15.0)
MCH: 28.6 pg (ref 26.0–34.0)
MCHC: 27.8 g/dL — ABNORMAL LOW (ref 30.0–36.0)
MCV: 102.9 fL — ABNORMAL HIGH (ref 80.0–100.0)
Platelets: 264 10*3/uL (ref 150–400)
RBC: 2.76 MIL/uL — ABNORMAL LOW (ref 3.87–5.11)
RDW: 14.5 % (ref 11.5–15.5)
WBC: 10.6 10*3/uL — ABNORMAL HIGH (ref 4.0–10.5)
nRBC: 0 % (ref 0.0–0.2)

## 2023-10-27 LAB — MAGNESIUM: Magnesium: 2.4 mg/dL (ref 1.7–2.4)

## 2023-10-27 MED ORDER — FUROSEMIDE 10 MG/ML IJ SOLN
20.0000 mg | Freq: Once | INTRAMUSCULAR | Status: AC
  Administered 2023-10-27: 20 mg via INTRAVENOUS
  Filled 2023-10-27: qty 2

## 2023-10-27 NOTE — Progress Notes (Signed)
At this time pt's son Francia Verry was able to get pt's rings off and he will be taking possession of the x4 rings.

## 2023-10-27 NOTE — Plan of Care (Signed)
  Problem: Clinical Measurements: Goal: Diagnostic test results will improve Outcome: Progressing Goal: Respiratory complications will improve Outcome: Progressing Goal: Cardiovascular complication will be avoided Outcome: Progressing   Problem: Elimination: Goal: Will not experience complications related to bowel motility Outcome: Progressing   Problem: Coping: Goal: Level of anxiety will decrease Outcome: Not Progressing   Problem: Elimination: Goal: Will not experience complications related to urinary retention Outcome: Not Progressing

## 2023-10-27 NOTE — Plan of Care (Signed)

## 2023-10-27 NOTE — TOC Progression Note (Signed)
Transition of Care [facility]) - Progression Note    Patient Details  Name: Carla Rivera MRN: [id number] Date of Birth: Sep 23, 1956  Transition of Care [facility]) CM/SW Contact  Glennon Mac, RN Phone Number: 10/27/2023, 5:01 PM  Clinical Narrative:    Patient's son toured [facility] yesterday; awaiting arrival of family over the weekend to assist with decision making.  Will continue to follow.   Expected Discharge Plan: Long Term Acute Care (LTAC) Barriers to Discharge: Continued Medical Work up  Expected Discharge Plan and Services   Discharge Planning Services: CM Consult Post Acute Care Choice: Long Term Acute Care (LTAC)                                         Social Determinants of Health (SDOH) Interventions SDOH Screenings   Food Insecurity: No Food Insecurity (10/10/2023)  Housing: Patient Unable To Answer (10/10/2023)  Transportation Needs: No Transportation Needs (10/10/2023)  Utilities: Not At Risk (10/10/2023)  Depression (PHQ2-9): Medium Risk (07/14/2023)  Financial Resource Strain: Low Risk  (03/02/2023)  Physical Activity: Inactive (03/02/2023)  Social Connections: Socially Isolated (10/10/2023)  Stress: No Stress Concern Present (03/02/2023)  Tobacco Use: Medium Risk (10/09/2023)    Readmission Risk Interventions     No data to display         Quintella Baton, RN, BSN  Trauma/Neuro ICU Case Manager [phone]

## 2023-10-27 NOTE — Progress Notes (Addendum)
This chaplain is present to F/U on the spiritual care relationship with the Pt. and the Pt. son-Carla Rivera. The chaplain understands Carla Rivera is requesting a spiritual care presence.  The chaplain received an update from the Pt. RN-Ken. The chaplain understands "goals of care" conversations are continuing and the RN-Ken is attempting to contact Carla Rivera to determine a time he will visit.  **[id number] This chaplain appreciates the RN communication of the Pt. son-Carla Rivera's bedside presence. The chaplain introduced herself and begin rapport building with the Pt. and Carla Rivera. The Pt. is awake during the visit. The chaplain understands Carla Rivera visits the Pt. from 8:30-10am and returns to the bedside at 4:30pm each day. The chaplain understands Carla Rivera is requesting prayer in the time together.   Carla Rivera accepted the chaplain invitation for storytelling and learning more about the Pt. quality of life. The Pt. has three children. The Pt. lives with Carla Rivera. Carla Rivera's siblings will visit this weekend. The Pt. grandchildren bring the Pt joy, along with shopping trips and her shows. If possible the Pt. enjoys outdoor time.  The chaplain listened as Carla Rivera described "yesterday as a hard day, but she pulled through." At the end of the visit, the chaplain affirmed Carla Rivera's voice in the Pt. care and the benefits of communication with the medical team. Carla Rivera shared he is aware of the supportive role of  Palliative Care. The chaplain voiced her observation of the Pt. furrowed brow, as a possible indication of the Pt. discomfort. Carla Rivera did not respond.   The chaplain ended the visit with prayer and an accepted invitation for F/U spiritual care.   Chaplain Stephanie Acre [phone]

## 2023-10-27 NOTE — Progress Notes (Signed)
Pt w/ 4 rings on left 3rd finger. D/t pt's swelling in her hands, I asked pt if she was ok w/ her son Makyna Niehoff) taking her rings home. Pt able to follow commands and responded w/ a nod stating that she does not mind her son taking possession of her rings. TRN nurse called for assistance following my attempt to remove her rings w/ lubricant. TRN nurse to come to attempt to remove rings.

## 2023-10-27 NOTE — Progress Notes (Signed)
NAME:  Carla Rivera, MRN:  [id number], DOB:  1956-03-05, LOS: 17 ADMISSION DATE:  10/09/2023, CONSULTATION DATE:  1/7 REFERRING MD:  Dr. 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 Meredeth Ide.  Her home regimen is composed of Symbicort and scheduled DuoNebs.  She was in her usual state of health until approximately 1/4 when her baseline dyspnea worsened gradually causing her to present to med [facility] on 1/6 with these complaints.  She has been around grandchildren who have had cold symptoms recently.  Upon arrival to the med center her O2 sats were in the high 70s which did improve when oxygen was increased to 6 L to 95%.  There was concern for COPD exacerbation and she was treated with the usual modalities including steroids, nebulized bronchodilators, magnesium, and antibiotics.  Chest radiograph concerning for pneumonia.  Despite the aforementioned treatment her condition continued to worsen as did her oxygen requirement as well.  Ultimately she required BiPAP which she was unfortunately able to tolerate.  She was transferred ED to ED to [facility] where PCCM was asked to admit to ICU.  Pertinent  Medical History   has a past medical history of Arthritis, COPD (chronic obstructive pulmonary disease) (HCC), Depression, and HTN (hypertension).   Significant Hospital Events: Including procedures, antibiotic start and stop dates in addition to other pertinent events   1/7 Admitted with resp failure, intubated, PNA, shock 1/9 failed wean completed 3d azithro  1/10 failed wean again  1/12 Needed increased sedation late shift yesterday and overnight. Some associated hypotension, now off pressors again  1/13 no acute events overnight, remains on low-dose fentanyl, intubated. Almost self- extubated   1/16 CVC not working 2/3 lumens. Tmax 100.4. 1/17 tolerated SBT this a.m. 5/12 for 1 hour prior to tachycardia and increased work of breathing 1/20 incr sedation needs. Failed SBT 1/21 incr sedation needs again. 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  Son did not like [facility] facility and would like to tour [facility]  1/22 following commands, less delirium on morning assessment   Interim History / Subjective:   Weaned on pressure support 10/5 and then got very tired by evening requiring increased sedation Afebrile Tachycardic  Objective   Blood pressure (!) 128/59, pulse (!) 107, temperature 98 F (36.7 C), temperature source Axillary, resp. rate 20, height 5' (1.524 m), weight 53.3 kg, SpO2 100%.    Vent Mode: PCV FiO2 (%):  [40 %-50 %] 50 % Set Rate:  [18 bmp] 18 bmp PEEP:  [5 cmH20] 5 cmH20 Pressure Support:  [10 cmH20] 10 cmH20 Plateau Pressure:  [19 cmH20-26 cmH20] 19 cmH20   Intake/Output Summary (Last 24 hours) at 10/27/2023 0831 Last data filed at 10/27/2023 0800 Gross per 24 hour  Intake 1843.24 ml  Output 2225 ml  Net -381.76 ml   Filed Weights   10/24/23 0500 10/25/23 0413 10/27/23 0500  Weight: 52 kg 49.7 kg 53.3 kg    Examination: General: acute on chronic ill appearing older female on ICU bed, on vent, feels weak and frail and tired HEENT: Normocephalic, poor dentition/missing teeth, ETT, cortrak, facial pressure injury-left cheek Pulm: No accessory muscle use, bilateral ventilated breath sounds Cards: s1,s2, RRR, no JVD,  no MRG ABS: soft, active, cortrak  Extremities: No deformity, 1+ edema Skin: non pitting edema, left facial pressure injury  Neuro: RASS 0, not interactive today appears tired, grossly nonfocal  Labs show normal electrolytes, stable anemia, no leukocytosis  Resolved Hospital Problem list   Shock  Hypotension  Assessment  & Plan:   AoC hypoxic and hypercarbic resp failure AECOPD, underlying end stage COPD Pneumococcal PNA, Rhinovirus and Coronavirus OC43 infections, POA  -completed 5d rocephin 3 day azithro starting at admission. -Completed 5 days of zosyn 1/20 empiric, trach asp neg not been able to make significant progress with SBTs in 2weeks. P: Taper down to 10 mg prednisone and maintain at this dose Budesonide/Brovana and Yupelri combination Continue VAP and PAD protocol -Precedex and fentanyl goal RASS 0 to -1 Has failed SBT trials-discussion of trach vs comfort measures-son leaning towards comfort   Sedation related hypotension Hx  HTN    P Continue to hold antihypertensives Off Levophed  Hyperglycemia  On SSI P; Continue SSI Continue CBG q4  Continue feeding via cortrak   AoC anemia -iatrogenic losses from 2wk ICU course, labs etc.  P Continue to trend CBC, h/h  Monitor for signs of bleeding Transfuse hgb <7   Severe protein calorie malnutrition  P: Continue tube feeds per cortrak Appreciate RD's assistance   urinary retention P: Continue foley, monitor urine output  Continue urecholine   Hypervolemia  -based on I's and O's, euvolemic on exam Still 15 liters positive  Responds well to diuretic but ends up requiring pressors P: Lasix 20 mg repeat today  Pressure injury, face  -under ETT holder Current one has been padded. Difficult to address this one, has been ETT x 14 days with limited options to physically move tube holders around on the face.  P: Continue wound care as able  GOC - when we started GOC a week and a half ago pt was against a trach, quite adamantly. 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  -- son did not like [facility] facility.  [facility] 1[facility] P: Based on discussion 1/23, son would like to talk to patient's tomorrow and await arrival of further family over the weekend.  He is leaning towards comfort care and  withdrawal of ventilator.  He understands that quality of life would be very poor with tracheostomy even though this would be life-prolonging and chances of liberation from the vent are small  Best Practice (right click and "Reselect all SmartList Selections" daily)   Diet/type: tubefeeds DVT prophylaxis prophylactic heparin  Pressure ulcer(s): N/A GI prophylaxis: PPI Lines: Central line Foley:  N/A Code Status:  full code Last date of multidisciplinary goals of care discussion: Son updated at bedside daily  My independent critical care time was 31 minutes  Cyril Mourning MD. Tonny Bollman. [place] [facility] Pager : [phone]  If no response to pager , please call [phone] until 7 pm After 7:00 pm call Elink  [phone]    10/27/2023, 8:31 AM

## 2023-10-28 ENCOUNTER — Inpatient Hospital Stay (HOSPITAL_COMMUNITY): Payer: Medicare PPO

## 2023-10-28 DIAGNOSIS — J189 Pneumonia, unspecified organism: Secondary | ICD-10-CM | POA: Diagnosis not present

## 2023-10-28 DIAGNOSIS — J9622 Acute and chronic respiratory failure with hypercapnia: Secondary | ICD-10-CM | POA: Diagnosis not present

## 2023-10-28 DIAGNOSIS — J441 Chronic obstructive pulmonary disease with (acute) exacerbation: Secondary | ICD-10-CM | POA: Diagnosis not present

## 2023-10-28 DIAGNOSIS — J9621 Acute and chronic respiratory failure with hypoxia: Secondary | ICD-10-CM | POA: Diagnosis not present

## 2023-10-28 LAB — POCT I-STAT 7, (LYTES, BLD GAS, ICA,H+H)
Acid-Base Excess: 21 mmol/L — ABNORMAL HIGH (ref 0.0–2.0)
Bicarbonate: 48.7 mmol/L — ABNORMAL HIGH (ref 20.0–28.0)
Calcium, Ion: 1.27 mmol/L (ref 1.15–1.40)
HCT: 26 % — ABNORMAL LOW (ref 36.0–46.0)
Hemoglobin: 8.8 g/dL — ABNORMAL LOW (ref 12.0–15.0)
O2 Saturation: 98 %
Patient temperature: 99.8
Potassium: 4.1 mmol/L (ref 3.5–5.1)
Sodium: 138 mmol/L (ref 135–145)
TCO2: >50 mmol/L — ABNORMAL HIGH (ref 22–32)
pCO2 arterial: 78.8 mm[Hg] (ref 32–48)
pH, Arterial: 7.402 (ref 7.35–7.45)
pO2, Arterial: 108 mm[Hg] (ref 83–108)

## 2023-10-28 LAB — GLUCOSE, CAPILLARY
Glucose-Capillary: 115 mg/dL — ABNORMAL HIGH (ref 70–99)
Glucose-Capillary: 131 mg/dL — ABNORMAL HIGH (ref 70–99)
Glucose-Capillary: 138 mg/dL — ABNORMAL HIGH (ref 70–99)
Glucose-Capillary: 138 mg/dL — ABNORMAL HIGH (ref 70–99)
Glucose-Capillary: 146 mg/dL — ABNORMAL HIGH (ref 70–99)
Glucose-Capillary: 149 mg/dL — ABNORMAL HIGH (ref 70–99)

## 2023-10-28 NOTE — Plan of Care (Signed)
Problem: Education: Goal: Knowledge of General Education information will improve Description: Including pain rating scale, medication(s)/side effects and non-pharmacologic comfort measures Outcome: Progressing   Problem: Clinical Measurements: Goal: Diagnostic test results will improve Outcome: Progressing Goal: Respiratory complications will improve Outcome: Progressing   Problem: Nutrition: Goal: Adequate nutrition will be maintained Outcome: Progressing

## 2023-10-28 NOTE — Progress Notes (Signed)
PCCM called to bedside to assess airway for concern for extubation. Patient had drop in sats and 100-200 exhaled tidal volume. Patient ETT in same position at 21 @ lip. No cuff leak. Being bagged on arrival w/ sats 100%. Co2 color changer placed with color change and b/l BS present. Patient placed back on vent. Was originally on PC and switched to [facility]. Suctioned thick tan/white secretions possible tube feeds.   Plan: -Will check CXR.  -Suction prn.  -Hold TF overnight.  -Sedation for RASS 0 to -1 or for vent synchrony.   JD Anselm Lis [place] [facility] 10/28/2023, 2:23 AM  Please see [email or web address] for pager details.  From 7A-7P if no response, please call [phone]. After hours, please call ELink [phone].

## 2023-10-28 NOTE — Progress Notes (Signed)
NAME:  Carla Rivera, MRN:  [id number], DOB:  [birth year], LOS: 18 ADMISSION DATE:  10/09/2023, CONSULTATION DATE:  1/7 REFERRING MD:  Dr. 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 Meredeth Ide.  Her home regimen is composed of Symbicort and scheduled DuoNebs.  She was in her usual state of health until approximately 1/4 when her baseline dyspnea worsened gradually causing her to present to med [facility] on 1/6 with these complaints.  She has been around grandchildren who have had cold symptoms recently.  Upon arrival to the med center her O2 sats were in the high 70s which did improve when oxygen was increased to 6 L to 95%.  There was concern for COPD exacerbation and she was treated with the usual modalities including steroids, nebulized bronchodilators, magnesium, and antibiotics.  Chest radiograph concerning for pneumonia.  Despite the aforementioned treatment her condition continued to worsen as did her oxygen requirement as well.  Ultimately she required BiPAP which she was unfortunately able to tolerate.  She was transferred ED to ED to [facility] where PCCM was asked to admit to ICU.  Pertinent  Medical History   has a past medical history of Arthritis, COPD (chronic obstructive pulmonary disease) (HCC), Depression, and HTN (hypertension).   Significant Hospital Events: Including procedures, antibiotic start and stop dates in addition to other pertinent events   1/7 Admitted with resp failure, intubated, PNA, shock 1/9 failed wean completed 3d azithro  1/10 failed wean again  1/12 Needed increased sedation late shift yesterday and overnight. Some associated hypotension, now off pressors again  1/13 no acute events overnight, remains on low-dose fentanyl, intubated. Almost self- extubated   1/16 CVC not working 2/3 lumens. Tmax 100.4. 1/17 tolerated SBT this a.m. 5/12 for 1 hour prior to tachycardia and increased work of breathing 1/20 incr sedation needs. Failed SBT 1/21 incr sedation needs again. 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  Son did not like [facility] facility and would like to tour [facility]  1/22 following commands, less delirium  1/23 Weaned on pressure support 10/5 and then got very tired by evening requiring increased sedation Interim History / Subjective:   Episode of respiratory distress , with some concern of extubation, low tidal volumes on vent, changed to PRVC. Chest x-ray shows ET tube in position no new infiltrates Back on Levophed overnight Diuresed 3.6 L with Lasix 20 mg   Objective   Blood pressure 114/66, pulse 94, temperature 99.1 F (37.3 C), temperature source Axillary, resp. rate 14, height 5' (1.524 m), weight 54.1 kg, SpO2 100%.    Vent Mode: PRVC FiO2 (%):  [40 %-50 %] 40 % Set Rate:  [18 bmp] 18 bmp Vt Set:  [360 mL] 360 mL PEEP:  [5 cmH20] 5 cmH20 Plateau Pressure:  [14 cmH20-32 cmH20] 26 cmH20   Intake/Output Summary (Last 24 hours) at 10/28/2023 0908 Last data filed at 10/28/2023 0700 Gross per 24 hour  Intake 1499.64 ml  Output 3485 ml  Net -1985.36 ml   Filed Weights   10/25/23 0413 10/27/23 0500 10/28/23 0423  Weight: 49.7 kg 53.3 kg 54.1 kg    Examination: General: acute on chronic ill appearing older female on ICU bed,  on vent, appears weak and frail and tired HEENT: Normocephalic, poor dentition/missing teeth, ETT, cortrak, facial pressure injury-left cheek Pulm: Mild accessory muscle use, bilateral ventilated breath sounds Cards: s1,s2 tacky, regular ABS: soft, active, cortrak  Extremities: No deformity, 1+ edema Skin: non pitting edema, left facial pressure injury  Neuro: RASS 0,  appears tired,  grossly nonfocal, does not follow commands  Labs show normal electrolytes, stable anemia, no leukocytosis ABG showed compensated hypercarbia  Resolved Hospital Problem list   Shock  Hypotension  Assessment & Plan:   AoC hypoxic and hypercarbic resp failure AECOPD, underlying end stage COPD Pneumococcal PNA, Rhinovirus and Coronavirus OC43 infections, POA  -completed 5d rocephin 3 day azithro starting at admission. -Completed 5 days of zosyn 1/20 empiric, trach asp neg Not making significant progress with SBTs in 2weeks. P: Maintain  10 mg prednisone Budesonide/Brovana and Yupelri combination Continue VAP and PAD protocol -Precedex and fentanyl goal RASS 0 to -1 Has failed SBT trials-discussion of trach vs comfort measures-son leaning towards comfort Prefer PRVC to pressure control mode  Sedation/auto PEEP related hypotension Hx  HTN  Intermittently requiring Levophed P Continue to hold antihypertensives Back on Levophed  Hyperglycemia  On SSI P; Continue SSI Continue CBG q4  Continue feeding via cortrak   AoC anemia -Critical illness related P Continue to trend CBC, h/h  Monitor for signs of bleeding Transfuse hgb <7   Severe protein calorie malnutrition  P: Continue tube feeds per cortrak  urinary retention P: Continue foley, monitor urine output  Continue urecholine   Hypervolemia  -based on I's and O's, euvolemic on exam Still 15 liters positive  Responds well to diuretic but ends up requiring pressors P: Excellent diuresis with Lasix, hold today since on Levophed  Pressure injury, face  -under ETT holder Current one has been padded. Difficult to address this one, has been ETT x 14 days with limited options to physically move tube holders around on the face.  P: Continue wound care as able  GOC - when we started GOC a week and a half ago pt was against a trach, quite adamantly. 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  --  son did not like [facility] facility.  He visited [facility] 1[facility] P: Based on last discussion with son, he is leaning towards comfort care, await arrival of further family over the weekend.  He is leaning towards comfort care and withdrawal of ventilator.  He understands that quality of life would be very poor with tracheostomy even though this would be life-prolonging and chances of liberation from the vent are small  Best Practice (right click and "Reselect all SmartList Selections" daily)   Diet/type: tubefeeds DVT prophylaxis prophylactic heparin  Pressure ulcer(s): N/A GI prophylaxis: PPI Lines: Central line Foley:  N/A Code Status:  full code Last date of multidisciplinary goals of care discussion: Son updated at bedside daily  My independent critical care time was 32 minutes  Cyril Mourning MD. Tonny Bollman. [place] [facility] Pager : [phone]  If no response to pager , please call [phone] until 7 pm After 7:00 pm call Elink  [phone]    10/28/2023, 9:08 AM

## 2023-10-28 NOTE — Progress Notes (Signed)
Pt's Levo restarted d/t BP parameters not met (see vitals).

## 2023-10-28 NOTE — Plan of Care (Signed)

## 2023-10-29 DIAGNOSIS — J9622 Acute and chronic respiratory failure with hypercapnia: Secondary | ICD-10-CM | POA: Diagnosis not present

## 2023-10-29 DIAGNOSIS — J9621 Acute and chronic respiratory failure with hypoxia: Secondary | ICD-10-CM | POA: Diagnosis not present

## 2023-10-29 DIAGNOSIS — J441 Chronic obstructive pulmonary disease with (acute) exacerbation: Secondary | ICD-10-CM | POA: Diagnosis not present

## 2023-10-29 LAB — GLUCOSE, CAPILLARY
Glucose-Capillary: 136 mg/dL — ABNORMAL HIGH (ref 70–99)
Glucose-Capillary: 116 mg/dL — ABNORMAL HIGH (ref 70–99)
Glucose-Capillary: 138 mg/dL — ABNORMAL HIGH (ref 70–99)
Glucose-Capillary: 163 mg/dL — ABNORMAL HIGH (ref 70–99)
Glucose-Capillary: 143 mg/dL — ABNORMAL HIGH (ref 70–99)

## 2023-10-29 LAB — BASIC METABOLIC PANEL
Anion gap: 9 (ref 5–15)
BUN: 25 mg/dL — ABNORMAL HIGH (ref 8–23)
CO2: 37 mmol/L — ABNORMAL HIGH (ref 22–32)
Calcium: 9.4 mg/dL (ref 8.9–10.3)
Chloride: 93 mmol/L — ABNORMAL LOW (ref 98–111)
Creatinine, Ser: 0.45 mg/dL (ref 0.44–1.00)
GFR, Estimated: >60 mL/min (ref >60)
Glucose, Bld: 130 mg/dL — ABNORMAL HIGH (ref 70–99)
Potassium: 4.3 mmol/L (ref 3.5–5.1)
Sodium: 139 mmol/L (ref 135–145)

## 2023-10-29 LAB — CBC WITH DIFFERENTIAL/PLATELET
Abs Immature Granulocytes: 0.03 10*3/uL (ref 0.00–0.07)
Basophils Absolute: 0 10*3/uL (ref 0.0–0.1)
Basophils Relative: 0 %
Eosinophils Absolute: 0.2 10*3/uL (ref 0.0–0.5)
Eosinophils Relative: 2 %
HCT: 27.4 % — ABNORMAL LOW (ref 36.0–46.0)
Hemoglobin: 7.8 g/dL — ABNORMAL LOW (ref 12.0–15.0)
Immature Granulocytes: 0 %
Lymphocytes Relative: 12 %
Lymphs Abs: 1.1 10*3/uL (ref 0.7–4.0)
MCH: 28.3 pg (ref 26.0–34.0)
MCHC: 28.5 g/dL — ABNORMAL LOW (ref 30.0–36.0)
MCV: 99.3 fL (ref 80.0–100.0)
Monocytes Absolute: 1 10*3/uL (ref 0.1–1.0)
Monocytes Relative: 11 %
Neutro Abs: 7 10*3/uL (ref 1.7–7.7)
Neutrophils Relative %: 75 %
Platelets: 228 10*3/uL (ref 150–400)
RBC: 2.76 MIL/uL — ABNORMAL LOW (ref 3.87–5.11)
RDW: 15 % (ref 11.5–15.5)
WBC: 9.3 10*3/uL (ref 4.0–10.5)
nRBC: 0.2 % (ref 0.0–0.2)

## 2023-10-29 LAB — MAGNESIUM: Magnesium: 2.3 mg/dL (ref 1.7–2.4)

## 2023-10-29 LAB — PHOSPHORUS: Phosphorus: 2.9 mg/dL (ref 2.5–4.6)

## 2023-10-29 NOTE — Progress Notes (Addendum)
NAME:  Carla Rivera, MRN:  [id number], DOB:  1956-07-30, LOS: 19 ADMISSION DATE:  10/09/2023, CONSULTATION DATE:  1/7 REFERRING MD:  Dr. 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 Meredeth Ide.  Her home regimen is composed of Symbicort and scheduled DuoNebs.  She was in her usual state of health until approximately 1/4 when her baseline dyspnea worsened gradually causing her to present to med [facility] on 1/6 with these complaints.  She has been around grandchildren who have had cold symptoms recently.  Upon arrival to the med center her O2 sats were in the high 70s which did improve when oxygen was increased to 6 L to 95%.  There was concern for COPD exacerbation and she was treated with the usual modalities including steroids, nebulized bronchodilators, magnesium, and antibiotics.  Chest radiograph concerning for pneumonia.  Despite the aforementioned treatment her condition continued to worsen as did her oxygen requirement as well.  Ultimately she required BiPAP which she was unfortunately able to tolerate.  She was transferred ED to ED to [facility] where PCCM was asked to admit to ICU.  Pertinent  Medical History   has a past medical history of Arthritis, COPD (chronic obstructive pulmonary disease) (HCC), Depression, and HTN (hypertension).   Significant Hospital Events: Including procedures, antibiotic start and stop dates in addition to other pertinent events   1/7 Admitted with resp failure, intubated, PNA, shock 1/9 failed wean completed 3d azithro  1/10 failed wean again  1/12 Needed increased sedation late shift yesterday and overnight. Some associated hypotension, now off pressors again  1/13 no acute events overnight, remains on low-dose fentanyl, intubated. Almost self- extubated   1/16 CVC not working 2/3 lumens. Tmax 100.4. 1/17 tolerated SBT this a.m. 5/12 for 1 hour prior to tachycardia and increased work of breathing 1/20 incr sedation needs. Failed SBT 1/21 incr sedation needs again. 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  Son did not like [facility] facility and would like to tour [facility]  1/22 following commands, less delirium  1/23 Weaned on pressure support 10/5 and then got very tired by evening requiring increased sedation 1/25 Episode of respiratory distress ,, low tidal volumes on vent, changed to PRVC. Interim History / Subjective:   Critically ill Back on Levophed 2 L urine output   Objective   Blood pressure 108/75, pulse (!) 112, temperature 97.6 F (36.4 C), temperature source Axillary, resp. rate (!) 24, height 5' (1.524 m), weight 53.4 kg, SpO2 98%.    Vent Mode: PRVC FiO2 (%):  [40 %] 40 % Set Rate:  [18 bmp] 18 bmp Vt Set:  [360 mL] 360 mL PEEP:  [5 cmH20] 5 cmH20 Plateau Pressure:  [17 cmH20-24 cmH20] 18 cmH20   Intake/Output Summary (Last 24 hours) at 10/29/2023 0949 Last data filed at 10/29/2023 0800 Gross per 24 hour  Intake 1972.17 ml  Output 2130 ml  Net -157.83 ml   Filed Weights   10/27/23 0500 10/28/23 0423 10/29/23 0500  Weight: 53.3 kg 54.1 kg 53.4 kg    Examination: General: acute on chronic ill appearing older female on ICU bed, on vent, frail HEENT: Normocephalic, poor dentition/missing teeth, ETT, cortrak, facial pressure injury-left cheek Pulm:  Bilateral ventilated breath sounds, mild accessory muscle use Cards: s1,s2 tacky, regular ABS: soft, active, cortrak  Extremities: No deformity, 1+ edema Skin: Anasarca, left facial pressure injury  Neuro: RASS 0, does not follow commands, appears weak and tired  Labs show normal electrolytes, stable anemia, no leukocytosis ABG showed compensated  hypercarbia Chest x-ray 1/25 hyperaeration without focal infiltrates  Resolved Hospital Problem list   Shock  Hypotension  Assessment & Plan:   AoC hypoxic and hypercarbic resp failure AECOPD, underlying end stage COPD Pneumococcal PNA, Rhinovirus and Coronavirus OC43 infections, POA  -completed 5d rocephin 3 day azithro starting at admission. -Completed 5 days of zosyn 1/20 empiric, trach asp neg Not making significant progress with SBTs in 2weeks. P: Maintain  10 mg prednisone Budesonide/Brovana and Yupelri combination Continue VAP and PAD protocol -Precedex and fentanyl goal RASS 0 to -1 Has failed SBT trials-discussion of trach vs comfort measures-son leaning towards comfort   Sedation/auto PEEP related hypotension Hx  HTN  Intermittently requiring Levophed P Continue to hold antihypertensives Back on Levophed  Hyperglycemia  On SSI P; Continue SSI Continue CBG q4  Continue feeding via cortrak   AoC anemia -Critical illness related P Continue to trend CBC, h/h  Monitor for signs of bleeding Transfuse hgb <7   Severe protein calorie malnutrition  P: Continue tube feeds per cortrak  urinary retention P: Continue foley, monitor urine output  Continue urecholine   Hypervolemia  -based on I's and O's, euvolemic on exam Still 15 liters positive  Responds well to diuretic but ends up requiring pressors P: Hold Lasix  Pressure injury, face  -under ETT holder Current one has been padded. Difficult to address this one, has been ETT x 14 days with limited options to physically move tube holders around on the face.  P: Continue wound care as able  GOC - when we started GOC a week and a half ago pt was against a trach, quite adamantly. 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  -- son did not like [facility] facility.  He visited [facility] P: Based on last discussion with son,  He is leaning towards comfort care and withdrawal of  ventilator.  He understands that quality of life would be very poor with tracheostomy even though this would be life-prolonging and chances of liberation from the vent are small.  He will discuss with his brother and sister will arrive from [place] this weekend and plan to visit.  She has made virtually no progress over the last few days and even with tracheostomy feels she will remain vent dependent with very poor quality of life  Addendum -spoke to [place].  We will issue no CPR, no cardioversion orders.  Continue mechanical ventilation and medical care otherwise.  He is waiting on family to visit today and considering withdrawal of life support tomorrow  Best Practice (right click and "Reselect all SmartList Selections" daily)   Diet/type: tubefeeds DVT prophylaxis prophylactic heparin  Pressure ulcer(s): N/A GI prophylaxis: PPI Lines: Central line Foley:  N/A Code Status:  full code Last date of multidisciplinary goals of care discussion: Son updated at bedside daily  My independent critical care time was 31 minutes  Cyril Mourning MD. Tonny Bollman. [place] [facility] Pager : [phone]  If no response to pager , please call [phone] until 7 pm After 7:00 pm call Elink  [phone]    10/29/2023, 9:49 AM

## 2023-10-30 LAB — GLUCOSE, CAPILLARY
Glucose-Capillary: 141 mg/dL — ABNORMAL HIGH (ref 70–99)
Glucose-Capillary: 132 mg/dL — ABNORMAL HIGH (ref 70–99)
Glucose-Capillary: 138 mg/dL — ABNORMAL HIGH (ref 70–99)

## 2023-10-30 MED ORDER — MIDAZOLAM HCL 2 MG/2ML IJ SOLN
1.0000 mg | INTRAMUSCULAR | Status: DC | PRN
  Administered 2023-10-30 (×2): 2 mg via INTRAVENOUS
  Filled 2023-10-30: qty 2

## 2023-10-30 MED ORDER — FENTANYL BOLUS VIA INFUSION: 100.0000 ug | INTRAVENOUS | Status: DC | PRN

## 2023-10-30 MED ORDER — GLYCOPYRROLATE 0.2 MG/ML IJ SOLN: 0.2000 mg | INTRAMUSCULAR | Status: DC | PRN

## 2023-10-30 MED ORDER — POLYVINYL ALCOHOL 1.4 % OP SOLN: 1.0000 [drp] | Freq: Four times a day (QID) | OPHTHALMIC | Status: DC | PRN

## 2023-10-30 MED ORDER — GLYCOPYRROLATE 1 MG PO TABS: 1.0000 mg | ORAL_TABLET | ORAL | Status: DC | PRN

## 2023-10-30 MED ORDER — FENTANYL 2500MCG IN NS 250ML (10MCG/ML) PREMIX INFUSION: 0.0000 ug/h | INTRAVENOUS | Status: DC

## 2023-10-30 MED ORDER — GLYCOPYRROLATE 0.2 MG/ML IJ SOLN
0.2000 mg | INTRAMUSCULAR | Status: DC | PRN
  Administered 2023-10-30: 0.2 mg via INTRAVENOUS
  Filled 2023-10-30: qty 1

## 2023-10-30 MED ORDER — MIDAZOLAM HCL 2 MG/2ML IJ SOLN: 1.0000 mg | INTRAMUSCULAR | Status: DC | PRN

## 2023-10-30 MED ORDER — MIDAZOLAM HCL 2 MG/2ML IJ SOLN
INTRAMUSCULAR | Status: AC
  Filled 2023-10-30: qty 2

## 2023-11-04 NOTE — Discharge Summary (Signed)
DEATH SUMMARY   Patient Details  Name: Carla Rivera MRN: [id number] DOB: [birth year]  Admission/Discharge Information   Admit Date:  Oct 23, 2023  Date of Death: Date of Death: November 13, 2023  Time of Death: Time of Death: 11/20/1305  Length of Stay: 2023-12-07  Referring Physician: Jeani Sow, MD   Reason(s) for Hospitalization  Respiratory failure  Diagnoses  Preliminary cause of death: community acquired pneumonia Secondary Diagnoses (including complications and co-morbidities):  Principal Problem:   Community acquired pneumonia Active Problems:   Acute hypoxic respiratory failure (HCC)   Severe protein-calorie malnutrition (HCC)   End stage COPD (HCC)   Pneumonia due to Streptococcus pneumoniae (HCC)   Goals of care, counseling/discussion   Hypotension due to drugs   Pressure injury of skin   Urinary retention   Brief Hospital Course (including significant findings, care, treatment, and services provided and events leading to death)   Carla Rivera is a 68 y.o. year old female who has chronic respiratory failure due to COPD on home O2 2L/min.  She presented with increasing dyspnea and eventually required intubation. She was treated with antibiotics, steroids and bronchodilators. She improved to the point of starting SBT's but she consistently failed SBT's. Given that the patient as a result of her chronic respiratory failure would require prolonged weaning and possible long-term mechanical ventilation and tracheostomy placement. Such a protracted course was deemed not consistent with her wishes and she was transitioned to comfort care.   Pertinent Labs and Studies  Significant Diagnostic Studies DG Chest Port 1 View Result Date: 10/28/2023 CLINICAL DATA:  Acute respiratory failure EXAM: PORTABLE CHEST 1 VIEW COMPARISON:  Chest x-ray 10/23/2023 FINDINGS: Emphysematous changes are again seen. Focal opacity in the right costophrenic angle is unchanged. There are additional patchy and  strandy opacities in both lower lungs, similar to prior. No pleural effusion or pneumothorax. Cardiomediastinal silhouette is within normal limits. Left-sided central venous catheter tip projects over the SVC. Endotracheal tube tip is 5.3 cm above the carina. Enteric tube tip is in the gastric fundus. Osseous structures are stable. IMPRESSION: 1. No significant interval change. 2. Stable support apparatus. 3. Emphysematous changes. 4. Stable focal opacity in the right costophrenic angle. Electronically Signed   By: Darliss Cheney M.D.   On: 10/28/2023 02:25   DG Chest Port 1 View Result Date: 10/23/2023 CLINICAL DATA:  Pneumonia EXAM: PORTABLE CHEST - 1 VIEW COMPARISON:  10/18/2023 FINDINGS: Endotracheal tube tip 4.4 cm above carina. Feeding tube loops in the stomach, tip near the fundus. Stable left subclavian central line to the proximal SVC. No pneumothorax. Markedly attenuated upper lung bronchovascular markings. Coarse bibasilar interstitial opacities. Small right effusion with some adjacent atelectasis. Heart size and mediastinal contours are within normal limits. Visualized bones unremarkable. IMPRESSION: 1. Support apparatus as above. 2. Small right effusion with adjacent atelectasis. Electronically Signed   By: Corlis Leak M.D.   On: 10/23/2023 10:16   DG CHEST PORT 1 VIEW Result Date: 10/18/2023 CLINICAL DATA:  Acute respiratory failure, hypoxia EXAM: PORTABLE CHEST 1 VIEW COMPARISON:  10/16/2023 FINDINGS: Mild patchy bilateral lower lobe opacities, grossly unchanged. No definite pleural effusions. No pneumothorax Heart is normal in size. Left subclavian venous catheter terminates in the upper SVC. Endotracheal tube terminates 7 cm above the carina. Enteric tube courses below the diaphragm. IMPRESSION: Mild patchy bilateral lower lobe opacities, suspicious for pneumonia, grossly unchanged. Support apparatus as above. Electronically Signed   By: Charline Bills M.D.   On: 10/18/2023 23:20   DG CHEST  PORT 1 VIEW Result Date: 10/16/2023 CLINICAL DATA:  Respiratory compromise.  PICC in place. EXAM: PORTABLE CHEST 1 VIEW COMPARISON:  Radiograph earlier today FINDINGS: Left subclavian central line tip overlies the upper SVC. No pneumothorax. Endotracheal tube remains in place. Weighted enteric tube tip below the diaphragm not included in the field of view. Emphysema. There are increasing patchy opacity in both lung bases with possible right pleural effusion. Stable heart size and mediastinal contours. IMPRESSION: 1. Left subclavian central line tip overlies the upper SVC. No pneumothorax. 2. Increasing patchy opacity in both lung bases with possible right pleural effusion. 3. Emphysema. Electronically Signed   By: Narda Rutherford M.D.   On: 10/16/2023 22:40   DG CHEST PORT 1 VIEW Result Date: 10/16/2023 CLINICAL DATA:  Airway obstruction EXAM: PORTABLE CHEST 1 VIEW COMPARISON:  X-ray 10/11/2023. FINDINGS: Hyperinflation. Enteric tube with tip overlying the fundus of the stomach. ET tube in place with the tip proximally 4 cm above the carina. This has been retracted. Hyperinflation. Tiny right pleural effusion with adjacent opacity. Extensive chronic lung changes. No pneumothorax. Normal cardiopericardial silhouette. Overlapping cardiac leads. IMPRESSION: ET tube now seen with the tip 4 cm above the carina. Electronically Signed   By: Karen Kays M.D.   On: 10/16/2023 15:38   DG Abd Portable 1V Result Date: 10/11/2023 CLINICAL DATA:  Feeding tube placement EXAM: PORTABLE ABDOMEN - 1 VIEW COMPARISON:  10/11/2023 FINDINGS: Enteric tube tip looped within the stomach, tip positioned at the gastric cardia region. Pleural and parenchymal disease at the right base. IMPRESSION: Enteric tube tip looped within the stomach, tip directed cranially in the region of the gastric cardia. Electronically Signed   By: Jasmine Pang M.D.   On: 10/11/2023 17:16   ECHOCARDIOGRAM COMPLETE Result Date: 10/11/2023    ECHOCARDIOGRAM  REPORT   Patient Name:   Carla Rivera Date of Exam: 10/11/2023 Medical Rec #:  [id number]        Height:       60.0 in Accession #:    [id number]       Weight:       106.9 lb Date of Birth:  July 17, 1956        BSA:          1.430 m Patient Age:    67 years         BP:           100/69 mmHg Patient Gender: F                HR:           107 bpm. Exam Location:  Inpatient Procedure: 2D Echo, Cardiac Doppler and Color Doppler Indications:    Shock R57.9  History:        Patient has prior history of Echocardiogram examinations, most                 recent 03/18/2021. COPD; Risk Factors:Hypertension.  Sonographer:    Lucendia Herrlich RCS Referring Phys: [id number] PAULA B SIMPSON IMPRESSIONS  1. Left ventricular ejection fraction, by estimation, is 55 to 60%. The left ventricle has normal function. The left ventricle has no regional wall motion abnormalities. Left ventricular diastolic parameters are indeterminate.  2. Right ventricular systolic function is normal. The right ventricular size is normal.  3. A small pericardial effusion is present.  4. Trivial mitral valve regurgitation.  5. The aortic valve is tricuspid. Aortic valve regurgitation is not visualized.  6. The inferior vena cava is  normal in size with greater than 50% respiratory variability, suggesting right atrial pressure of 3 mmHg. FINDINGS  Left Ventricle: Left ventricular ejection fraction, by estimation, is 55 to 60%. The left ventricle has normal function. The left ventricle has no regional wall motion abnormalities. The left ventricular internal cavity size was normal in size. There is  no left ventricular hypertrophy. Left ventricular diastolic parameters are indeterminate. Right Ventricle: The right ventricular size is normal. Right ventricular systolic function is normal. Left Atrium: Left atrial size was normal in size. Right Atrium: Right atrial size was normal in size. Pericardium: A small pericardial effusion is present. Mitral Valve: Trivial  mitral valve regurgitation. Tricuspid Valve: Tricuspid valve regurgitation is trivial. Aortic Valve: The aortic valve is tricuspid. Aortic valve regurgitation is not visualized. Aortic valve peak gradient measures 2.1 mmHg. Pulmonic Valve: Pulmonic valve regurgitation is mild. Aorta: The aortic root and ascending aorta are structurally normal, with no evidence of dilitation. Venous: The inferior vena cava is normal in size with greater than 50% respiratory variability, suggesting right atrial pressure of 3 mmHg. IAS/Shunts: No atrial level shunt detected by color flow Doppler.  LEFT VENTRICLE PLAX 2D LVIDd:         3.20 cm   Diastology LVIDs:         2.20 cm   LV e' medial:    4.90 cm/s LV PW:         0.50 cm   LV E/e' medial:  8.8 LV IVS:        0.40 cm   LV e' lateral:   6.09 cm/s LVOT diam:     1.90 cm   LV E/e' lateral: 7.1 LV SV:         31 LV SV Index:   21 LVOT Area:     2.84 cm  RIGHT VENTRICLE         IVC TAPSE (M-mode): 1.1 cm  IVC diam: 1.20 cm LEFT ATRIUM         Index       RIGHT ATRIUM          Index LA diam:    1.80 cm 1.26 cm/m  RA Area:     8.05 cm                                 RA Volume:   16.10 ml 11.26 ml/m  AORTIC VALVE AV Area (Vmax): 2.66 cm AV Vmax:        72.50 cm/s AV Peak Grad:   2.1 mmHg LVOT Vmax:      68.02 cm/s LVOT Vmean:     42.360 cm/s LVOT VTI:       0.108 m  AORTA Ao Root diam: 3.10 cm MITRAL VALVE               TRICUSPID VALVE MV Area (PHT): 5.42 cm    TR Peak grad:   14.1 mmHg MV Decel Time: 140 msec    TR Vmax:        188.00 cm/s MV E velocity: 43.20 cm/s MV A velocity: 90.60 cm/s  SHUNTS MV E/A ratio:  0.48        Systemic VTI:  0.11 m                            Systemic Diam: 1.90 cm Carolan Clines Electronically signed by Carolan Clines  Signature Date/Time: 10/11/2023/2:44:49 PM    Final    DG Abd Portable 1V Result Date: 10/11/2023 CLINICAL DATA:  Feeding tube placement EXAM: PORTABLE ABDOMEN - 1 VIEW COMPARISON:  10/10/2023 FINDINGS: The feeding tube is coiled in the  stomach, its tip is currently in the vicinity of the gastric cardia. Prominence of stool in the visualized colon. Blunting of the costophrenic angles with bandlike scarring or atelectasis peripherally at the right lung base. Confluent interstitial opacity peripherally at the right lung base as well. IMPRESSION: 1. The feeding tube is coiled in the stomach, its tip is currently in the vicinity of the gastric cardia. 2. Prominence of stool in the visualized colon. 3. Blunting of the costophrenic angles with bandlike scarring or atelectasis peripherally at the right lung base. Confluent interstitial opacity peripherally at the right lung base as well. Electronically Signed   By: Gaylyn Rong M.D.   On: 10/11/2023 11:01   DG Chest Port 1 View Result Date: 10/11/2023 CLINICAL DATA:  Respiratory failure. EXAM: PORTABLE CHEST 1 VIEW COMPARISON:  Chest radiograph dated 10/10/2023. FINDINGS: Relatively similar positioning of the endotracheal tube just above the carina and tilting towards the right mainstem bronchus. Recommend retraction by 3 cm. No significant interval change in bilateral lower lung field airspace opacities. Background of emphysema. No pneumothorax. Stable cardiomediastinal silhouette. No acute osseous pathology. IMPRESSION: 1. Endotracheal tube just above the carina. Recommend retraction by 3 cm. 2. No significant interval change in bilateral lower lung field airspace opacities. Electronically Signed   By: Elgie Collard M.D.   On: 10/11/2023 09:36   DG Abd 1 View Result Date: 10/10/2023 CLINICAL DATA:  Respiratory failure and pneumonia. Interval advancement of the endotracheal tube. EXAM: ABDOMEN - 1 VIEW; PORTABLE CHEST - 1 VIEW COMPARISON:  Earlier radiograph dated 10/10/2023. FINDINGS: Interval advancement of the endotracheal tube with tip at the level of the carina tilting towards the right mainstem bronchus. Recommend retraction by approximately 3 cm. Enteric tube extends below the  diaphragm with tip in the region of the body of the stomach. Bilateral lower lung field infiltrates as seen on the prior radiograph. No pleural effusion or pneumothorax. Background of emphysema. The cardiac silhouette is within limits. No acute osseous pathology. IMPRESSION: 1. Endotracheal tube tip at the level of the carina or origin of the right mainstem bronchus. Recommend retraction by approximately 3 cm. 2. Enteric tube with tip in the region of the body of the stomach. 3. Bilateral lower lung field infiltrates. These results will be called to the ordering clinician or representative by the Radiologist Assistant, and communication documented in the PACS or [employer]. Electronically Signed   By: Elgie Collard M.D.   On: 10/10/2023 09:35   DG CHEST PORT 1 VIEW Result Date: 10/10/2023 CLINICAL DATA:  Respiratory failure and pneumonia. Interval advancement of the endotracheal tube. EXAM: ABDOMEN - 1 VIEW; PORTABLE CHEST - 1 VIEW COMPARISON:  Earlier radiograph dated 10/10/2023. FINDINGS: Interval advancement of the endotracheal tube with tip at the level of the carina tilting towards the right mainstem bronchus. Recommend retraction by approximately 3 cm. Enteric tube extends below the diaphragm with tip in the region of the body of the stomach. Bilateral lower lung field infiltrates as seen on the prior radiograph. No pleural effusion or pneumothorax. Background of emphysema. The cardiac silhouette is within limits. No acute osseous pathology. IMPRESSION: 1. Endotracheal tube tip at the level of the carina or origin of the right mainstem bronchus. Recommend retraction by approximately 3  cm. 2. Enteric tube with tip in the region of the body of the stomach. 3. Bilateral lower lung field infiltrates. These results will be called to the ordering clinician or representative by the Radiologist Assistant, and communication documented in the PACS or [employer]. Electronically Signed   By: Elgie Collard M.D.   On: 10/10/2023 09:35   DG Chest Portable 1 View Result Date: 10/10/2023 CLINICAL DATA:  Post intubation, OG tube placement EXAM: PORTABLE CHEST 1 VIEW COMPARISON:  Seven hundred twenty-five FINDINGS: Endotracheal tube is 5 cm above the carina. OG tube tip is in the distal esophagus. Continued consolidation in the lower lungs, right greater than left. No visible effusions or mid thorax. Underlying COPD. IMPRESSION: Endotracheal tube 5 cm above the carina. OG tube tip in the distal esophagus. Recommend advancing several cm. Bilateral lower lobe lung airspace opacities, right greater than left, unchanged. Electronically Signed   By: Charlett Nose M.D.   On: 10/10/2023 02:38   DG Chest Portable 1 View Result Date: 10/10/2023 CLINICAL DATA:  Check endotracheal tube placement EXAM: PORTABLE CHEST 1 VIEW COMPARISON:  Film from the previous day. FINDINGS: Endotracheal tube is noted 1 cm above the carina. This could be withdrawn 1-2 cm. Cardiac shadow is within normal limits. Emphysematous changes are seen. Right middle lobe infiltrate is again identified stable from the prior exam. Bony structures are within normal limits. IMPRESSION: Stable right middle lobe infiltrate. Endotracheal tube just above the carina. This should be withdrawn slightly. Electronically Signed   By: Alcide Clever M.D.   On: 10/10/2023 01:11   DG Chest 2 View Result Date: 10/09/2023 CLINICAL DATA:  Cough and hypoxia.  Shortness of breath for 3 days EXAM: CHEST - 2 VIEW COMPARISON:  X-ray 04/22/2020, 04/09/2023. FINDINGS: Hyperinflation with chronic lung changes. There is new areas of consolidative opacity in the right lung base with a question tiny right effusion, more middle lobe than lower lobe. More mild changes at the left lung base. Possible infiltrate or pneumonia. Recommend follow-up. No pneumothorax or edema. Normal cardiopericardial silhouette. Overlapping cardiac leads. IMPRESSION: Consolidative opacity seen in the right  lung base with a small right effusion. More in the middle lobe. Subtle opacity left lung base as well. Multifocal infiltrate or pneumonia is possible. Recommend follow up to confirm clearance. Electronically Signed   By: Karen Kays M.D.   On: 10/09/2023 17:56    Microbiology No results found for this or any previous visit (from the past 240 hours).  Lab Basic Metabolic Panel: Recent Labs  Lab 10/25/23 0415 10/26/23 1134 10/27/23 0411 10/28/23 0301 10/29/23 0456  NA 135  --  142 138 139  K 4.0  --  4.9 4.1 4.3  CL 96*  --  96*  --  93*  CO2 34*  --  41*  --  37*  GLUCOSE 111*  --  123*  --  130*  BUN 15  --  27*  --  25*  CREATININE 0.45  --  0.57  --  0.45  CALCIUM 8.6*  --  9.4  --  9.4  MG 2.0 2.1 2.4  --  2.3  PHOS 2.9  --   --   --  2.9   Liver Function Tests: No results for input(s): "AST", "ALT", "ALKPHOS", "BILITOT", "PROT", "ALBUMIN" in the last 168 hours. No results for input(s): "LIPASE", "AMYLASE" in the last 168 hours. No results for input(s): "AMMONIA" in the last 168 hours. CBC: Recent Labs  Lab 10/25/23  7846 10/27/23 0411 10/28/23 0301 10/29/23 0456  WBC 8.6 10.6*  --  9.3  NEUTROABS 6.6  --   --  7.0  HGB 7.8* 7.9* 8.8* 7.8*  HCT 27.2* 28.4* 26.0* 27.4*  MCV 98.6 102.9*  --  99.3  PLT 325 264  --  228   Cardiac Enzymes: No results for input(s): "CKTOTAL", "CKMB", "CKMBINDEX", "TROPONINI" in the last 168 hours. Sepsis Labs: Recent Labs  Lab 10/25/23 0415 10/27/23 0411 10/29/23 0456  WBC 8.6 10.6* 9.3    Procedures/Operations  Mechanical ventilation.   Torre Schaumburg 10/31/2023, 10:31 AM

## 2023-11-04 NOTE — Progress Notes (Signed)
This chaplain responded to NP-Joshua consult for EOL spiritual care after compassionate extubation. The Pt. is resting comfortably.   The Pt. son-Jamie, daughter-Tasha, and friend-Mary are at the bedside during her last breaths. The chaplain listened to the stories of the Pt. compassionate presence with family and friends.  The Pt. strength continued to weave in and out of the stories.  Carla Rivera accepted the chaplain's invitation for prayer at the time of death.  Chaplain Stephanie Acre [phone]

## 2023-11-04 NOTE — Progress Notes (Signed)
Pt died with family at bedside. Time of death 13:07. No electrical activity, no respiratory activity, no pulse or heart sounds. MD notified of TOD. Belongings home with son, Asher Muir. Son will call with name of funeral home.

## 2023-11-04 NOTE — Progress Notes (Signed)
NAME:  Carla Rivera, MRN:  [id number], DOB:  [birth year], LOS: 20 ADMISSION DATE:  10/09/2023, CONSULTATION DATE:  1/7 REFERRING MD:  Dr. Manus Gunning EDP, CHIEF COMPLAINT:  Respiratory failure   History of Present Illness:  68 year old female with past medical history as below, which is significant for end-stage COPD on home O2 2 L followed at [facility] clinic in [place] by Dr. Meredeth Ide.  Her home regimen is composed of Symbicort and scheduled DuoNebs.  She was in her usual state of health until approximately 1/4 when her baseline dyspnea worsened gradually causing her to present to med [facility] on 1/6 with these complaints.  She has been around grandchildren who have had cold symptoms recently.  Upon arrival to the med center her O2 sats were in the high 70s which did improve when oxygen was increased to 6 L to 95%.  There was concern for COPD exacerbation and she was treated with the usual modalities including steroids, nebulized bronchodilators, magnesium, and antibiotics.  Chest radiograph concerning for pneumonia.  Despite the aforementioned treatment her condition continued to worsen as did her oxygen requirement as well.  Ultimately she required BiPAP which she was unfortunately able to tolerate.  She was transferred ED to ED to [facility] where PCCM was asked to admit to ICU.  Pertinent  Medical History   has a past medical history of Arthritis, COPD (chronic obstructive pulmonary disease) (HCC), Depression, and HTN (hypertension).   Significant Hospital Events: Including procedures, antibiotic start and stop dates in addition to other pertinent events   1/7 Admitted with resp failure, intubated, PNA, shock 1/9 failed wean completed 3d azithro  1/10 failed wean again  1/12 Needed increased sedation late shift yesterday and overnight. Some associated hypotension, now off pressors again  1/13 no acute events overnight, remains on low-dose fentanyl, intubated. Almost self- extubated   1/16 CVC not working 2/3 lumens. Tmax 100.4. 1/17 tolerated SBT this a.m. 5/12 for 1 hour prior to tachycardia and increased work of breathing 1/20 incr sedation needs. Failed SBT 1/21 incr sedation needs again. 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  Son did not like [facility] facility and would like to tour select  1/22 following commands, less delirium  1/23 Weaned on pressure support 10/5 and then got very tired by evening requiring increased sedation 1/25 Episode of respiratory distress ,, low tidal volumes on vent, changed to PRVC. 1/27 Transitioning to comfort  Interim History / Subjective:  Intubated, sedated, on levophed    Objective   Blood pressure (!) 114/97, pulse 93, temperature (!) 97.5 F (36.4 C), temperature source Axillary, resp. rate (!) 23, height 5' (1.524 m), weight 53.2 kg, SpO2 100%.    Vent Mode: PRVC FiO2 (%):  [40 %] 40 % Set Rate:  [18 bmp] 18 bmp Vt Set:  [360 mL] 360 mL PEEP:  [5 cmH20] 5 cmH20 Plateau Pressure:  [17 cmH20-23 cmH20] 22 cmH20   Intake/Output Summary (Last 24 hours) at 10/21/2023 1058 Last data filed at 10/18/2023 0950 Gross per 24 hour  Intake 1787.64 ml  Output 1800 ml  Net -12.36 ml   Filed Weights   10/28/23 0423 10/29/23 0500 10/11/2023 0500  Weight: 54.1 kg 53.4 kg 53.2 kg    Examination: General: acute on chronic critically ill female, on vent, lying on ICU bed HEENT: normocephalic, ETT, OG, poor dentition, pink MM Lungs: Clear, diminished throughout  lung bases Cards: s1,s2, RRR, no JVD, no MRG, hypotensive on levophed Abs: soft, bs active Extremities: moves extremities to painful stimuli  Neuro: RASS -2, responds to painful stimuli  GU: foley   Resolved Hospital Problem list   Shock  Hypotension  Assessment & Plan:   GOC 1/26 no CPR, no cardioversion orders.  Continue mechanical ventilation and  medical care otherwise. Plan to transition to comfort care 1/27  P: Discussed with son Carla Rivera) at beside about plans for comfort care. Carla Rivera in agreement with proceeding with comfort care. Continued to discuss comfort care transition process with Carla Rivera at beside. Carla Rivera verbalized understanding. Currently waiting on other family members to visit prior to removing ETT.  Continue fentanyl and precedex post extubation  Comfort care orders placed, follow, dc other orders not related to comfort care interventions. Remove ETT once family is ready to proceed with comfort care transition.  Call chaplain services to beside   AoC hypoxic and hypercarbic resp failure AECOPD, underlying end stage COPD Pneumococcal PNA, Rhinovirus and Coronavirus OC43 infections, POA  Sedation/auto PEEP related hypotension HTN hx Hyperglycemia  Acute on Chronic Anemia  Severe protein calorie malnutrition  Urinary Retention  Hypervolemia  Pressure injury, face    Best Practice (right click and "Reselect all SmartList Selections" daily)  -Comfort care   Hazel Sams AGACNP-BC   [facility] 10/20/2023, 1:20 PM  Please see [email or web address] for pager details.  From 7A-7P if no response, please call [phone]. After hours, please call ELink [phone].

## 2023-11-04 NOTE — Progress Notes (Addendum)
Pt extubated to comfort care per MD order.  RN at bedside.

## 2023-11-04 NOTE — Progress Notes (Signed)
Pt's son Asher Muir is ready to extubate. RT notified. NP Whitney notified.

## 2023-11-04 DEATH — deceased
